# Patient Record
Sex: Female | Born: 1955 | Hispanic: No | Marital: Married | Smoking: Never smoker
Health system: Southern US, Community
[De-identification: ages and names within clinical notes are randomized; demographics above are authoritative.]

## PROBLEM LIST (undated history)

## (undated) DIAGNOSIS — C801 Malignant (primary) neoplasm, unspecified: Secondary | ICD-10-CM

## (undated) DIAGNOSIS — M1990 Unspecified osteoarthritis, unspecified site: Secondary | ICD-10-CM

## (undated) DIAGNOSIS — I10 Essential (primary) hypertension: Secondary | ICD-10-CM

## (undated) DIAGNOSIS — M797 Fibromyalgia: Secondary | ICD-10-CM

## (undated) DIAGNOSIS — E039 Hypothyroidism, unspecified: Secondary | ICD-10-CM

## (undated) DIAGNOSIS — I4891 Unspecified atrial fibrillation: Secondary | ICD-10-CM

## (undated) DIAGNOSIS — D649 Anemia, unspecified: Secondary | ICD-10-CM

## (undated) HISTORY — PX: DILATION AND CURETTAGE OF UTERUS: SHX78

## (undated) HISTORY — PX: WISDOM TOOTH EXTRACTION: SHX21

## (undated) HISTORY — PX: OTHER SURGICAL HISTORY: SHX169

## (undated) HISTORY — PX: COLONOSCOPY: SHX174

## (undated) HISTORY — DX: Unspecified atrial fibrillation: I48.91

---

## 2001-10-31 ENCOUNTER — Encounter: Payer: Self-pay | Admitting: Obstetrics and Gynecology

## 2001-10-31 ENCOUNTER — Ambulatory Visit (HOSPITAL_COMMUNITY): Admission: RE | Admit: 2001-10-31 | Discharge: 2001-10-31 | Payer: Self-pay | Admitting: Obstetrics and Gynecology

## 2002-02-26 ENCOUNTER — Other Ambulatory Visit: Admission: RE | Admit: 2002-02-26 | Discharge: 2002-02-26 | Payer: Self-pay | Admitting: Obstetrics and Gynecology

## 2002-04-04 ENCOUNTER — Ambulatory Visit (HOSPITAL_COMMUNITY): Admission: RE | Admit: 2002-04-04 | Discharge: 2002-04-04 | Payer: Self-pay | Admitting: Obstetrics and Gynecology

## 2002-04-04 ENCOUNTER — Encounter (INDEPENDENT_AMBULATORY_CARE_PROVIDER_SITE_OTHER): Payer: Self-pay | Admitting: *Deleted

## 2007-06-14 ENCOUNTER — Encounter: Admission: RE | Admit: 2007-06-14 | Discharge: 2007-06-14 | Payer: Self-pay | Admitting: Family Medicine

## 2007-12-20 ENCOUNTER — Encounter: Admission: RE | Admit: 2007-12-20 | Discharge: 2007-12-20 | Payer: Self-pay | Admitting: Orthopedic Surgery

## 2008-11-04 ENCOUNTER — Encounter: Admission: RE | Admit: 2008-11-04 | Discharge: 2008-11-04 | Payer: Self-pay | Admitting: Gastroenterology

## 2008-11-12 ENCOUNTER — Ambulatory Visit: Payer: Self-pay | Admitting: Gastroenterology

## 2008-12-29 ENCOUNTER — Encounter (INDEPENDENT_AMBULATORY_CARE_PROVIDER_SITE_OTHER): Payer: Self-pay | Admitting: Interventional Radiology

## 2008-12-29 ENCOUNTER — Ambulatory Visit (HOSPITAL_COMMUNITY): Admission: RE | Admit: 2008-12-29 | Discharge: 2008-12-29 | Payer: Self-pay | Admitting: Gastroenterology

## 2009-01-12 ENCOUNTER — Ambulatory Visit: Payer: Self-pay | Admitting: Gastroenterology

## 2009-11-23 ENCOUNTER — Encounter: Admission: RE | Admit: 2009-11-23 | Discharge: 2009-11-23 | Payer: Self-pay | Admitting: Family Medicine

## 2010-09-25 ENCOUNTER — Encounter: Payer: Self-pay | Admitting: Gastroenterology

## 2010-09-25 ENCOUNTER — Encounter: Payer: Self-pay | Admitting: Family Medicine

## 2010-12-14 LAB — CBC
HCT: 41.8 % (ref 36.0–46.0)
Hemoglobin: 13.7 g/dL (ref 12.0–15.0)
MCHC: 32.9 g/dL (ref 30.0–36.0)
MCV: 88.1 fL (ref 78.0–100.0)
RDW: 13.9 % (ref 11.5–15.5)

## 2011-01-20 NOTE — Op Note (Signed)
   NAME:  Whitney Daniel, Whitney Daniel                      ACCOUNT NO.:  [id number]   MEDICAL RECORD NO.:  [id number]                   PATIENT TYPE:  AMB   LOCATION:  [facility]C                                  FACILITY:  [facility]   PHYSICIAN:  Sheronette A. Cherly Hensen, M.D.         DATE OF BIRTH:  July 22, 1956   DATE OF PROCEDURE:  04/04/2002  DATE OF DISCHARGE:  04/04/2002                                 OPERATIVE REPORT   PREOPERATIVE DIAGNOSIS:  Menorrhagia and endometrial mass.   POSTOPERATIVE DIAGNOSIS:  1. Menorrhagia.  2. Submucosal fibroid.  3. Endometrial polyp.   PROCEDURE:  Diagnostic hysteroscopy, resection of endometrial polyp and  submucosal fibroid.  Dilation and curettage.   SURGEON:  Sheronette A. Cherly Hensen, M.D.   ANESTHESIA:  General.   DESCRIPTION OF PROCEDURE:  Under adequate anesthesia, the patient was placed  in the dorsal lithotomy position.  She was sterilely prepped and draped in  the usual fashion.  The bladder was catheterized of a moderate amount of  urine.  Examination under anesthesia revealed an anteverted average size  uterus.  No unusual masses could be appreciated.  Bivalve speculum was  placed in the vagina.  Cervix was noted to be parous.  No vaginal lesions  were noted.  The anterior lip of the cervix was grasped with a single-tooth  tenaculum.  The cervix was then serially dilated with #31 Shawnie Pons dilators.  A  resectoscope was introduced into the peritoneal cavity without incident.  Inspection of the endometrial cavity was notable for both tuberosities being  seen.  The posterior aspect of the endometrial cavity had thickening along  with a polypoid lesion on the left posterior as well as the lesion  consistent with a submucosal fibroid on the right.  Both items were resected  using a single loop and the anterior area of the endometrial cavity was  inspected, both thickened without any other defined polyps.  The area,  however, was densely resected.  The tissues  were removed.  The resectoscope  was removed.  The cavity was then curetted of a large amount of tissue, and  the resectoscope was reinserted.  The cavity was inspected.  No other  lesions were noted at which point all instruments were then removed from the  vagina.  Specimen labeled resected items as well as endometrial curetting  was sent to pathology.  Estimated blood loss was minimal.  Fluid deficit was  150 cc.  Complications were none.  The patient tolerated the procedure and  was transferred to the recovery room in stable condition.                                               Sheronette A. Cherly Hensen, M.D.    SAC/MEDQ  D:  04/04/2002  T:  04/10/2002  Job:  [id number]

## 2011-12-25 ENCOUNTER — Emergency Department (HOSPITAL_COMMUNITY)
Admission: EM | Admit: 2011-12-25 | Discharge: 2011-12-25 | Disposition: A | Discharge status: Home / Self Care | Payer: BC Managed Care – PPO | Attending: Emergency Medicine | Admitting: Emergency Medicine

## 2011-12-25 ENCOUNTER — Encounter (HOSPITAL_COMMUNITY): Payer: Self-pay

## 2011-12-25 ENCOUNTER — Emergency Department (HOSPITAL_COMMUNITY): Payer: BC Managed Care – PPO

## 2011-12-25 DIAGNOSIS — I4460 Unspecified fascicular block: Secondary | ICD-10-CM | POA: Insufficient documentation

## 2011-12-25 DIAGNOSIS — R51 Headache: Secondary | ICD-10-CM | POA: Insufficient documentation

## 2011-12-25 DIAGNOSIS — R0789 Other chest pain: Secondary | ICD-10-CM | POA: Insufficient documentation

## 2011-12-25 DIAGNOSIS — R0602 Shortness of breath: Secondary | ICD-10-CM | POA: Insufficient documentation

## 2011-12-25 HISTORY — DX: Malignant (primary) neoplasm, unspecified: C80.1

## 2011-12-25 HISTORY — DX: Essential (primary) hypertension: I10

## 2011-12-25 LAB — POCT I-STAT, CHEM 8
BUN: 5 mg/dL — ABNORMAL LOW (ref 6–23)
Calcium, Ion: 1.18 mmol/L (ref 1.12–1.32)
Chloride: 101 meq/L (ref 96–112)
Creatinine, Ser: 0.7 mg/dL (ref 0.50–1.10)
Glucose, Bld: 94 mg/dL (ref 70–99)
HCT: 46 % (ref 36.0–46.0)
Hemoglobin: 15.6 g/dL — ABNORMAL HIGH (ref 12.0–15.0)
Potassium: 3.1 meq/L — ABNORMAL LOW (ref 3.5–5.1)
Sodium: 141 meq/L (ref 135–145)
TCO2: 28 mmol/L (ref 0–100)

## 2011-12-25 LAB — POCT I-STAT TROPONIN I: Troponin i, poc: 0 ng/mL (ref 0.00–0.08)

## 2011-12-25 MED ORDER — NITROGLYCERIN 0.4 MG SL SUBL: 0.4000 mg | SUBLINGUAL_TABLET | SUBLINGUAL | Status: DC | PRN

## 2011-12-25 MED ORDER — MORPHINE SULFATE 4 MG/ML IJ SOLN
4.0000 mg | Freq: Once | INTRAMUSCULAR | Status: AC
Start: 2011-12-25 — End: 2011-12-25
  Administered 2011-12-25: 4 mg via INTRAVENOUS
  Filled 2011-12-25: qty 1

## 2011-12-25 NOTE — ED Provider Notes (Signed)
History     CSN: [id number]  Arrival date & time 12/25/11  2045   First MD Initiated Contact with Patient 12/25/11 2059      Chief Complaint  Patient presents with  . Chest Pain    (Consider location/radiation/quality/duration/timing/severity/associated sxs/prior treatment) Patient is a 56 y.o. female presenting with chest pain. The history is provided by the patient.  Chest Pain The chest pain began 12 - 24 hours ago. Chest pain occurs constantly. Progression since onset: waxing/waning. The pain is associated with stress. At its most intense, the pain is at 8/10. The pain is currently at 5/10. The severity of the pain is moderate. The quality of the pain is described as squeezing. The pain does not radiate. Chest pain is worsened by stress. Primary symptoms include shortness of breath. Pertinent negatives for primary symptoms include no fever, no fatigue, no syncope, no cough, no abdominal pain, no nausea, no vomiting and no dizziness.  Pertinent negatives for associated symptoms include no near-syncope and no weakness. She tried nitroglycerin and aspirin for the symptoms. Risk factors: HTN, FH of MI.  Her past medical history is significant for hypertension.  Pertinent negatives for past medical history include no CAD, no diabetes, no DVT, no hyperlipidemia, no MI and no PE.  Her family medical history is significant for early MI in family.  Procedure history is positive for stress echo.     Past Medical History  Diagnosis Date  . Hypertension   . Cancer     No past surgical history on file.  No family history on file.  History  Substance Use Topics  . Smoking status: Not on file  . Smokeless tobacco: Not on file  . Alcohol Use:     OB History    Grav Para Term Preterm Abortions TAB SAB Ect Mult Living                  Review of Systems  Constitutional: Negative for fever and fatigue.  HENT: Negative for neck pain.   Respiratory: Positive for chest tightness and  shortness of breath. Negative for cough.   Cardiovascular: Positive for chest pain. Negative for syncope and near-syncope.  Gastrointestinal: Negative for nausea, vomiting, abdominal pain and diarrhea.  Skin: Negative for rash.  Neurological: Positive for headaches. Negative for dizziness and weakness.  All other systems reviewed and are negative.    Allergies  Codeine and Vicodin  Home Medications   Current Outpatient Rx  Name Route Sig Dispense Refill  . BIOTIN PO Oral Take 1 tablet by mouth daily.     Marland Kitchen GABAPENTIN 600 MG PO TABS Oral Take 600 mg by mouth 3 (three) times daily as needed. For nerve pain.    Marland Kitchen HYDROCHLOROTHIAZIDE 25 MG PO TABS Oral Take 25 mg by mouth daily.    Marland Kitchen LEVOTHYROXINE SODIUM 75 MCG PO TABS Oral Take 75 mcg by mouth daily.      BP 143/75  Temp(Src) 98.3 F (36.8 C) (Oral)  Resp 20  SpO2 98%  Physical Exam  Nursing note and vitals reviewed. Constitutional: She is oriented to person, place, and time. She appears well-developed and well-nourished.  HENT:  Head: Normocephalic and atraumatic.  Eyes: EOM are normal.  Neck: Normal range of motion.  Cardiovascular: Normal rate, regular rhythm, normal heart sounds and intact distal pulses.   Pulmonary/Chest: Effort normal and breath sounds normal. No respiratory distress.  Abdominal: Soft. There is no tenderness.  Musculoskeletal: She exhibits no edema and no tenderness.  Neurological: She is alert and oriented to person, place, and time.  Skin: Skin is warm and dry.  Psychiatric: She has a normal mood and affect.    ED Course  Procedures (including critical care time)  Date: 12/25/2011  Rate: 70  Rhythm: normal sinus rhythm  QRS Axis: indeterminate  Intervals: normal  ST/T Wave abnormalities: normal  Conduction Disutrbances:left anterior fascicular block  Narrative Interpretation:   Old EKG Reviewed: none available   Labs Reviewed  POCT I-STAT, CHEM 8 - Abnormal; Notable for the following:      Potassium 3.1 (*)    BUN 5 (*)    Hemoglobin 15.6 (*)    All other components within normal limits  POCT I-STAT TROPONIN I   Dg Chest 2 View  12/25/2011  *RADIOLOGY REPORT*  Clinical Data: Left-sided chest pain for several hours.  CHEST - 2 VIEW  Comparison: 06/14/2007  Findings: Normal heart size and pulmonary vascularity.  Mild hyperinflation.  No focal airspace consolidation in the lungs.  No blunting of costophrenic angles.  No pneumothorax.  No significant changes since the previous study.  IMPRESSION: No evidence of active pulmonary disease.  Original Report Authenticated By: Marlon Pel, M.D.     1. Atypical chest pain       MDM  Patient is an [place] American female in her 74s who presents with chest pain. She states the pain began last night when she's having argument with her husband. It was present when she went to sleep that she was able to go to bed and sleep normally. She notes pain was still present when she woke up this morning that she was able to do her activities. Pain became significantly more severe approximately 6:30 this evening while she was talking on the phone. At this point she was very concerned and called 911. She was given aspirin and nitroglycerin by EMS without relief. Pain had largely improved prior to their arrival.  On my evaluation patient rates pain 5/10. She describes a squeezing central chest pain. It is associated with mild shortness of breath but no diaphoresis nausea or vomiting. Pain is not exertional or pleuritic. She's had no lower extremity swelling or pain. She does have a history of hypertension, but no other cardiac risk factors. There is a history of early MI in her sister who had a stent placed at the age of 85. Pt does report a normal stress test done at [place] 4-5 years ago.   Here EKG was sinus rhythm without acute ischemic change. Patient's pain completely resolved after single dose of morphine. Patient states she felt  completely better. Her chest x-ray was unremarkable and enzymes were normal. Given the constant nature of her pain for greater than 24 hours feel comfortable single set of cardiac enzymes. Discussed need for outpatient followup with her primary provider for possible outpatient stress test.      Donnamarie Poag, MD 12/25/11 2328

## 2011-12-25 NOTE — ED Notes (Signed)
Pt states that last night she was upset about something (pt is vague and does not wish to elaborate) and states that her chest began hurting. She stated that today, throughout the entire day, she has had a headache and intermittent chest pain. Pt states she has been upset all day, part of the problem is that she has cancer and is scared she will have her right leg amputated.

## 2011-12-25 NOTE — ED Notes (Signed)
Patient returned from X-ray 

## 2011-12-25 NOTE — ED Notes (Signed)
Patient transported to X-ray 

## 2011-12-25 NOTE — Discharge Instructions (Signed)
It is important to follow up with your regular doctor for possible outpatient stress test.

## 2011-12-25 NOTE — ED Notes (Signed)
PT reports she is feeling much better and chest pain is gone. Phlebotomy called regarding delay in blood draw.

## 2011-12-25 NOTE — ED Notes (Signed)
Per ems- pt c/o squeezing central CP beginning at 6:30pm, radiating to left arm and associated with SOB. Pt had total relief of CP with 1 nitro. Pt NSR on 12lead. Pt given 324asa.

## 2011-12-26 NOTE — ED Provider Notes (Signed)
I saw and evaluated the patient, reviewed the resident's note and I agree with the findings and plan.  Patient chest pain. Atypical for ACS given constant duration since yesterday evening. EKG is non-provocative. Patient is in no acute distress on exam. Lungs are clear heart is regular without murmurs. Lower extremities symmetric as compared to each other. No calf tenderness. Negative Homan's. No palpable cords. Doubt pulmonary embolism. Feel that patient can be discharged at this time. Discussed with patient that she should get a stress test but feel that this can be arranged by her primary care Dr.  Raeford Razor, MD 12/26/11 [phone]

## 2012-07-10 DIAGNOSIS — C492 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, including hip: Secondary | ICD-10-CM | POA: Insufficient documentation

## 2012-11-28 ENCOUNTER — Encounter (HOSPITAL_COMMUNITY): Payer: Self-pay | Admitting: Radiology

## 2012-11-28 ENCOUNTER — Emergency Department (HOSPITAL_COMMUNITY)
Admission: EM | Admit: 2012-11-28 | Discharge: 2012-11-28 | Disposition: A | Discharge status: Home / Self Care | Payer: BC Managed Care – PPO | Attending: Emergency Medicine | Admitting: Emergency Medicine

## 2012-11-28 ENCOUNTER — Emergency Department (HOSPITAL_COMMUNITY): Payer: BC Managed Care – PPO

## 2012-11-28 DIAGNOSIS — R079 Chest pain, unspecified: Secondary | ICD-10-CM | POA: Insufficient documentation

## 2012-11-28 DIAGNOSIS — Z79899 Other long term (current) drug therapy: Secondary | ICD-10-CM | POA: Insufficient documentation

## 2012-11-28 DIAGNOSIS — R5381 Other malaise: Secondary | ICD-10-CM | POA: Insufficient documentation

## 2012-11-28 DIAGNOSIS — Z859 Personal history of malignant neoplasm, unspecified: Secondary | ICD-10-CM | POA: Insufficient documentation

## 2012-11-28 DIAGNOSIS — I10 Essential (primary) hypertension: Secondary | ICD-10-CM | POA: Insufficient documentation

## 2012-11-28 DIAGNOSIS — R209 Unspecified disturbances of skin sensation: Secondary | ICD-10-CM | POA: Insufficient documentation

## 2012-11-28 DIAGNOSIS — R61 Generalized hyperhidrosis: Secondary | ICD-10-CM | POA: Insufficient documentation

## 2012-11-28 DIAGNOSIS — R5383 Other fatigue: Secondary | ICD-10-CM | POA: Insufficient documentation

## 2012-11-28 LAB — COMPREHENSIVE METABOLIC PANEL
ALT: 52 U/L — ABNORMAL HIGH (ref 0–35)
CO2: 26 mEq/L (ref 19–32)
Calcium: 9.6 mg/dL (ref 8.4–10.5)
Creatinine, Ser: 0.74 mg/dL (ref 0.50–1.10)
GFR calc Af Amer: >90 mL/min (ref >90)
GFR calc non Af Amer: >90 mL/min (ref >90)
Glucose, Bld: 87 mg/dL (ref 70–99)
Sodium: 141 mEq/L (ref 135–145)
Total Bilirubin: 0.3 mg/dL (ref 0.3–1.2)

## 2012-11-28 LAB — CBC
Hemoglobin: 14.1 g/dL (ref 12.0–15.0)
MCH: 28.8 pg (ref 26.0–34.0)
MCV: 84.7 fL (ref 78.0–100.0)
RBC: 4.89 MIL/uL (ref 3.87–5.11)

## 2012-11-28 LAB — POCT I-STAT TROPONIN I: Troponin i, poc: 0 ng/mL (ref 0.00–0.08)

## 2012-11-28 MED ORDER — MORPHINE SULFATE 4 MG/ML IJ SOLN: 4.0000 mg | Freq: Once | INTRAMUSCULAR | Status: DC

## 2012-11-28 MED ORDER — ONDANSETRON HCL 4 MG/2ML IJ SOLN
4.0000 mg | Freq: Once | INTRAMUSCULAR | Status: AC
  Administered 2012-11-28: 4 mg via INTRAVENOUS
  Filled 2012-11-28: qty 2

## 2012-11-28 NOTE — ED Provider Notes (Signed)
  Medical screening examination/treatment/procedure(s) were performed by non-physician practitioner and as supervising physician I was immediately available for consultation/collaboration.    Gerhard Munch, MD 11/28/12 [phone]

## 2012-11-28 NOTE — ED Provider Notes (Signed)
History     CSN: [id number]  Arrival date & time 11/28/12  1049   First MD Initiated Contact with Patient 11/28/12 1101      Chief Complaint  Patient presents with  . Chest Pain    (Consider location/radiation/quality/duration/timing/severity/associated sxs/prior treatment) HPI Comments: 57 y.o. Female presents complaining about chest pain squeezing her chest for more than 20 minutes and it never subsized. Radiating up her left jaw line and down her left arm. Pt states she felt very weak. Pt states she called EMS at work. Pt states it was 10/10 at work. 5/10 after nitro.   Pt received nitro on the ambulance and it helped relieve the pain. 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  Pt admits shortness of breath, nausea.  Pt denies vomiting, visual disturbances, difficulty speaking, difficulty ambulating, loss of balance, or dizziness.  PMHx of HTN.   Previous episode where she was seen in the ED 12/25/2011. Symptoms resolved and pt was discharged to follow up and have stress test. She did no follow up. Pt states she did have a stress test about 5 years ago that was negative.   Maternal grandmother died of MI, mom died of CVA, and sister passed away at a young age (28s) of an MI as well.   Patient is a 57 y.o. female presenting with chest pain.  Chest Pain Associated symptoms: diaphoresis, numbness and weakness   Associated symptoms: no dizziness, no fever, no headache, no nausea, no palpitations, no shortness of breath and not vomiting     Past Medical History  Diagnosis Date  . Hypertension   . Cancer     History reviewed. No pertinent past surgical history.  History reviewed. No pertinent family history.  History  Substance Use Topics  . Smoking status: Not on file  . Smokeless tobacco: Not on file  . Alcohol Use: No    OB History   Grav Para Term Preterm Abortions TAB SAB Ect Mult Living                  Review of Systems   Constitutional: Positive for diaphoresis. Negative for fever.  HENT: Negative for neck pain and neck stiffness.   Eyes: Negative for visual disturbance.  Respiratory: Negative for apnea, chest tightness and shortness of breath.   Cardiovascular: Positive for chest pain. Negative for palpitations.  Gastrointestinal: Negative for nausea, vomiting, diarrhea and constipation.  Genitourinary: Negative for dysuria.  Musculoskeletal: Negative for gait problem.  Neurological: Positive for weakness and numbness. Negative for dizziness, light-headedness and headaches.       Left sided headache, numbness, and weakness.    Allergies  Codeine and Vicodin  Home Medications   Current Outpatient Rx  Name  Route  Sig  Dispense  Refill  . albuterol (PROVENTIL HFA;VENTOLIN HFA) 108 (90 BASE) MCG/ACT inhaler   Inhalation   Inhale 2 puffs into the lungs every 6 (six) hours as needed for wheezing.         Marland Kitchen BIOTIN PO   Oral   Take 1 tablet by mouth daily.          Marland Kitchen EVENING PRIMROSE OIL PO   Oral   Take 1 capsule by mouth daily.         Marland Kitchen gabapentin (NEURONTIN) 600 MG tablet   Oral   Take 600 mg by mouth 3 (three) times daily as needed. For nerve pain.         Marland Kitchen  hydrochlorothiazide (HYDRODIURIL) 25 MG tablet   Oral   Take 25 mg by mouth daily.         Marland Kitchen levothyroxine (SYNTHROID, LEVOTHROID) 75 MCG tablet   Oral   Take 75 mcg by mouth daily.         . Multiple Vitamin (MULTIVITAMIN WITH MINERALS) TABS   Oral   Take 1 tablet by mouth daily.           BP 132/90  Temp(Src) 98.5 F (36.9 C) (Oral)  Resp 11  Ht 5\' 8"  (1.727 m)  Wt 217 lb (98.431 kg)  BMI 33 kg/m2  Physical Exam  Nursing note and vitals reviewed. Constitutional: She is oriented to person, place, and time. She appears well-developed and well-nourished. No distress.  HENT:  Head: Normocephalic and atraumatic.  Eyes: Conjunctivae and EOM are normal. Pupils are equal, round, and reactive to light.  Neck:  Normal range of motion. Neck supple.  No meningeal signs  Cardiovascular: Normal rate, regular rhythm, normal heart sounds and intact distal pulses.  Exam reveals no gallop and no friction rub.   No murmur heard. Pulmonary/Chest: Effort normal and breath sounds normal. No respiratory distress. She has no wheezes.  Abdominal: Soft. Bowel sounds are normal. There is no tenderness.  Musculoskeletal: Normal range of motion. She exhibits no edema and no tenderness.  5/5 strength throughout, good grip strength bilaterally. No arm drift.   Neurological: She is alert and oriented to person, place, and time. No cranial nerve deficit.  No focal deficits. Sensation to light touch intact.   Skin: Skin is warm and dry. She is not diaphoretic. No erythema.  Psychiatric: She has a normal mood and affect.    ED Course  Procedures (including critical care time)   Date: 11/28/2012  Rate: 55  Rhythm: sinus rhythm  QRS Axis: left axis deviation  Intervals: normal  ST/T Wave abnormalities: normal  Conduction Disutrbances: poor R wave progression  Narrative Interpretation: left anterior fascicular block  Old EKG Reviewed: 12/25/2011, left anterior fascicular block, rate = 70  Labs Reviewed  COMPREHENSIVE METABOLIC PANEL - Abnormal; Notable for the following:    Albumin 3.3 (*)    AST 76 (*)    ALT 52 (*)    All other components within normal limits  CBC  POCT I-STAT TROPONIN I   Dg Chest 2 View  11/28/2012  *RADIOLOGY REPORT*  Clinical Data: Chest pain.  CHEST - 2 VIEW  Comparison: PA and lateral chest 12/25/2011.  Findings: Lungs are clear.  Heart size is normal.  No pneumothorax or pleural effusion.  IMPRESSION: No acute disease.   Original Report Authenticated By: Holley Dexter, M.D.      1. Chest pain at rest       MDM  Rule out ACS given pt hx and family hx. Pt comfortable in ED and does not wish any pain meds at this time. Assured pt to let us know if she needs pain meds. Will  provide Zofran to alleviate some minor nausea. PE unimpressive at this time. Pt is stable and in NAD. Will order CP protocol and re-evaluate.   Labs unconcerning. Troponin negative. Pt has been pain free throughout ED course. Discussed EKG with pt and impressed importance of follow up with primary care, cardiologist, and possible stress test.  At this time there does not appear to be any evidence of an acute emergency medical condition and the patient appears stable for discharge.    Pt has been advised  to return to the ED is CP becomes exertional, associated with diaphoresis or nausea, radiates to left jaw/arm, worsens or becomes concerning in any way. Pt appears reliable for follow up and is agreeable to discharge.   Case has been discussed with Dr. Jeraldine Loots who agrees with the above plan to discharge.       Glade Nurse, PA-C 11/28/12 1539

## 2012-11-28 NOTE — ED Notes (Signed)
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

## 2012-11-28 NOTE — ED Notes (Signed)
Pt presents with CP X 1 hour while at sitting at desk working. Pt describes as "squeezing" mid sternal radiating to left arm.   pt received nitro x 2 asa 324 mg.

## 2014-03-12 ENCOUNTER — Other Ambulatory Visit: Payer: Self-pay | Admitting: Nurse Practitioner

## 2014-03-12 DIAGNOSIS — B182 Chronic viral hepatitis C: Secondary | ICD-10-CM

## 2014-03-20 ENCOUNTER — Ambulatory Visit
Admission: RE | Admit: 2014-03-20 | Discharge: 2014-03-20 | Disposition: A | Discharge status: Home / Self Care | Payer: BC Managed Care – PPO | Source: Ambulatory Visit | Attending: Nurse Practitioner | Admitting: Nurse Practitioner

## 2014-03-20 DIAGNOSIS — B182 Chronic viral hepatitis C: Secondary | ICD-10-CM

## 2014-03-24 ENCOUNTER — Other Ambulatory Visit: Payer: Self-pay | Admitting: Nurse Practitioner

## 2014-03-24 DIAGNOSIS — R772 Abnormality of alphafetoprotein: Secondary | ICD-10-CM

## 2014-03-31 ENCOUNTER — Ambulatory Visit
Admission: RE | Admit: 2014-03-31 | Discharge: 2014-03-31 | Disposition: A | Discharge status: Home / Self Care | Payer: BC Managed Care – PPO | Source: Ambulatory Visit | Attending: Nurse Practitioner | Admitting: Nurse Practitioner

## 2014-03-31 DIAGNOSIS — R772 Abnormality of alphafetoprotein: Secondary | ICD-10-CM

## 2014-03-31 MED ORDER — GADOXETATE DISODIUM 0.25 MMOL/ML IV SOLN
9.0000 mL | Freq: Once | INTRAVENOUS | Status: AC | PRN
  Administered 2014-03-31: 9 mL via INTRAVENOUS

## 2014-07-08 ENCOUNTER — Other Ambulatory Visit: Payer: Self-pay | Admitting: Obstetrics and Gynecology

## 2014-07-08 NOTE — Patient Instructions (Addendum)
   Your procedure is scheduled on:  Wednesday, Nov 11  Enter through the [employer] [facility] at:  9 AM Pick up the phone at the desk and dial [phone] and inform us of your arrival.  Please call this number if you have any problems the morning of surgery: [phone]  Remember: Do not eat or drink after midnight: Tuesday Take these medicines the morning of surgery with a SIP OF WATER: HCTZ, levothyroxine.  Bring albuterol inhaler with you on day of surgery.  Do not wear jewelry, make-up, or FINGER nail polish No metal in your hair or on your body. Do not wear lotions, powders, perfumes.  You may wear deodorant.  Do not bring valuables to the hospital. Contacts, dentures or bridgework may not be worn into surgery.  Patients discharged on the day of surgery will not be allowed to drive home.  Home with Husband Bobbie cell [phone].

## 2014-07-09 ENCOUNTER — Encounter (HOSPITAL_COMMUNITY)
Admission: RE | Admit: 2014-07-09 | Discharge: 2014-07-09 | Disposition: A | Discharge status: Home / Self Care | Payer: BC Managed Care – PPO | Source: Ambulatory Visit | Attending: Obstetrics and Gynecology | Admitting: Obstetrics and Gynecology

## 2014-07-09 ENCOUNTER — Encounter (HOSPITAL_COMMUNITY): Payer: Self-pay

## 2014-07-09 DIAGNOSIS — Z01812 Encounter for preprocedural laboratory examination: Secondary | ICD-10-CM | POA: Diagnosis present

## 2014-07-09 HISTORY — DX: Hypothyroidism, unspecified: E03.9

## 2014-07-09 HISTORY — DX: Anemia, unspecified: D64.9

## 2014-07-09 HISTORY — DX: Unspecified osteoarthritis, unspecified site: M19.90

## 2014-07-09 LAB — BASIC METABOLIC PANEL
Anion gap: 12 (ref 5–15)
BUN: 13 mg/dL (ref 6–23)
CHLORIDE: 100 meq/L (ref 96–112)
CO2: 27 mEq/L (ref 19–32)
Calcium: 10.4 mg/dL (ref 8.4–10.5)
Creatinine, Ser: 0.91 mg/dL (ref 0.50–1.10)
GFR calc Af Amer: 80 mL/min — ABNORMAL LOW (ref >90)
GFR, EST NON AFRICAN AMERICAN: 69 mL/min — AB (ref >90)
GLUCOSE: 116 mg/dL — AB (ref 70–99)
POTASSIUM: 3.5 meq/L — AB (ref 3.7–5.3)
SODIUM: 139 meq/L (ref 137–147)

## 2014-07-09 LAB — CBC
HCT: 38.2 % (ref 36.0–46.0)
HEMOGLOBIN: 12.6 g/dL (ref 12.0–15.0)
MCH: 28.2 pg (ref 26.0–34.0)
MCHC: 33 g/dL (ref 30.0–36.0)
MCV: 85.5 fL (ref 78.0–100.0)
PLATELETS: 274 10*3/uL (ref 150–400)
RBC: 4.47 MIL/uL (ref 3.87–5.11)
RDW: 13.5 % (ref 11.5–15.5)
WBC: 6.3 10*3/uL (ref 4.0–10.5)

## 2014-07-15 ENCOUNTER — Ambulatory Visit (HOSPITAL_COMMUNITY): Payer: BC Managed Care – PPO | Admitting: Anesthesiology

## 2014-07-15 ENCOUNTER — Encounter (HOSPITAL_COMMUNITY): Payer: Self-pay | Admitting: *Deleted

## 2014-07-15 ENCOUNTER — Ambulatory Visit (HOSPITAL_COMMUNITY)
Admission: RE | Admit: 2014-07-15 | Discharge: 2014-07-15 | Disposition: A | Discharge status: Home / Self Care | Payer: BC Managed Care – PPO | Source: Ambulatory Visit | Attending: Obstetrics and Gynecology | Admitting: Obstetrics and Gynecology

## 2014-07-15 ENCOUNTER — Encounter (HOSPITAL_COMMUNITY)
Admission: RE | Disposition: A | Discharge status: Home / Self Care | Payer: Self-pay | Source: Ambulatory Visit | Attending: Obstetrics and Gynecology

## 2014-07-15 DIAGNOSIS — R9389 Abnormal findings on diagnostic imaging of other specified body structures: Secondary | ICD-10-CM

## 2014-07-15 DIAGNOSIS — I10 Essential (primary) hypertension: Secondary | ICD-10-CM | POA: Insufficient documentation

## 2014-07-15 DIAGNOSIS — N952 Postmenopausal atrophic vaginitis: Secondary | ICD-10-CM | POA: Insufficient documentation

## 2014-07-15 DIAGNOSIS — M1990 Unspecified osteoarthritis, unspecified site: Secondary | ICD-10-CM | POA: Insufficient documentation

## 2014-07-15 DIAGNOSIS — N840 Polyp of corpus uteri: Secondary | ICD-10-CM | POA: Diagnosis present

## 2014-07-15 DIAGNOSIS — E039 Hypothyroidism, unspecified: Secondary | ICD-10-CM | POA: Diagnosis not present

## 2014-07-15 DIAGNOSIS — D649 Anemia, unspecified: Secondary | ICD-10-CM | POA: Diagnosis not present

## 2014-07-15 DIAGNOSIS — Z885 Allergy status to narcotic agent status: Secondary | ICD-10-CM | POA: Insufficient documentation

## 2014-07-15 HISTORY — PX: DILATATION & CURRETTAGE/HYSTEROSCOPY WITH RESECTOCOPE: SHX5572

## 2014-07-15 SURGERY — DILATATION & CURETTAGE/HYSTEROSCOPY WITH RESECTOCOPE
Anesthesia: General | Site: Vagina

## 2014-07-15 MED ORDER — FENTANYL CITRATE 0.05 MG/ML IJ SOLN
INTRAMUSCULAR | Status: DC | PRN
  Administered 2014-07-15 (×2): 25 ug via INTRAVENOUS
  Administered 2014-07-15: 50 ug via INTRAVENOUS

## 2014-07-15 MED ORDER — FENTANYL CITRATE 0.05 MG/ML IJ SOLN
INTRAMUSCULAR | Status: AC
  Administered 2014-07-15: 25 ug via INTRAVENOUS
  Filled 2014-07-15: qty 2

## 2014-07-15 MED ORDER — DEXAMETHASONE SODIUM PHOSPHATE 10 MG/ML IJ SOLN
INTRAMUSCULAR | Status: DC | PRN
  Administered 2014-07-15: 10 mg via INTRAVENOUS

## 2014-07-15 MED ORDER — LIDOCAINE HCL (CARDIAC) 20 MG/ML IV SOLN
INTRAVENOUS | Status: AC
  Filled 2014-07-15: qty 5

## 2014-07-15 MED ORDER — ONDANSETRON HCL 4 MG/2ML IJ SOLN
INTRAMUSCULAR | Status: DC | PRN
  Administered 2014-07-15: 4 mg via INTRAVENOUS

## 2014-07-15 MED ORDER — SCOPOLAMINE 1 MG/3DAYS TD PT72
1.0000 | MEDICATED_PATCH | Freq: Once | TRANSDERMAL | Status: DC
  Administered 2014-07-15: 1.5 mg via TRANSDERMAL

## 2014-07-15 MED ORDER — ONDANSETRON HCL 4 MG/2ML IJ SOLN
INTRAMUSCULAR | Status: AC
  Filled 2014-07-15: qty 2

## 2014-07-15 MED ORDER — IBUPROFEN 800 MG PO TABS: 800.0000 mg | ORAL_TABLET | Freq: Three times a day (TID) | ORAL | Status: DC | PRN

## 2014-07-15 MED ORDER — SCOPOLAMINE 1 MG/3DAYS TD PT72
MEDICATED_PATCH | TRANSDERMAL | Status: AC
  Filled 2014-07-15: qty 1

## 2014-07-15 MED ORDER — FENTANYL CITRATE 0.05 MG/ML IJ SOLN
INTRAMUSCULAR | Status: AC
  Filled 2014-07-15: qty 2

## 2014-07-15 MED ORDER — PROMETHAZINE HCL 25 MG/ML IJ SOLN
6.2500 mg | INTRAMUSCULAR | Status: DC | PRN
Start: 2014-07-15 — End: 2014-07-15

## 2014-07-15 MED ORDER — FENTANYL CITRATE 0.05 MG/ML IJ SOLN
25.0000 ug | INTRAMUSCULAR | Status: DC | PRN
  Administered 2014-07-15 (×3): 25 ug via INTRAVENOUS

## 2014-07-15 MED ORDER — MIDAZOLAM HCL 2 MG/2ML IJ SOLN
INTRAMUSCULAR | Status: AC
  Filled 2014-07-15: qty 2

## 2014-07-15 MED ORDER — TRAMADOL HCL 50 MG PO TABS
50.0000 mg | ORAL_TABLET | Freq: Once | ORAL | Status: AC
  Administered 2014-07-15: 50 mg via ORAL

## 2014-07-15 MED ORDER — PROPOFOL 10 MG/ML IV BOLUS
INTRAVENOUS | Status: DC | PRN
  Administered 2014-07-15: 150 mg via INTRAVENOUS

## 2014-07-15 MED ORDER — MIDAZOLAM HCL 2 MG/2ML IJ SOLN: 0.5000 mg | Freq: Once | INTRAMUSCULAR | Status: DC | PRN

## 2014-07-15 MED ORDER — PROPOFOL 10 MG/ML IV EMUL
INTRAVENOUS | Status: AC
  Filled 2014-07-15: qty 20

## 2014-07-15 MED ORDER — DEXAMETHASONE SODIUM PHOSPHATE 10 MG/ML IJ SOLN
INTRAMUSCULAR | Status: AC
  Filled 2014-07-15: qty 1

## 2014-07-15 MED ORDER — LACTATED RINGERS IV SOLN
INTRAVENOUS | Status: DC
  Administered 2014-07-15: 10:00:00 via INTRAVENOUS

## 2014-07-15 MED ORDER — KETOROLAC TROMETHAMINE 30 MG/ML IJ SOLN: 15.0000 mg | Freq: Once | INTRAMUSCULAR | Status: DC | PRN

## 2014-07-15 MED ORDER — GLYCINE 1.5 % IR SOLN
Status: DC | PRN
  Administered 2014-07-15: 3000 mL

## 2014-07-15 MED ORDER — MIDAZOLAM HCL 5 MG/5ML IJ SOLN
INTRAMUSCULAR | Status: DC | PRN
  Administered 2014-07-15: 2 mg via INTRAVENOUS

## 2014-07-15 MED ORDER — KETOROLAC TROMETHAMINE 30 MG/ML IJ SOLN
INTRAMUSCULAR | Status: AC
Start: 2014-07-15 — End: 2014-07-15
  Filled 2014-07-15: qty 1

## 2014-07-15 MED ORDER — KETOROLAC TROMETHAMINE 30 MG/ML IJ SOLN
INTRAMUSCULAR | Status: DC | PRN
  Administered 2014-07-15: 30 mg via INTRAVENOUS

## 2014-07-15 MED ORDER — MEPERIDINE HCL 25 MG/ML IJ SOLN: 6.2500 mg | INTRAMUSCULAR | Status: DC | PRN

## 2014-07-15 MED ORDER — LIDOCAINE HCL (CARDIAC) 20 MG/ML IV SOLN
INTRAVENOUS | Status: DC | PRN
  Administered 2014-07-15: 50 mg via INTRAVENOUS

## 2014-07-15 SURGICAL SUPPLY — 17 items
CANISTER SUCT 3000ML (MISCELLANEOUS) ×2 IMPLANT
CATH ROBINSON RED A/P 16FR (CATHETERS) ×2 IMPLANT
CLOTH BEACON ORANGE TIMEOUT ST (SAFETY) ×2 IMPLANT
CONTAINER PREFILL 10% NBF 60ML (FORM) ×4 IMPLANT
ELECT REM PT RETURN 9FT ADLT (ELECTROSURGICAL) ×2
ELECTRODE REM PT RTRN 9FT ADLT (ELECTROSURGICAL) ×1 IMPLANT
GLOVE BIOGEL PI IND STRL 7.0 (GLOVE) ×2 IMPLANT
GLOVE BIOGEL PI INDICATOR 7.0 (GLOVE) ×2
GLOVE ECLIPSE 6.5 STRL STRAW (GLOVE) ×2 IMPLANT
GOWN STRL REUS W/TWL LRG LVL3 (GOWN DISPOSABLE) ×4 IMPLANT
LOOP ANGLED CUTTING 22FR (CUTTING LOOP) ×1 IMPLANT
PACK VAGINAL MINOR WOMEN LF (CUSTOM PROCEDURE TRAY) ×2 IMPLANT
PAD OB MATERNITY 4.3X12.25 (PERSONAL CARE ITEMS) ×2 IMPLANT
SET TUBING HYSTEROSCOPY 2 NDL (TUBING) ×1 IMPLANT
TOWEL OR 17X24 6PK STRL BLUE (TOWEL DISPOSABLE) ×4 IMPLANT
TUBE HYSTEROSCOPY W Y-CONNECT (TUBING) ×2 IMPLANT
WATER STERILE IRR 1000ML POUR (IV SOLUTION) ×2 IMPLANT

## 2014-07-15 NOTE — Op Note (Signed)
Whitney Daniel, Whitney Daniel               ACCOUNT NO.:  [id number]  MEDICAL RECORD NO.:  [id number]  LOCATION:  [facility]                          FACILITY:  [facility]  PHYSICIAN:  Servando Salina, M.D.DATE OF BIRTH:  Jan 03, 1956  DATE OF PROCEDURE:  07/15/2014 DATE OF DISCHARGE:  07/15/2014                              OPERATIVE REPORT   PREOPERATIVE DIAGNOSES:  Endometrial mass, endometrial thickening on sonogram.  PROCEDURES:  Diagnostic hysteroscopy, hysteroscopic resection of endometrial polyp, dilation and curettage.  POSTOPERATIVE DIAGNOSES:  Endometrial polyp, endometrial thickening on ultrasound.  ANESTHESIA:  General.  SURGEON:  Servando Salina, M.D.  ASSISTANT:  None.  DESCRIPTION OF PROCEDURE:  Under adequate general anesthesia, the patient was placed in the dorsal lithotomy position.  She was sterilely prepped and draped in usual fashion.  The bladder was catheterized for moderate amount of urine.  Examination under anesthesia revealed anteverted uterus.  No adnexal masses could be appreciated.  A bivalve speculum was placed in the vagina.  The vagina was atrophic.  The cervix was grasped with a single-tooth tenaculum.  The cervix was then serially dilated up to #25 [facility] dilator.  A diagnostic hysteroscope was introduced into the uterine cavity.  Both tubal ostia could be seen, but they were sclerosed.  The endometrial cavity was pale, atrophic.  At the lower uterine segment, there was a polypoid lesion on the left lateral wall.  No other lesions were noted and the diagnostic hysteroscope was removed.  The cervix was then further dilated up to #31 [facility] dilator and a resectoscope with a single loop was inserted.  The polypoid lesion was removed in one swoop and the base was inspected. No other need for resection was done.  At that point, the hysteroscope was removed.  The cavity was gently curetted for scant amount of tissue.  All instruments were then removed from the  vagina.  SPECIMEN:  Labeled endometrial curetting with polyp sent to Pathology.  ESTIMATED BLOOD LOSS:  Less than 10 mL.  FLUID DEFICIT:  45 mL.  COUNTS:  Sponge and instrument counts x2 was correct.  COMPLICATION:  None.  The patient tolerated the procedure well, was transferred to recovery room in stable condition.     Servando Salina, M.D.     [facility]/MEDQ  D:  07/15/2014  T:  07/15/2014  Job:  [id number]

## 2014-07-15 NOTE — Transfer of Care (Signed)
Immediate Anesthesia Transfer of Care Note  Patient: Whitney Daniel  Procedure(s) Performed: Procedure(s): DILATATION & CURETTAGE/HYSTEROSCOPY WITH RESECTOCOPE (N/A)  Patient Location: PACU  Anesthesia Type:General  Level of Consciousness: awake  Airway & Oxygen Therapy: Patient Spontanous Breathing and Patient connected to nasal cannula oxygen  Post-op Assessment: Report given to PACU RN and Post -op Vital signs reviewed and stable  Post vital signs: stable  Complications: No apparent anesthesia complications

## 2014-07-15 NOTE — Brief Op Note (Signed)
07/15/2014  10:41 AM  PATIENT:  Whitney Daniel  58 y.o. female  PRE-OPERATIVE DIAGNOSIS:  Endometrial Mass, endometrial thickening on sonogram  POST-OPERATIVE DIAGNOSIS:  endometrial polyp, endometrial thickening on sonogram PROCEDURE:  Diagnostic hysteroscopy, hysteroscopic resection of endometrial polyp, dilation and curettage  SURGEON:  Surgeon(s) and Role:    * Pat Sires Clint Bolder, MD - Primary  PHYSICIAN ASSISTANT:   ASSISTANTS: none  ANESTHESIA:   general Findings; atropic endometrium, lus endom polyp EBL:  Total I/O In: 500 [I.V.:500] Out: 57 [Urine:50; Blood:5]  BLOOD ADMINISTERED:none  DRAINS: none   LOCAL MEDICATIONS USED:  NONE  SPECIMEN:  Source of Specimen:  endometrial polyp and emc  DISPOSITION OF SPECIMEN:  PATHOLOGY  COUNTS:  YES  TOURNIQUET:  * No tourniquets in log *  DICTATION: .Other Dictation: Dictation Number [id number]  PLAN OF CARE: Discharge to home after PACU  PATIENT DISPOSITION:  PACU - hemodynamically stable.   Delay start of Pharmacological VTE agent (>24hrs) due to surgical blood loss or risk of bleeding: no

## 2014-07-15 NOTE — Discharge Instructions (Signed)
DISCHARGE INSTRUCTIONS: HYSTEROSCOPY / ENDOMETRIAL ABLATION The following instructions have been prepared to help you care for yourself upon your return home.  Personal hygiene:  Use sanitary pads for vaginal drainage, not tampons.  Shower the day after your procedure.  NO tub baths, pools or Jacuzzis for 2-3 weeks.  Wipe front to back after using the bathroom.  Activity and limitations:  Do NOT drive or operate any equipment for 24 hours. The effects of anesthesia are still present and drowsiness may result.  Do NOT rest in bed all day.  Walking is encouraged.  Walk up and down stairs slowly.  You may resume your normal activity in one to two days or as indicated by your physician. Sexual activity: NO intercourse for at least 2 weeks after the procedure, or as indicated by your Doctor.  Diet: Eat a light meal as desired this evening. You may resume your usual diet tomorrow.  Return to Work: You may resume your work activities in one to two days or as indicated by Marine scientist.  What to expect after your surgery: Expect to have vaginal bleeding/discharge for 2-3 days and spotting for up to 10 days. It is not unusual to have soreness for up to 1-2 weeks. You may have a slight burning sensation when you urinate for the first day. Mild cramps may continue for a couple of days. You may have a regular period in 2-6 weeks.  NO IBUPROFEN (MOTRIN, ADVIL) OR ALEVE UNTIL 4:15 PM TODAY.    Call your doctor for any of the following:  Excessive vaginal bleeding or clotting, saturating and changing one pad every hour.  Inability to urinate 6 hours after discharge from hospital.  Pain not relieved by pain medication.  Fever of 100.4 F or greater.  Unusual vaginal discharge or odor.  Return to office _________________Call for an appointment ___________________ Patients signature: ______________________ Nurses signature ________________________  [facility]  [phone]

## 2014-07-15 NOTE — H&P (Signed)
Whitney Daniel is an 58 y.o. female. G8P5 MBF presents for surgical management of endom mass noted on sonohysterogram done for endometrial thickening  Pertinent Gynecological History: Menses: post-menopausal Bleeding: none Contraception: none DES exposure: denies Blood transfusions: none Sexually transmitted diseases: hx hepatitis C Previous GYN Procedures: DNC and hysteroscopy, resection of submucosal fibroid  Last mammogram: normal Date: 04/09/2014 Last pap: normal Date:8.28.13 OB History: G8, P5  Menstrual History: Menarche age: none No LMP recorded. Patient is postmenopausal.    Past Medical History  Diagnosis Date  . Hypertension   . Cancer   . SVD (spontaneous vaginal delivery)     x 5  . Hypothyroidism   . Osteoarthritis   . Anemia     Past Surgical History  Procedure Laterality Date  . Wisdom tooth extraction    . Dilation and curettage of uterus      polyps  . Colonoscopy    . Right thigh surgery      cancer    No family history on file.  Social History:  reports that she has never smoked. She has never used smokeless tobacco. She reports that she drinks alcohol. She reports that she does not use illicit drugs.  Allergies:  Allergies  Allergen Reactions  . Codeine Other (See Comments)    Patient states "she did not like the way codeine made her feel"    . Vicodin [Hydrocodone-Acetaminophen] Nausea And Vomiting    Doesn't like how it makes her feel but can take if needs to for pain    No prescriptions prior to admission    Review of Systems  All other systems reviewed and are negative.   There were no vitals taken for this visit. Physical Exam  Constitutional: She is oriented to person, place, and time. She appears well-developed and well-nourished.  HENT:  Head: Atraumatic.  Eyes: EOM are normal.  Neck: Neck supple.  Cardiovascular: Regular rhythm.   Respiratory: Effort normal.  GI: Soft.  Musculoskeletal: She exhibits no edema.   Neurological: She is alert and oriented to person, place, and time.  Skin: Skin is warm and dry.  Psychiatric: She has a normal mood and affect.  vulva nl Vagina; atrophic changes Cervix parous Uterus sl enlarged Adnexa nl  No results found for this or any previous visit (from the past 24 hour(s)).  No results found.  Assessment/Plan: Endometrial thickening on sonogram Endometrial mass P) dx hysteroscopy, D&C removal of mass Risk of surgery including infection, bleeding, injury to surrounding organ structures, internal scar tissue, thermal injury. ALL question answered  Lin Glazier A 07/15/2014, 4:41 AM

## 2014-07-15 NOTE — Anesthesia Preprocedure Evaluation (Addendum)
Anesthesia Evaluation  Patient identified by MRN, date of birth, ID band Patient awake    Reviewed: Allergy & Precautions, H&P , Patient's Chart, lab work & pertinent test results, reviewed documented beta blocker date and time   History of Anesthesia Complications Negative for: history of anesthetic complications  Airway Mallampati: II  TM Distance: >3 FB Neck ROM: full    Dental   Pulmonary  breath sounds clear to auscultation        Cardiovascular Exercise Tolerance: Good hypertension, Rhythm:regular Rate:Normal     Neuro/Psych negative psych ROS   GI/Hepatic   Endo/Other  Hypothyroidism   Renal/GU      Musculoskeletal  (+) Arthritis -,   Abdominal   Peds  Hematology  (+) anemia ,   Anesthesia Other Findings   Reproductive/Obstetrics                            Anesthesia Physical Anesthesia Plan  ASA: II  Anesthesia Plan: General LMA   Post-op Pain Management:    Induction:   Airway Management Planned:   Additional Equipment:   Intra-op Plan:   Post-operative Plan:   Informed Consent: I have reviewed the patients History and Physical, chart, labs and discussed the procedure including the risks, benefits and alternatives for the proposed anesthesia with the patient or authorized representative who has indicated his/her understanding and acceptance.   Dental Advisory Given  Plan Discussed with: CRNA, Surgeon and Anesthesiologist  Anesthesia Plan Comments:         Anesthesia Quick Evaluation

## 2014-07-15 NOTE — Anesthesia Postprocedure Evaluation (Signed)
  Anesthesia Post Note  Patient: Whitney Daniel  Procedure(s) Performed: Procedure(s) (LRB): DILATATION & CURETTAGE/HYSTEROSCOPY WITH RESECTOCOPE (N/A)  Anesthesia type: GA  Patient location: PACU  Post pain: Pain level controlled  Post assessment: Post-op Vital signs reviewed  Last Vitals:  Filed Vitals:   07/15/14 1145  BP: 117/69  Pulse: 52  Temp:   Resp: 21    Post vital signs: Reviewed  Level of consciousness: sedated  Complications: No apparent anesthesia complications

## 2014-07-16 ENCOUNTER — Encounter (HOSPITAL_COMMUNITY): Payer: Self-pay | Admitting: Obstetrics and Gynecology

## 2014-07-23 ENCOUNTER — Telehealth: Payer: Self-pay | Admitting: Neurology

## 2014-07-23 NOTE — Telephone Encounter (Signed)
You had a spot come open on 07-27-14 so i called and got her moved in to that slot on 07-27-14 to see you at 8:30

## 2014-07-27 ENCOUNTER — Ambulatory Visit: Payer: BC Managed Care – PPO | Admitting: Neurology

## 2014-07-28 ENCOUNTER — Telehealth: Payer: Self-pay | Admitting: Neurology

## 2014-07-28 NOTE — Telephone Encounter (Signed)
Pt no showed NP appt w/ our office. Please note, pt verbally confirmed appt when we did reminder calls.   Danae Chen - please send no show letter to pt and notifiy Dr. Jason Nest office, please document who you spoke with./ Sherri S.

## 2014-08-03 ENCOUNTER — Encounter: Payer: Self-pay | Admitting: *Deleted

## 2014-08-03 NOTE — Progress Notes (Signed)
No show letter sent for 08/01/2014. Dr. Beverly Gust office notifiedTammi Klippel)

## 2014-08-07 ENCOUNTER — Other Ambulatory Visit: Payer: Self-pay | Admitting: Nurse Practitioner

## 2014-08-07 DIAGNOSIS — R772 Abnormality of alphafetoprotein: Secondary | ICD-10-CM

## 2014-08-11 ENCOUNTER — Ambulatory Visit: Payer: BC Managed Care – PPO | Admitting: Neurology

## 2014-08-12 ENCOUNTER — Other Ambulatory Visit: Payer: Self-pay | Admitting: Nurse Practitioner

## 2014-08-12 ENCOUNTER — Ambulatory Visit
Admission: RE | Admit: 2014-08-12 | Discharge: 2014-08-12 | Disposition: A | Discharge status: Home / Self Care | Payer: BC Managed Care – PPO | Source: Ambulatory Visit | Attending: Nurse Practitioner | Admitting: Nurse Practitioner

## 2014-08-12 ENCOUNTER — Encounter (INDEPENDENT_AMBULATORY_CARE_PROVIDER_SITE_OTHER): Payer: Self-pay

## 2014-08-12 DIAGNOSIS — R772 Abnormality of alphafetoprotein: Secondary | ICD-10-CM

## 2014-08-25 ENCOUNTER — Telehealth: Payer: Self-pay | Admitting: Neurology

## 2014-08-25 ENCOUNTER — Encounter: Payer: Self-pay | Admitting: Neurology

## 2014-08-25 ENCOUNTER — Ambulatory Visit (INDEPENDENT_AMBULATORY_CARE_PROVIDER_SITE_OTHER): Payer: BC Managed Care – PPO | Admitting: Neurology

## 2014-08-25 VITALS — BP 130/80 | HR 73 | Ht 68.0 in | Wt 200.1 lb

## 2014-08-25 DIAGNOSIS — R251 Tremor, unspecified: Secondary | ICD-10-CM

## 2014-08-25 DIAGNOSIS — R4789 Other speech disturbances: Secondary | ICD-10-CM

## 2014-08-25 DIAGNOSIS — R51 Headache: Secondary | ICD-10-CM

## 2014-08-25 DIAGNOSIS — R29818 Other symptoms and signs involving the nervous system: Secondary | ICD-10-CM

## 2014-08-25 DIAGNOSIS — R519 Headache, unspecified: Secondary | ICD-10-CM

## 2014-08-25 NOTE — Patient Instructions (Addendum)
1.  MRI brain 2.  MRA vessel imaging of the brain and carotids 3.  Routine EEG 4.  Consider blood work and/or EMG, if above testing return normal 5.  Return to clinic in 73-months

## 2014-08-25 NOTE — Progress Notes (Signed)
Note faxed.

## 2014-08-25 NOTE — Telephone Encounter (Signed)
Pt resch new patient appt to 08-25-14

## 2014-08-25 NOTE — Progress Notes (Signed)
[facility] Clinic Note - Initial Visit   Date: 08/25/2014  Whitney Daniel MRN: [id number] DOB: 1956/06/24   Dear Dr. Inda Merlin:   Thank you for your kind referral of Whitney Daniel for consultation of left sided paresthesias. Although her history is well known to you, please allow [place] to reiterate it for the purpose of our medical record. The patient was accompanied to the clinic by self.    History of Present Illness: Whitney Daniel is a 58 y.o. 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  Starting in early 2015, she developed numbness/tingling of the left scalp, arm and leg without any noticeable weakness.  She is not dropping things or fallen.  She denies any neck or back pain.  Symptoms are daily.  No identifiable triggers or alleviating factors.  She does endorse that stress makes the sensation worse.  She reports having intermittent headache on the left side, but it is no severe to take medication.    She has chronic right leg pain due to fibromyxoid sarcoma s/p limb-sparing surgery ([facility], 2011) which involved a large area of her posterior thigh which was invasive locally to the neurovascular structures.   She takes gabapentin 600mg  TID for paresthesias, but limiting weight bearing helps the most.  She also complains of abdominal trembling sensation lasting 2-minutes.  She says that she cannot talking during these spells.  It has only occurred three times, last occuring in the summer.  No history of vision loss.  Out-side paper records, electronic medical record, and images have been reviewed where available and summarized as:  Labs 105/2015: TSH 0.31*, HbA1c 6.3  Past Medical History  Diagnosis Date  . Hypertension   . Cancer   . SVD (spontaneous vaginal delivery)     x 5  .  Hypothyroidism   . Osteoarthritis   . Anemia     Past Surgical History  Procedure Laterality Date  . Wisdom tooth extraction    . Dilation and curettage of uterus      polyps  . Colonoscopy    . Right thigh surgery      cancer  . Dilatation & currettage/hysteroscopy with resectocope N/A 07/15/2014    Procedure: [place];  Surgeon: Marvene Staff, MD;  Location: [facility];  Service: Gynecology;  Laterality: N/A;     Medications:  Current Outpatient Prescriptions on File Prior to Visit  Medication Sig Dispense Refill  . albuterol (PROVENTIL HFA;VENTOLIN HFA) 108 (90 BASE) MCG/ACT inhaler Inhale 2 puffs into the lungs every 6 (six) hours as needed for wheezing.    Marland Kitchen BIOTIN PO Take 1 tablet by mouth daily. 2000 mcg    . gabapentin (NEURONTIN) 600 MG tablet Take 600 mg by mouth 3 (three) times daily as needed. For nerve pain.    . hydrochlorothiazide (HYDRODIURIL) 25 MG tablet Take 25 mg by mouth daily.    Marland Kitchen ibuprofen (ADVIL,MOTRIN) 800 MG tablet Take 1 tablet (800 mg total) by mouth every 8 (eight) hours as needed. 30 tablet 0  . levothyroxine (SYNTHROID, LEVOTHROID) 88 MCG tablet Take 88 mcg by mouth daily before breakfast.    . Multiple Vitamin (MULTIVITAMIN WITH MINERALS) TABS Take 1 tablet by mouth daily.    . vitamin B-12 (CYANOCOBALAMIN) 1000 MCG tablet Take 1,000 mcg by mouth daily.    . vitamin E 400 UNIT capsule  Take 400 Units by mouth daily.     No current facility-administered medications on file prior to visit.    Allergies:  Allergies  Allergen Reactions  . Codeine Other (See Comments)    Patient states "she did not like the way codeine made her feel"    . Vicodin [Hydrocodone-Acetaminophen] Nausea And Vomiting    Doesn't like how it makes her feel but can take if needs to for pain    Family History: Family History  Problem Relation Age of Onset  . Hypertension Mother   . CVA Mother   . Cancer Father   . Diabetes  Sister   . Diabetes Father   . Heart attack Sister     Social History: History   Social History  . Marital Status: Legally Separated    Spouse Name: N/A    Number of Children: N/A  . Years of Education: N/A   Occupational History  . Not on file.   Social History Main Topics  . Smoking status: Never Smoker   . Smokeless tobacco: Never Used  . Alcohol Use: 0.0 oz/week    0 Not specified per week     Comment: social - wine  . Drug Use: No  . Sexual Activity: Yes    Birth Control/ Protection: Post-menopausal   Other Topics Concern  . Not on file   Social History Narrative   Lives alone in a one story home.  Has 5 children.  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     Review of Systems:  CONSTITUTIONAL: No fevers, chills, night sweats, or weight loss.   EYES: No visual changes or eye pain ENT: No hearing changes.  No history of nose bleeds.   RESPIRATORY: No cough, wheezing and shortness of breath.   CARDIOVASCULAR: Negative for chest pain, and palpitations.   GI: Negative for abdominal discomfort, blood in stools or black stools.  No recent change in bowel habits.   GU:  No history of incontinence.   MUSCLOSKELETAL: No history of joint pain or swelling.  No myalgias.   SKIN: Negative for lesions, rash, and itching.   HEMATOLOGY/ONCOLOGY: Negative for prolonged bleeding, bruising easily, and swollen nodes.  +history of cancer.   ENDOCRINE: Negative for cold or heat intolerance, polydipsia or goiter.   PSYCH:  No depression or anxiety symptoms.   NEURO: As Above.   Vital Signs:  BP 130/80 mmHg  Pulse 73  Ht 5\' 8"  (1.727 m)  Wt 200 lb 1 oz (90.748 kg)  BMI 30.43 kg/m2  SpO2 98%   General Medical Exam:   General:  Well appearing, comfortable.   Eyes/ENT: see cranial nerve examination.   Neck: No masses appreciated.  Full range of motion without tenderness.  No carotid  bruits. Respiratory:  Clear to auscultation, good air entry bilaterally.   Cardiac:  Regular rate and rhythm, no murmur.   Extremities:  No deformities, edema, or skin discoloration.  Skin:  No rashes or lesions.  Neurological Exam: MENTAL STATUS including orientation to time, place, person, recent and remote memory, attention span and concentration, language, and fund of knowledge is normal.  Speech is not dysarthric.  CRANIAL NERVES: II:  No visual field defects.  Unremarkable fundi.   III-IV-VI: Pupils equal round and reactive to light.  Normal conjugate, extra-ocular eye movements in all directions of gaze.  No nystagmus.  No ptosis.   V:  Normal facial sensation.  Jaw jerk is absent.   VII:  Normal facial symmetry and movements.  No pathologic facial reflexes.  VIII:  Normal hearing and vestibular function.   IX-X:  Normal palatal movement.   XI:  Normal shoulder shrug and head rotation.   XII:  Normal tongue strength and range of motion, no deviation or fasciculation.  MOTOR:  No atrophy, fasciculations or abnormal movements.  No pronator drift.  Tone is normal.    Right Upper Extremity:    Left Upper Extremity:    Deltoid  5/5   Deltoid  5/5   Biceps  5/5   Biceps  5/5   Triceps  5/5   Triceps  5/5   Wrist extensors  5/5   Wrist extensors  5/5   Wrist flexors  5/5   Wrist flexors  5/5   Finger extensors  5/5   Finger extensors  5/5   Finger flexors  5/5   Finger flexors  5/5   Dorsal interossei  5/5   Dorsal interossei  5/5   Abductor pollicis  5/5   Abductor pollicis  5/5   Tone (Ashworth scale)  0  Tone (Ashworth scale)  0   Right Lower Extremity:    Left Lower Extremity:    Hip flexors  5/5   Hip flexors  5/5   Hip extensors  5/5   Hip extensors  5/5   Knee flexors  5/5   Knee flexors  5/5   Knee extensors  5/5   Knee extensors  5/5   Dorsiflexors  5/5   Dorsiflexors  5/5   Plantarflexors  5/5   Plantarflexors  5/5   Toe extensors  5/5   Toe extensors  5/5   Toe  flexors  5/5   Toe flexors  5/5   Tone (Ashworth scale)  0  Tone (Ashworth scale)  0   MSRs:  Right                                                                 Left brachioradialis 2+  brachioradialis 2+  biceps 2+  biceps 2+  triceps 2+  triceps 2+  patellar 2+  patellar 2+  ankle jerk 2+  ankle jerk 2+  Hoffman no  Hoffman no  plantar response down  plantar response down   SENSORY:  Normal and symmetric perception of light touch, pinprick, vibration, and proprioception.  Romberg's sign absent.   COORDINATION/GAIT: Normal finger-to- nose-finger and heel-to-shin.  Intact rapid alternating movements bilaterally.  Able to rise from a chair without using arms.  Gait narrow based and stable. Tandem and stressed gait intact.    IMPRESSION/PLAN: Mrs. Gathright is a 58 year-old female with right thigh fibromyxoid sarcoma s/p resection (4782) complicated by chronic sciatica presenting for evaluation of left hemisensory changes.  Her exam is entirely normal and non-focal. Based on her lateralizing complaints by history, need to evaluate for structural disease of the brain and vessels. Further, her spells of abdominal trembling and speech arrest could are atypical but since these are stereotyped, will obtain routine EEG.  Pending the above results, additional laboratory testing for neuropathy and/EMG of the left side can be done.  Finally, because there is a component of headaches with associated  sensory changes, migraine equivalent syndrome needs to be considered, especially if work-up returns normal.  Return to clinic in 29-months    The duration of this appointment visit was 45 minutes of face-to-face time with the patient.  Greater than 50% of this time was spent in counseling, explanation of diagnosis, planning of further management, and coordination of care.   Thank you for allowing me to participate in patient's care.  If I can answer any additional questions, I would be pleased to do so.      Sincerely,    Chrishun Scheer K. Posey Pronto, DO

## 2014-08-31 ENCOUNTER — Other Ambulatory Visit: Payer: BC Managed Care – PPO

## 2014-08-31 ENCOUNTER — Telehealth: Payer: Self-pay | Admitting: Neurology

## 2014-08-31 NOTE — Telephone Encounter (Signed)
Pt no showed today's EEG appt.  Erica - please send no show to pt / Sherri S.

## 2014-09-01 ENCOUNTER — Encounter: Payer: Self-pay | Admitting: *Deleted

## 2014-09-01 NOTE — Progress Notes (Signed)
No show letter sent 08/31/2014

## 2014-09-14 ENCOUNTER — Ambulatory Visit (HOSPITAL_COMMUNITY)
Admission: RE | Admit: 2014-09-14 | Discharge: 2014-09-14 | Disposition: A | Discharge status: Home / Self Care | Payer: BLUE CROSS/BLUE SHIELD | Source: Ambulatory Visit | Attending: Neurology | Admitting: Neurology

## 2014-09-14 ENCOUNTER — Other Ambulatory Visit (HOSPITAL_COMMUNITY): Payer: BC Managed Care – PPO

## 2014-09-14 DIAGNOSIS — R531 Weakness: Secondary | ICD-10-CM | POA: Insufficient documentation

## 2014-09-14 DIAGNOSIS — R202 Paresthesia of skin: Secondary | ICD-10-CM | POA: Insufficient documentation

## 2014-09-14 DIAGNOSIS — R410 Disorientation, unspecified: Secondary | ICD-10-CM | POA: Diagnosis not present

## 2014-09-14 LAB — POCT I-STAT CREATININE: Creatinine, Ser: 1.1 mg/dL (ref 0.50–1.10)

## 2014-09-14 MED ORDER — GADOBENATE DIMEGLUMINE 529 MG/ML IV SOLN
18.0000 mL | Freq: Once | INTRAVENOUS | Status: AC | PRN
  Administered 2014-09-14: 18 mL via INTRAVENOUS

## 2014-09-23 ENCOUNTER — Ambulatory Visit: Payer: BC Managed Care – PPO | Admitting: Neurology

## 2014-09-28 ENCOUNTER — Ambulatory Visit: Payer: BC Managed Care – PPO | Admitting: Neurology

## 2014-10-27 ENCOUNTER — Encounter: Payer: Self-pay | Admitting: Neurology

## 2014-10-27 ENCOUNTER — Ambulatory Visit (INDEPENDENT_AMBULATORY_CARE_PROVIDER_SITE_OTHER): Payer: BLUE CROSS/BLUE SHIELD | Admitting: Neurology

## 2014-10-27 VITALS — BP 128/84 | HR 64 | Ht 68.0 in | Wt 205.0 lb

## 2014-10-27 DIAGNOSIS — R251 Tremor, unspecified: Secondary | ICD-10-CM

## 2014-10-27 DIAGNOSIS — R29818 Other symptoms and signs involving the nervous system: Secondary | ICD-10-CM

## 2014-10-27 DIAGNOSIS — S7401XS Injury of sciatic nerve at hip and thigh level, right leg, sequela: Secondary | ICD-10-CM

## 2014-10-27 DIAGNOSIS — R202 Paresthesia of skin: Secondary | ICD-10-CM | POA: Insufficient documentation

## 2014-10-27 MED ORDER — NORTRIPTYLINE HCL 10 MG PO CAPS
10.0000 mg | ORAL_CAPSULE | Freq: Every day | ORAL | Status: DC
Start: 2014-10-27 — End: 2015-01-06

## 2014-10-27 NOTE — Progress Notes (Signed)
Follow-up Visit   Date: 10/27/2014   Whitney Daniel MRN: [id number] DOB: 21-Aug-1956   Interim History: Whitney Daniel is a 59 y.o. 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  The patient was accompanied to the clinic by self.   History of present illness: Starting in early 2015, she developed numbness/tingling of the left scalp, arm and leg without any noticeable weakness. She is not dropping things or fallen. She denies any neck or back pain. Symptoms are daily. No identifiable triggers or alleviating factors. She does endorse that stress makes the sensation worse. She reports having intermittent headache on the left side, but it is no severe to take medication.   She has chronic right leg pain due to fibromyxoid sarcoma s/p limb-sparing surgery ([facility], 2011) which involved a large area of her posterior thigh which was invasive locally to the neurovascular structures. She takes gabapentin 600mg  TID for paresthesias, but limiting weight bearing helps the most.  She also complains of abdominal trembling sensation lasting 2-minutes. She says that she cannot talking during these spells. It has only occurred three times, last occuring in the summer. No history of vision loss.  UPDATE 10/27/2014:  At her last visit, I ordered routine EEG, but patient no-showed to her appointment. MRI/A brain and neck returned normal.   She was recently diagnosed with rheumatoid arthritis and is scheduled to see a rheumatologist for further management.   She continues to feel weak all over the left arm and leg.   She has no had any further spells of trembling or speech changes.  She complains of pressure over her leg especially when sitting, and was taking gabapentin 300mg  in the morning which helped her pain, but she  felt very groggy and "high" so stopped it.  She has no tried any other medication.   Medications:  Current Outpatient Prescriptions on File Prior to Visit  Medication Sig Dispense Refill  . albuterol (PROVENTIL HFA;VENTOLIN HFA) 108 (90 BASE) MCG/ACT inhaler Inhale 2 puffs into the lungs every 6 (six) hours as needed for wheezing.    Marland Kitchen BIOTIN PO Take 1 tablet by mouth daily. 2000 mcg    . hydrochlorothiazide (HYDRODIURIL) 25 MG tablet Take 25 mg by mouth daily.    Marland Kitchen ibuprofen (ADVIL,MOTRIN) 800 MG tablet Take 1 tablet (800 mg total) by mouth every 8 (eight) hours as needed. 30 tablet 0  . levothyroxine (SYNTHROID, LEVOTHROID) 88 MCG tablet Take 88 mcg by mouth daily before breakfast.    . Multiple Vitamin (MULTIVITAMIN WITH MINERALS) TABS Take 1 tablet by mouth daily.    . vitamin B-12 (CYANOCOBALAMIN) 1000 MCG tablet Take 1,000 mcg by mouth daily.    . vitamin E 400 UNIT capsule Take 400 Units by mouth daily.     No current facility-administered medications on file prior to visit.    Allergies:  Allergies  Allergen Reactions  . Codeine Other (See Comments)    Patient states "she did not like the way codeine made her feel"    . Vicodin [Hydrocodone-Acetaminophen] Nausea And Vomiting    Doesn't like how it makes her feel but can take if needs to for pain    Review of Systems:  CONSTITUTIONAL: No fevers, chills, night sweats, or weight loss.  EYES: No visual changes or eye pain ENT: No hearing changes.  No history of nose bleeds.  RESPIRATORY: No cough, wheezing and shortness of breath.   CARDIOVASCULAR: Negative for chest pain, and palpitations.   GI: Negative for abdominal discomfort, blood in stools or black stools.  No recent change in bowel habits.   GU:  No history of incontinence.   MUSCLOSKELETAL: +history of joint pain or swelling.  +myalgias.   SKIN: Negative for lesions, rash, and itching.   ENDOCRINE: Negative for cold or heat intolerance, polydipsia or goiter.     PSYCH:  No depression or anxiety symptoms.   NEURO: As Above.   Vital Signs:  BP 128/84 mmHg  Pulse 64  Ht 5\' 8"  (1.727 m)  Wt 205 lb (92.987 kg)  BMI 31.18 kg/m2  SpO2 99%  Neurological Exam: MENTAL STATUS including orientation to time, place, person, recent and remote memory, attention span and concentration, language, and fund of knowledge is normal.  Speech is not dysarthric.  CRANIAL NERVES: No visual field defects.  Pupils equal round and reactive to light.  Normal conjugate, extra-ocular eye movements in all directions of gaze.  No ptosis. Normal facial sensation.  Face is symmetric. Palate elevates symmetrically.  Tongue is midline.  MOTOR:  Motor strength is 5/5 in all extremities.  No atrophy, fasciculations or abnormal movements.  No pronator drift.  Tone is normal.    MSRs:  Reflexes are 2+/4 throughout.  SENSORY:  Intact to throughout to vibration.  COORDINATION/GAIT:  Gait narrow based and stable.   Data: Labs 06/08/2014: TSH 0.31*, HbA1c 6.3  MRI/A brain and MRA neck 09/14/2014:  No acute intracranial findings. Minor white matter signal abnormality, likely small vessel disease. No proximal flow reducing lesion of the intracranial or extracranial circulation.  IMPRESSION/PLAN: Whitney Daniel is a 59 year-old female with right thigh fibromyxoid sarcoma s/p resection ([id number]) complicated by chronic sciatica returning for evaluation of left hemisensory changes. She also recently was diagnosed with rheumatoid arthritis and has not started medications.    Since symptoms are ongoing, will order EMG of the left arm and leg.  Neuropathy associated with RA is unlikely as her exam is entirely normal and non-focal.  MRI/A brain and neck did not show any abnormalities.  Further, her spells of abdominal trembling and speech arrest could are atypical and less likely to be anything worrisome, but patient is requesting routine EEG.  She is also complaining of left buttocks pain from  sciatica.  She does not like the way gabapentin made her feel, so stopped this.  I will start her on nortriptyline 10mg  and uptitrate as tolerable.   Because there is a component of headaches with associated sensory changes, migraine equivalent syndrome needs to be considered, especially if work-up returns normal.    The duration of this appointment visit was 25 minutes of face-to-face time with the patient.  Greater than 50% of this time was spent in counseling, explanation of diagnosis, planning of further management, and coordination of care.   Thank you for allowing me to participate in patient's care.  If I can answer any additional questions, I would be pleased to do so.    Sincerely,    Donika K. Posey Pronto, DO

## 2014-10-27 NOTE — Progress Notes (Signed)
Note faxed.

## 2014-10-27 NOTE — Patient Instructions (Addendum)
1.  Routine EEG 2.  EMG of the left arm and leg 3.  Start nortriptyline 10mg  at bedtime 4.  Telephone update with results

## 2014-10-29 ENCOUNTER — Ambulatory Visit (INDEPENDENT_AMBULATORY_CARE_PROVIDER_SITE_OTHER): Payer: BLUE CROSS/BLUE SHIELD | Admitting: Neurology

## 2014-10-29 DIAGNOSIS — R251 Tremor, unspecified: Secondary | ICD-10-CM

## 2014-10-29 DIAGNOSIS — S7401XS Injury of sciatic nerve at hip and thigh level, right leg, sequela: Secondary | ICD-10-CM

## 2014-10-29 DIAGNOSIS — R29818 Other symptoms and signs involving the nervous system: Secondary | ICD-10-CM

## 2014-10-29 NOTE — Procedures (Signed)
[facility]  [place], [address]  [place], [place] [address] Tel: [phone] Fax:  [phone] Test Date:  10/29/2014  Patient: Whitney Daniel DOB: 01-25-1956 Physician: Narda Amber, DO  Sex: Female Height: 5\' 8"  Ref Phys: Narda Amber  ID#: [id number] Temp: 37.0C Technician: Laureen Ochs R. NCS T.   Patient Complaints: Patient is a 59 year old female here for evaluation of left sided paresthesias in her limbs.  NCV & EMG Findings: Extensive electrodiagnostic testing of the left upper and lower extremity shows:  1. All sensory responses including the median, ulnar, radial, and mixed palmar, sural, and superficial peroneal nerves are within normal limits.  2. All motor responses including the median, ulnar, peroneal, and tibial nerves are within normal limits.  3. In the arms, chronic motor axon loss changes are seen isolated to the deltoid and biceps muscles, without accompanied active denervation.  4. No active or chronic motor axon loss changes are seen in the left lower extremity.  Impression: 1. Chronic C5 radiculopathy affecting the left upper extremity, mild in degree electrically. 2. There is no evidence of a generalized sensorimotor polyneuropathy, lumbosacral radiculopathy, or carpal tunnel syndrome affecting the left side.   ___________________________ Narda Amber, DO    Nerve Conduction Studies Anti Sensory Summary Table   Site NR Peak (ms) Norm Peak (ms) P-T Amp (V) Norm P-T Amp  Left Median Anti Sensory (2nd Digit)  34C  Wrist    3.2 <3.6 35.4 >15  Left Radial Anti Sensory (Base 1st Digit)  32C  Wrist    2.4 <2.7 30.8 >14  Left Sup Peroneal Anti Sensory (Ant Lat Mall)  34C  12 cm    2.7 <4.6 8.3 >4  Left Sural Anti Sensory (Lat Mall)  34C  Calf    3.1 <4.6 12.0 >4  Left Ulnar Anti Sensory (5th Digit)  32C  Wrist    3.0 <3.1 18.8 >10   Motor Summary Table   Site NR Onset (ms) Norm Onset (ms) O-P Amp (mV) Norm O-P Amp Site1 Site2  Delta-0 (ms) Dist (cm) Vel (m/s) Norm Vel (m/s)  Left Median Motor (Abd Poll Brev)  32C  Wrist    3.4 <4.0 10.1 >6 Elbow Wrist 6.0 30.0 50 >50  Elbow    9.4  9.7         Left Peroneal Motor (Ext Dig Brev)  34C  Ankle    3.6 <6.0 5.0 >2.5 B Fib Ankle 8.7 48.0 55 >40  B Fib    12.3  4.7  Poplt B Fib 1.9 10.0 53 >40  Poplt    14.2  4.3         Left Tibial Motor (Abd Hall Brev)  32C    Paient did not tolerate proximal stimulation  Ankle    4.5 <6.0 11.3 >4 Knee Ankle 9.5 42.0 44 >40  Knee    14.0  5.0         Left Ulnar Motor (Abd Dig Minimi)  32C  Wrist    2.6 <3.1 8.5 >7 B Elbow Wrist 4.9 25.0 51 >50  B Elbow    7.5  7.7  A Elbow B Elbow 2.0 10.0 50 >50  A Elbow    9.5  7.5          Comparison Summary Table   Site NR Peak (ms) Norm Peak (ms) P-T Amp (V) Site1 Site2 Delta-P (ms) Norm Delta (ms)  Left Median/Ulnar Palm Comparison (Wrist - 8cm)  32C  Median Palm    2.1 <2.2 35.6 Median Palm Ulnar Palm 0.0   Ulnar Palm    2.1 <2.2 11.2       EMG   Side Muscle Ins Act Fibs Psw Fasc Number Recrt Dur Dur. Amp Amp. Poly Poly. Comment  Left AntTibialis Nml Nml Nml Nml Nml Nml Nml Nml Nml Nml Nml Nml N/A  Left Gastroc Nml Nml Nml Nml Nml Nml Nml Nml Nml Nml Nml Nml N/A  Left Flex Dig Long Nml Nml Nml Nml Nml Nml Nml Nml Nml Nml Nml Nml N/A  Left RectFemoris Nml Nml Nml Nml Nml Nml Nml Nml Nml Nml Nml Nml N/A  Left GluteusMed Nml Nml Nml Nml Nml Nml Nml Nml Nml Nml Nml Nml N/A  Left 1stDorInt Nml Nml Nml Nml Nml Nml Nml Nml Nml Nml Nml Nml N/A  Left Ext Indicis Nml Nml Nml Nml Nml Nml Nml Nml Nml Nml Nml Nml N/A  Left PronatorTeres Nml Nml Nml Nml Nml Nml Nml Nml Nml Nml Nml Nml N/A  Left Biceps Nml Nml Nml Nml 1- Mod-R Some 1+ Some 1+ Nml Nml N/A  Left Triceps Nml Nml Nml Nml Nml Nml Nml Nml Nml Nml Nml Nml N/A  Left Deltoid Nml Nml Nml Nml 1- Mod-R Some 1+ Some 1+ Nml Nml N/A      Waveforms:

## 2014-10-30 ENCOUNTER — Other Ambulatory Visit: Payer: Self-pay | Admitting: *Deleted

## 2014-10-30 DIAGNOSIS — R202 Paresthesia of skin: Secondary | ICD-10-CM

## 2014-11-05 NOTE — Procedures (Signed)
ELECTROENCEPHALOGRAM REPORT  Date of Study: 10/29/2014  Patient's Name: Whitney Daniel MRN: [id number] Date of Birth: [birth year]  Referring Provider: Dr. Narda Amber  Clinical History: This is a 59 year old woman with episodes of abdominal trembling with speech arrest.  Medications: HCTZ, Synthroid, vitamin E  Technical Summary: A multichannel digital EEG recording measured by the international 10-20 system with electrodes applied with paste and impedances below 5000 ohms performed in our laboratory with EKG monitoring in an awake and asleep patient.  Hyperventilation and photic stimulation were performed.  The digital EEG was referentially recorded, reformatted, and digitally filtered in a variety of bipolar and referential montages for optimal display.    Description: The patient is awake and asleep during the recording.  During maximal wakefulness, there is a symmetric, medium voltage 9.5-10 Hz posterior dominant rhythm that attenuates with eye opening.  The record is symmetric.  During drowsiness and sleep, there is an increase in theta slowing of the background, with shifting asymmetry over the bilateral temporal regions, at times sharply contoured without clear epileptogenic potential.  Vertex waves and symmetric sleep spindles were seen.  Hyperventilation and photic stimulation did not elicit any abnormalities.  There were no epileptiform discharges or electrographic seizures seen.    EKG lead was unremarkable.  Impression: This awake and asleep EEG is within normal limits.  Clinical Correlation: A normal EEG does not exclude a clinical diagnosis of epilepsy. Clinical correlation is advised.   Ellouise Newer, M.D.

## 2014-11-06 ENCOUNTER — Other Ambulatory Visit: Payer: Self-pay | Admitting: Orthopedic Surgery

## 2014-11-18 ENCOUNTER — Other Ambulatory Visit: Payer: Self-pay | Admitting: Internal Medicine

## 2014-11-18 DIAGNOSIS — M79662 Pain in left lower leg: Secondary | ICD-10-CM

## 2014-11-20 NOTE — Progress Notes (Signed)
Addendum  These changes are an addendum to my office visit dated 10/27/2014 to reflect correct documentation, as per patient's request.  Patient did not deny leg or back pain.  She does not take pain medication as it makes her sick. She had surgery for fibromyxoid sarcoma at [facility] in 2011, not [place].  Gabapentin was taken initially, but she does not take it anymore.    Donika K. Posey Pronto, DO 11/20/2014

## 2014-11-20 NOTE — Progress Notes (Signed)
Patient notified

## 2014-11-25 ENCOUNTER — Inpatient Hospital Stay
Admission: RE | Admit: 2014-11-25 | Discharge: 2014-11-25 | Disposition: A | Discharge status: Home / Self Care | Payer: BLUE CROSS/BLUE SHIELD | Source: Ambulatory Visit | Attending: Internal Medicine | Admitting: Internal Medicine

## 2014-11-30 ENCOUNTER — Ambulatory Visit
Admission: RE | Admit: 2014-11-30 | Discharge: 2014-11-30 | Disposition: A | Discharge status: Home / Self Care | Payer: BLUE CROSS/BLUE SHIELD | Source: Ambulatory Visit | Attending: Internal Medicine | Admitting: Internal Medicine

## 2014-11-30 DIAGNOSIS — M79662 Pain in left lower leg: Secondary | ICD-10-CM

## 2014-11-30 MED ORDER — GADOBENATE DIMEGLUMINE 529 MG/ML IV SOLN
10.0000 mL | Freq: Once | INTRAVENOUS | Status: AC | PRN
  Administered 2014-11-30: 10 mL via INTRAVENOUS

## 2014-12-03 ENCOUNTER — Encounter: Payer: BLUE CROSS/BLUE SHIELD | Admitting: Neurology

## 2015-01-06 ENCOUNTER — Encounter: Payer: Self-pay | Admitting: Neurology

## 2015-01-06 ENCOUNTER — Ambulatory Visit (INDEPENDENT_AMBULATORY_CARE_PROVIDER_SITE_OTHER): Payer: BLUE CROSS/BLUE SHIELD | Admitting: Neurology

## 2015-01-06 VITALS — BP 124/84 | HR 69 | Ht 68.0 in | Wt 205.0 lb

## 2015-01-06 DIAGNOSIS — S7401XS Injury of sciatic nerve at hip and thigh level, right leg, sequela: Secondary | ICD-10-CM

## 2015-01-06 DIAGNOSIS — Z859 Personal history of malignant neoplasm, unspecified: Secondary | ICD-10-CM

## 2015-01-06 DIAGNOSIS — M5442 Lumbago with sciatica, left side: Secondary | ICD-10-CM | POA: Diagnosis not present

## 2015-01-06 MED ORDER — DULOXETINE HCL 30 MG PO CPEP: 30.0000 mg | ORAL_CAPSULE | Freq: Every day | ORAL | Status: DC

## 2015-01-06 NOTE — Progress Notes (Signed)
Follow-up Visit   Date: 01/06/2015   Whitney Daniel MRN: [id number] DOB: 08/03/1956   Interim History: Whitney Daniel is a 59 y.o. 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  The patient was accompanied to the clinic by self.   History of present illness: Starting in early 2015, she developed numbness/tingling of the left scalp, arm and leg without any noticeable weakness. She is not dropping things or fallen. She denies any neck or back pain. Symptoms are daily. No identifiable triggers or alleviating factors. She does endorse that stress makes the sensation worse. She reports having intermittent headache on the left side, but it is no severe to take medication.   She has chronic right leg pain due to fibromyxoid sarcoma s/p limb-sparing surgery ([facility], 2011) which involved a large area of her posterior thigh which was invasive locally to the neurovascular structures. She takes gabapentin 600mg  TID for paresthesias, but limiting weight bearing helps the most.  She also complains of abdominal trembling sensation lasting 2-minutes. She says that she cannot talking during these spells. It has only occurred three times, last occuring in the summer. No history of vision loss.  UPDATE 10/27/2014:  At her last visit, I ordered routine EEG, but patient no-showed to her appointment. MRI/A brain and neck returned normal.   She was recently diagnosed with rheumatoid arthritis and is scheduled to see a rheumatologist for further management.   She continues to feel weak all over the left arm and leg.   She has no had any further spells of trembling or speech changes.  She complains of pressure over her leg especially when sitting, and was taking gabapentin 300mg  in the  morning which helped her pain, but she felt very groggy and "high" so stopped it.  She has no tried any other medication.  UPDATE 01/06/2015:  She stopped nortriptyline and gabapentin because of side effects.  She is having physical therapy for her neck and left hip.  She is here today because of worsening right leg pain which is related to her prior surgery.  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 Further, she also complains of sharp low back pain radiating into her left leg and foot.  Recent MRI of her posterior thigh performed at [facility] did not show any recurrence of malignancy.   Medications:  Current Outpatient Prescriptions on File Prior to Visit  Medication Sig Dispense Refill  . albuterol (PROVENTIL HFA;VENTOLIN HFA) 108 (90 BASE) MCG/ACT inhaler Inhale 2 puffs into the lungs every 6 (six) hours as needed for wheezing.    . hydrochlorothiazide (HYDRODIURIL) 25 MG tablet Take 25 mg by mouth daily.    Marland Kitchen levothyroxine (SYNTHROID, LEVOTHROID) 88 MCG tablet Take 88 mcg by mouth daily before breakfast.    . Multiple Vitamin (MULTIVITAMIN WITH MINERALS) TABS Take 1 tablet by mouth daily.    . vitamin E 400 UNIT capsule Take by mouth.     No current facility-administered medications on file prior to visit.    Allergies:  Allergies  Allergen Reactions  . Codeine Other (See Comments)    Patient states "she did not like the way codeine  made her feel"    . Vicodin [Hydrocodone-Acetaminophen] Nausea And Vomiting    Doesn't like how it makes her feel but can take if needs to for pain    Review of Systems:  CONSTITUTIONAL: No fevers, chills, night sweats, or weight loss.  EYES: No visual changes or eye pain ENT: No hearing changes.  No history of nose bleeds.   RESPIRATORY: No cough, wheezing and shortness of breath.   CARDIOVASCULAR: Negative  for chest pain, and palpitations.   GI: Negative for abdominal discomfort, blood in stools or black stools.  No recent change in bowel habits.   GU:  No history of incontinence.   MUSCLOSKELETAL: +history of joint pain or swelling.  +myalgias.   SKIN: Negative for lesions, rash, and itching.   ENDOCRINE: Negative for cold or heat intolerance, polydipsia or goiter.   PSYCH:  No depression or anxiety symptoms.   NEURO: As Above.   Vital Signs:  BP 124/84 mmHg  Pulse 69  Ht 5\' 8"  (1.727 m)  Wt 205 lb (92.987 kg)  BMI 31.18 kg/m2  SpO2 98%  Neurological Exam: MENTAL STATUS including orientation to time, place, person, recent and remote memory, attention span and concentration, language, and fund of knowledge is normal.  Speech is not dysarthric.  CRANIAL NERVES:  Pupils equal round and reactive to light.  Normal conjugate, extra-ocular eye movements in all directions of gaze.   Face is symmetric.   MOTOR:  Motor strength is 5/5 in all extremities.  No atrophy, fasciculations or abnormal movements.  No pronator drift.  Tone is normal.    MSRs:  Reflexes are 2+/4 throughout.  SENSORY:  Intact to throughout to vibration and pin prick.  COORDINATION/GAIT:  Gait narrow based and stable.   Data: Labs 06/08/2014: TSH 0.31*, HbA1c 6.3  MRI/A brain and MRA neck 09/14/2014:  No acute intracranial findings. Minor white matter signal abnormality, likely small vessel disease. No proximal flow reducing lesion of the intracranial or extracranial circulation.  EMG of the left side 10/29/2014:  1. Chronic C5 radiculopathy affecting the left upper extremity, mild in degree electrically. 2. There is no evidence of a generalized sensorimotor polyneuropathy, lumbosacral radiculopathy, or carpal tunnel syndrome affecting the left side.   IMPRESSION/PLAN: 1.  Nonspecific left hemisensory changes of the face, arm, and leg  - MRI/A brain is normal  - EMG with only left C5 radiculopathy (mild), no  evidence of neuropathy or lumbosacral radiculopathy on the left  - Uncertain as to underlying etiology given normal work-up  2.  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    -  She stopped nortriptyline and gabapentin due to side effects  -  Will check MRI lumbar spine to look for a superimposed radiculopathy as there is stable changes on her MRI pelvis  -  Start Cymbalta 30mg  daily  - Consider PM&R going forward  3.  Intermittent spells of abdominal trembling and speech arrest  - Negative EEG  - Clinically improved   The duration of this appointment visit was 25 minutes of face-to-face time with the patient.  Greater than 50% of this time was spent in counseling, explanation of diagnosis, planning of further management, and coordination of care.   Thank you for allowing me to participate in patient's care.  If I can answer any additional questions, I would be pleased to do so.  Sincerely,    Luv Mish K. Posey Pronto, DO

## 2015-01-06 NOTE — Progress Notes (Signed)
Note sent

## 2015-01-06 NOTE — Patient Instructions (Signed)
1.  MRI lumbar spine wo contrast 2.  Start Cymbalta 30mg  daily 3.  Consider referral to physiatry going forward for pain management options

## 2015-01-15 ENCOUNTER — Ambulatory Visit
Admission: RE | Admit: 2015-01-15 | Discharge: 2015-01-15 | Disposition: A | Discharge status: Home / Self Care | Payer: BLUE CROSS/BLUE SHIELD | Source: Ambulatory Visit | Attending: Neurology | Admitting: Neurology

## 2015-01-15 DIAGNOSIS — S7401XS Injury of sciatic nerve at hip and thigh level, right leg, sequela: Secondary | ICD-10-CM

## 2015-01-15 DIAGNOSIS — Z859 Personal history of malignant neoplasm, unspecified: Secondary | ICD-10-CM

## 2015-01-15 DIAGNOSIS — M5442 Lumbago with sciatica, left side: Secondary | ICD-10-CM

## 2015-01-19 ENCOUNTER — Other Ambulatory Visit: Payer: Self-pay | Admitting: *Deleted

## 2015-01-19 DIAGNOSIS — R202 Paresthesia of skin: Secondary | ICD-10-CM

## 2015-01-21 ENCOUNTER — Telehealth: Payer: Self-pay | Admitting: Neurology

## 2015-01-21 NOTE — Telephone Encounter (Signed)
Returned patient's call. She wanted to check on the status of her referral to Physical Medicine & Rehabilitation. I did tell her referral was just sent on 5/17 & that office will give her a call about an appt. I advised her is she hasn't heard anything from them within a week to give Caryl Pina a call back so she can follow up on the referral.

## 2015-01-21 NOTE — Telephone Encounter (Signed)
Pt needs to talk to someone about a referral. She states that we were working a referral for her but was not sure where please call her at [phone]

## 2015-01-28 ENCOUNTER — Telehealth: Payer: Self-pay | Admitting: *Deleted

## 2015-01-28 NOTE — Telephone Encounter (Signed)
Patient called back and physical medicine had contacted her this morning.

## 2015-01-28 NOTE — Telephone Encounter (Signed)
Patient has not heard from the place she is suppose to be have PT with she would like to know where she wa referred to and the number so she may call herself and check on this

## 2015-03-05 DIAGNOSIS — M179 Osteoarthritis of knee, unspecified: Secondary | ICD-10-CM | POA: Insufficient documentation

## 2015-03-05 DIAGNOSIS — M1710 Unilateral primary osteoarthritis, unspecified knee: Secondary | ICD-10-CM | POA: Insufficient documentation

## 2015-03-09 ENCOUNTER — Encounter: Payer: Self-pay | Admitting: Physical Medicine & Rehabilitation

## 2015-03-19 ENCOUNTER — Emergency Department (HOSPITAL_BASED_OUTPATIENT_CLINIC_OR_DEPARTMENT_OTHER)
Admission: EM | Admit: 2015-03-19 | Discharge: 2015-03-19 | Disposition: A | Discharge status: Home / Self Care | Payer: 59 | Attending: Emergency Medicine | Admitting: Emergency Medicine

## 2015-03-19 ENCOUNTER — Encounter (HOSPITAL_BASED_OUTPATIENT_CLINIC_OR_DEPARTMENT_OTHER): Payer: Self-pay | Admitting: Emergency Medicine

## 2015-03-19 ENCOUNTER — Emergency Department (HOSPITAL_BASED_OUTPATIENT_CLINIC_OR_DEPARTMENT_OTHER): Payer: 59

## 2015-03-19 DIAGNOSIS — Z8739 Personal history of other diseases of the musculoskeletal system and connective tissue: Secondary | ICD-10-CM | POA: Insufficient documentation

## 2015-03-19 DIAGNOSIS — Z79899 Other long term (current) drug therapy: Secondary | ICD-10-CM | POA: Insufficient documentation

## 2015-03-19 DIAGNOSIS — E039 Hypothyroidism, unspecified: Secondary | ICD-10-CM | POA: Diagnosis not present

## 2015-03-19 DIAGNOSIS — N390 Urinary tract infection, site not specified: Secondary | ICD-10-CM | POA: Insufficient documentation

## 2015-03-19 DIAGNOSIS — N200 Calculus of kidney: Secondary | ICD-10-CM

## 2015-03-19 DIAGNOSIS — N201 Calculus of ureter: Secondary | ICD-10-CM | POA: Insufficient documentation

## 2015-03-19 DIAGNOSIS — I10 Essential (primary) hypertension: Secondary | ICD-10-CM | POA: Insufficient documentation

## 2015-03-19 DIAGNOSIS — Z859 Personal history of malignant neoplasm, unspecified: Secondary | ICD-10-CM | POA: Insufficient documentation

## 2015-03-19 DIAGNOSIS — R1032 Left lower quadrant pain: Secondary | ICD-10-CM

## 2015-03-19 DIAGNOSIS — Z862 Personal history of diseases of the blood and blood-forming organs and certain disorders involving the immune mechanism: Secondary | ICD-10-CM | POA: Diagnosis not present

## 2015-03-19 DIAGNOSIS — R319 Hematuria, unspecified: Secondary | ICD-10-CM | POA: Diagnosis present

## 2015-03-19 LAB — COMPREHENSIVE METABOLIC PANEL
ALBUMIN: 4.3 g/dL (ref 3.5–5.0)
ALK PHOS: 75 U/L (ref 38–126)
ALT: 17 U/L (ref 14–54)
AST: 23 U/L (ref 15–41)
Anion gap: 7 (ref 5–15)
BILIRUBIN TOTAL: 0.6 mg/dL (ref 0.3–1.2)
BUN: 16 mg/dL (ref 6–20)
CALCIUM: 10.2 mg/dL (ref 8.9–10.3)
CO2: 29 mmol/L (ref 22–32)
CREATININE: 1.19 mg/dL — AB (ref 0.44–1.00)
Chloride: 102 mmol/L (ref 101–111)
GFR calc non Af Amer: 50 mL/min — ABNORMAL LOW (ref >60)
GFR, EST AFRICAN AMERICAN: 58 mL/min — AB (ref >60)
Glucose, Bld: 111 mg/dL — ABNORMAL HIGH (ref 65–99)
POTASSIUM: 3.1 mmol/L — AB (ref 3.5–5.1)
SODIUM: 138 mmol/L (ref 135–145)
Total Protein: 8.4 g/dL — ABNORMAL HIGH (ref 6.5–8.1)

## 2015-03-19 LAB — URINALYSIS, ROUTINE W REFLEX MICROSCOPIC
Bilirubin Urine: NEGATIVE
Glucose, UA: NEGATIVE mg/dL
HGB URINE DIPSTICK: NEGATIVE
Ketones, ur: NEGATIVE mg/dL
NITRITE: NEGATIVE
PH: 7 (ref 5.0–8.0)
Protein, ur: NEGATIVE mg/dL
Specific Gravity, Urine: 1.018 (ref 1.005–1.030)
Urobilinogen, UA: 0.2 mg/dL (ref 0.0–1.0)

## 2015-03-19 LAB — CBC WITH DIFFERENTIAL/PLATELET
BASOS PCT: 1 % (ref 0–1)
Basophils Absolute: 0.1 10*3/uL (ref 0.0–0.1)
EOS ABS: 0.1 10*3/uL (ref 0.0–0.7)
Eosinophils Relative: 1 % (ref 0–5)
HCT: 38.5 % (ref 36.0–46.0)
Hemoglobin: 12.5 g/dL (ref 12.0–15.0)
Lymphocytes Relative: 24 % (ref 12–46)
Lymphs Abs: 2.2 10*3/uL (ref 0.7–4.0)
MCH: 27.8 pg (ref 26.0–34.0)
MCHC: 32.5 g/dL (ref 30.0–36.0)
MCV: 85.7 fL (ref 78.0–100.0)
Monocytes Absolute: 0.8 10*3/uL (ref 0.1–1.0)
Monocytes Relative: 9 % (ref 3–12)
Neutro Abs: 5.8 10*3/uL (ref 1.7–7.7)
Neutrophils Relative %: 65 % (ref 43–77)
Platelets: 289 10*3/uL (ref 150–400)
RBC: 4.49 MIL/uL (ref 3.87–5.11)
RDW: 14 % (ref 11.5–15.5)
WBC: 9 10*3/uL (ref 4.0–10.5)

## 2015-03-19 LAB — URINE MICROSCOPIC-ADD ON

## 2015-03-19 MED ORDER — CEFTRIAXONE SODIUM 1 G IJ SOLR
INTRAMUSCULAR | Status: AC
  Filled 2015-03-19: qty 10

## 2015-03-19 MED ORDER — SODIUM CHLORIDE 0.9 % IV BOLUS (SEPSIS)
1000.0000 mL | Freq: Once | INTRAVENOUS | Status: AC
  Administered 2015-03-19: 1000 mL via INTRAVENOUS

## 2015-03-19 MED ORDER — HYDROMORPHONE HCL 1 MG/ML IJ SOLN
1.0000 mg | Freq: Once | INTRAMUSCULAR | Status: AC
  Administered 2015-03-19: 1 mg via INTRAVENOUS
  Filled 2015-03-19: qty 1

## 2015-03-19 MED ORDER — TAMSULOSIN HCL 0.4 MG PO CAPS: 0.4000 mg | ORAL_CAPSULE | Freq: Every day | ORAL | Status: DC

## 2015-03-19 MED ORDER — ONDANSETRON HCL 4 MG/2ML IJ SOLN
4.0000 mg | Freq: Once | INTRAMUSCULAR | Status: AC
  Administered 2015-03-19: 4 mg via INTRAVENOUS
  Filled 2015-03-19: qty 2

## 2015-03-19 MED ORDER — DEXTROSE 5 % IV SOLN
1.0000 g | Freq: Once | INTRAVENOUS | Status: AC
  Administered 2015-03-19: 1 g via INTRAVENOUS

## 2015-03-19 MED ORDER — HYDROCODONE-ACETAMINOPHEN 5-325 MG PO TABS: 2.0000 | ORAL_TABLET | ORAL | Status: DC | PRN

## 2015-03-19 MED ORDER — SULFAMETHOXAZOLE-TRIMETHOPRIM 800-160 MG PO TABS: 1.0000 | ORAL_TABLET | Freq: Two times a day (BID) | ORAL | Status: AC

## 2015-03-19 MED ORDER — OXYCODONE-ACETAMINOPHEN 5-325 MG PO TABS
2.0000 | ORAL_TABLET | Freq: Once | ORAL | Status: DC
  Filled 2015-03-19: qty 2

## 2015-03-19 MED ORDER — POTASSIUM CHLORIDE CRYS ER 20 MEQ PO TBCR
40.0000 meq | EXTENDED_RELEASE_TABLET | Freq: Once | ORAL | Status: AC
  Administered 2015-03-19: 40 meq via ORAL
  Filled 2015-03-19: qty 2

## 2015-03-19 MED ORDER — ONDANSETRON 4 MG PO TBDP: 4.0000 mg | ORAL_TABLET | Freq: Three times a day (TID) | ORAL | Status: DC | PRN

## 2015-03-19 MED ORDER — KETOROLAC TROMETHAMINE 15 MG/ML IJ SOLN
15.0000 mg | Freq: Once | INTRAMUSCULAR | Status: AC
Start: 2015-03-19 — End: 2015-03-19
  Administered 2015-03-19: 15 mg via INTRAVENOUS
  Filled 2015-03-19: qty 1

## 2015-03-19 NOTE — ED Notes (Signed)
Pt continues to request pain medication. EDP notified.

## 2015-03-19 NOTE — ED Notes (Signed)
Blood in urine and nausea for one week. Left side lower groin pain radiating to back

## 2015-03-19 NOTE — ED Notes (Signed)
Pt requesting pain medication - notified EDP.

## 2015-03-19 NOTE — ED Notes (Signed)
Paged Dr. Louis Meckel to [phone] for admit.  Not calling [facility] back.

## 2015-03-19 NOTE — ED Notes (Signed)
Orders noted for Foley Cath, UA at time of discharge. After speaking to patient, patient does not want Foley due to her ability to urinate. Awaiting EDP to exit another patients room to question order.

## 2015-03-19 NOTE — Discharge Instructions (Signed)
°Emergency Department Resource Guide °1) Find a Doctor and Pay Out of Pocket °Although you won't have to find out who is covered by your insurance plan, it is a good idea to ask around and get recommendations. You will then need to call the office and see if the doctor you have chosen will accept you as a new patient and what types of options they offer for patients who are self-pay. Some doctors offer discounts or will set up payment plans for their patients who do not have insurance, but you will need to ask so you aren't surprised when you get to your appointment. ° °2) Contact Your Local Health Department °Not all health departments have doctors that can see patients for sick visits, but many do, so it is worth a call to see if yours does. If you don't know where your local health department is, you can check in your phone book. The CDC also has a tool to help you locate your state's health department, and many state websites also have listings of all of their local health departments. ° °3) Find a Walk-in Clinic °If your illness is not likely to be very severe or complicated, you may want to try a walk in clinic. These are popping up all over the country in pharmacies, drugstores, and shopping centers. They're usually staffed by nurse practitioners or physician assistants that have been trained to treat common illnesses and complaints. They're usually fairly quick and inexpensive. However, if you have serious medical issues or chronic medical problems, these are probably not your best option. ° °No Primary Care Doctor: °- Call [facility] at  832-8000 - they can help you locate a primary care doctor that  accepts your insurance, provides certain services, etc. °- Physician Referral Service- 1-800-533-3463 ° °Chronic Pain Problems: °Organization         Address  Phone   Notes  °[facility]  (336) 297-2271 Patients need to be referred by their primary care doctor.  ° °Medication  Assistance: °Organization         Address  Phone   Notes  °[place] [facility] [address] °[place], [place] [address] (336) 641-8030 --Must be a resident of [place] °-- Must have NO insurance coverage whatsoever (no Medicaid/ Medicare, etc.) °-- The pt. MUST have a primary care doctor that directs their care regularly and follows them in the community °  °[facility]  (866) 331-1348   °[employer]  (888) 892-1162   ° °Agencies that provide inexpensive medical care: °Organization         Address  Phone   Notes  °[facility]  (336) 832-8035   °[facility]    (336) 832-7272   °[facility] [address] °[place], [place] [address] (336) 832-4777   °[facility] [address], °[place] (336) 271-4999   °[facility]    (336) 373-0678   °[facility]    (336) 272-1050   °[facility] ° [address], [place] Phone:  (336) 832-4444, Fax:  (336) 832-4440 Hours of Operation:  9 am - 6 pm, M-F.  Also accepts Medicaid/Medicare and self-pay.  °[facility] ° [address], [place] Phone: (336) 832-3150, Fax: (336) 832-3151. Hours of Operation:  8:30 am - 5:30 pm, M-F.  Also accepts Medicaid and self-pay.  °[facility] [place] [address], [place] Phone: (336) 878-6027   °[facility] [address], [place], [place] (336)723-1848, Ext. 123 Mondays & Thursdays: 7-9 AM.  First 15 patients are seen on a first come, first serve basis. °  ° °Medicaid-accepting [place] Providers: ° °Organization         Address  Phone   Notes  °[facility] [address], [place] (336) 641-2100 Also accepts self-pay patients.  °[facility] [address], [place] ° (336) 856-9996   °[facility] [address], [place]  (336) 288-8857   °[facility] [address] [place] [address], [place] (336) 299-7000   °Veita Bland [address], [place]  ° (336) 373-1557 Only accepts [place] Access Medicaid patients after they have their name applied to their card.  ° °Self-Pay (no insurance) in [place]: ° °Organization         Address  Phone   Notes  °Sickle Cell Patients, [facility] [address], [place] (336) 832-1970   °[facility] [address], [place] (336) 832-4400   °[facility] ° [address], [place] (336) 992-4800   °[facility]/Dr. Osei-Bonsu ° [address] [place] [address], [place] or [address], [place] (336) 841-8500 Phone number for both [place] and [place] locations is the same.  °[facility] [address], [place] (336) 299-0000   °[facility] [address] [place] [address], [place] or [address] (336) 852-7530 °(336) 878-2260   °[facility] [address], [place] (336) 350-1642, phone; (336) 294-5005, fax Sees patients 1st and 3rd Saturday of every month.  Must not qualify for public or private insurance (i.e. Medicaid, Medicare, [place] Health Choice, Veterans' Benefits) • Household income should be no more than 200% of the poverty level •The clinic cannot treat you if you are pregnant or think you are pregnant • Sexually transmitted diseases are not treated at the clinic.  ° ° °Dental Care: °Organization         Address  Phone  Notes  °[place] [facility] [address], [place] (336) 641-6152 Accepts children up to age 21 who are enrolled in Medicaid or [place] Health Choice; pregnant women with a Medicaid card; and children who have applied for Medicaid or [place] Health Choice, but were declined, whose parents can pay a reduced fee at time of service.  °[place]  [facility] [place]  [address], [place] (336) 641-7733 Accepts children up to age 21 who are enrolled in Medicaid or [place] Health Choice; pregnant women with a Medicaid card; and children who have applied for Medicaid or [place] Health Choice, but were declined, whose parents can pay a reduced fee at time of service.  °[facility] ° [address],  (336) 641-4533 Patients are seen by appointment only. Walk-ins are not accepted. [facility] will see patients 18 years of age and older. °Monday - Tuesday (8am-5pm) °Most Wednesdays (8:30-5pm) °$30 per visit, cash only  °[facility] ° [address], [place] (336) 641-4533 Patients are seen by appointment only. Walk-ins are not accepted. [facility] will see patients 18 years of age and older. °One   Wednesday Evening (Monthly: Volunteer Based).  $30 per visit, cash only  °[facility]  (919) 537-3737 for adults; Children under age 4, call Graduate Pediatric Dentistry at (919) [phone]. Children aged 4-14, please call (919) [phone] to request a pediatric application. ° Dental services are provided in all areas of dental care including fillings, crowns and bridges, complete and partial dentures, implants, gum treatment, root canals, and extractions. Preventive care is also provided. Treatment is provided to both adults and children. °Patients are selected via a lottery and there is often a waiting list. °  °[facility] [address], °[place] ° (336) 763-8833 [email or web address] °  °[facility] [address], [place], [place] (336)723-1848, Ext. 123 Second and Fourth Thursday of each month, opens at 6:30 AM; Clinic ends at 9 AM.  Patients are seen on a first-come first-served basis, and a limited number are seen during each clinic.  ° °[facility] ° [address], [place], [place] (336) 723-7904    Eligibility Requirements °You must have lived in [place], [place], or [place] counties for at least the last three months. °  You cannot be eligible for state or federal sponsored healthcare insurance, including Veterans Administration, Medicaid, or Medicare. °  You generally cannot be eligible for healthcare insurance through your employer.  °  How to apply: °Eligibility screenings are held every Tuesday and Wednesday afternoon from 1:00 pm until 4:00 pm. You do not need an appointment for the interview!  °[facility] [address], [place], [place] 336-631-2330   °[facility] [facility]  336-342-8273   °[facility]  336-703-3100   °[facility]  336-570-6415   ° °Behavioral Health Resources in the Community: °Intensive Outpatient Programs °Organization         Address  Phone  Notes  °[facility] [address], [place], [place] 336-878-6098   °[facility] [address], [place], [place] 336-832-9800   °[facility] [address], [place], [place] ° 336-882-2125   °[facility] [address],  °[place], [place] 1-800-853-5163 or 336-641-4981   °Substance Abuse Resources °Organization         Address  Phone  Notes  °[facility]  336-882-2125   °[facility]  336-784-9470   °[facility]  336-285-9073   °[facility]  336-845-3988   °Residential & Outpatient Substance Abuse Program  1-800-659-3381   °Psychological Services °Organization         Address  Phone  Notes  °[facility]  336- 832-9600   °[facility]  336- 378-7881   °[facility] [address], [place] 1-800-853-5163 or 336-641-4981   ° °Mobile Crisis Teams °Organization         Address  Phone  Notes  °[facility], Mobile Crisis Care Unit  1-877-626-1772   °Assertive °[facility] ° [address]  [place], [place] 336-834-9664   °Sharon DeEsch [address] °[place] 336-554-5454   ° °Self-Help/Support Groups °Organization         Address  Phone             Notes  °[facility] [place] - variety of support groups  336- 373-1402 Call for more information  °Narcotics Anonymous (NA), [facility] [address], °[place]  2 meetings at this location  ° °  Residential Treatment Programs °Organization         Address  Phone  Notes  °[facility] [address],    °[place]  1-866-801-8205   °[facility] ° [address], [place], [place] 704-293-8524   °[facility] [address], [place] 336-845-3988 Admissions: 8am-3pm M-F  °[facility] [address]    °[place], [place] 336-841-1104   °[facility] [address], [place], [place] 336-379-7146   °[facility] [address]  °[place], [place] 336-285-9073   °[facility] Intensive Outpatient [address], [place], [place] 336-852-3033   °[facility] [address]  °[place], [place] 1-877-615-2722 or 336-784-9470   °[facility] [address] , [place] 336-227-7417 Accepts Medicaid  °[facility] [address]  °[place] 1-800-659-3381 Substance Abuse/Addiction Treatment  ° °[place] Behavioral Health Resources °Organization         Address  Phone  Notes  °[facility]  (888) 581-9988   °Julie Brannon, PhD [address] [place], [place]   (336) 349-5553 or (336) 951-0000   °[facility]   [address] °[place], [place] (336) 349-4454   °[facility] [address], [place], [place] (336) 342-8316 Insurance/Medicaid/sponsorship through [facility]  °[facility] [address]                                    [place], [place] (336) 342-8316 Therapy/tele-psych/case    °[facility] [address]  ° [place], [place] (336) 349-2233    °Dr. Arfeen  (336) 349-4544   °[facility] [place]  [employer] [place] [facility] 1) [address], [place] °2) [address], [place] °3)  [address], [place] (336) 349-3220 °(336) 342-7768 ° °(336) 342-8140   °[place] [facility] (336) 342-1394 or (336) 342-3537 (After Hours)    ° ° °

## 2015-03-19 NOTE — ED Provider Notes (Signed)
CSN: [id number]     Arrival date & time 03/19/15  1042 History   First MD Initiated Contact with Patient 03/19/15 1200     Chief Complaint  Patient presents with  . Urinary Tract Infection     (Consider location/radiation/quality/duration/timing/severity/associated sxs/prior Treatment) HPI Comments: Fibromyxoid sarcoma, had surgery 2011, followed by [facility] in [place] in June  Possible left kidney mass, 6/14 week went to [place], went [place] week before Imaging in [place] showed no sign of malignancy        Patient is a 59 y.o. female presenting with urinary tract infection and abdominal pain.  Urinary Tract Infection Urinary symptoms: hematuria (monday to yesterday)   Associated symptoms: abdominal pain and nausea   Associated symptoms: no fever, no vaginal discharge and no vomiting   Abdominal Pain Pain location:  LLQ Pain quality: sharp   Pain radiates to:  Does not radiate Pain severity:  Severe Duration:  4 hours Timing:  Constant Progression:  Worsening Chronicity:  New Associated symptoms: nausea   Associated symptoms: no chest pain, no cough, no diarrhea, no dysuria, no fever, no shortness of breath, no sore throat, no vaginal bleeding, no vaginal discharge and no vomiting     Past Medical History  Diagnosis Date  . Hypertension   . Cancer   . SVD (spontaneous vaginal delivery)     x 5  . Hypothyroidism   . Osteoarthritis   . Anemia    Past Surgical History  Procedure Laterality Date  . Wisdom tooth extraction    . Dilation and curettage of uterus      polyps  . Colonoscopy    . Right thigh surgery      cancer  . Dilatation & currettage/hysteroscopy with resectocope N/A 07/15/2014    Procedure: [place];  Surgeon: Marvene Staff, MD;  Location: [facility];  Service: Gynecology;  Laterality: N/A;   Family History  Problem Relation Age of Onset  . Hypertension Mother    . CVA Mother   . Cancer Father   . Diabetes Sister   . Diabetes Father   . Heart attack Sister    History  Substance Use Topics  . Smoking status: Never Smoker   . Smokeless tobacco: Never Used  . Alcohol Use: 0.0 oz/week    0 Standard drinks or equivalent per week     Comment: social - wine   OB History    No data available     Review of Systems  Constitutional: Negative for fever.  HENT: Negative for sore throat.   Eyes: Negative for visual disturbance.  Respiratory: Negative for cough and shortness of breath.   Cardiovascular: Negative for chest pain.  Gastrointestinal: Positive for nausea and abdominal pain. Negative for vomiting and diarrhea.  Genitourinary: Negative for dysuria, vaginal bleeding, vaginal discharge and difficulty urinating.  Musculoskeletal: Negative for back pain and neck pain.  Skin: Negative for rash.  Neurological: Negative for syncope and headaches.      Allergies  Codeine and Vicodin  Home Medications   Prior to Admission medications   Medication Sig Start Date End Date Taking? Authorizing Provider  cholecalciferol (VITAMIN D) 1000 UNITS tablet Take 1,000 Units by mouth daily.   Yes Historical Provider, MD  albuterol (PROVENTIL HFA;VENTOLIN HFA) 108 (90 BASE) MCG/ACT inhaler Inhale 2 puffs into the lungs every 6 (six) hours as needed for wheezing.    Historical Provider, MD  DULoxetine (CYMBALTA)  30 MG capsule Take 1 capsule (30 mg total) by mouth daily. 01/06/15   Donika Keith Rake, DO  hydrochlorothiazide (HYDRODIURIL) 25 MG tablet Take 25 mg by mouth daily.    Historical Provider, MD  levothyroxine (SYNTHROID, LEVOTHROID) 88 MCG tablet Take 88 mcg by mouth daily before breakfast.    Historical Provider, MD  Multiple Vitamin (MULTIVITAMIN WITH MINERALS) TABS Take 1 tablet by mouth daily.    Historical Provider, MD  vitamin E 400 UNIT capsule Take by mouth.    Historical Provider, MD   BP 138/91 mmHg  Pulse 73  Temp(Src) 98.8 F (37.1 C)  (Oral)  Resp 18  Ht 5\' 8"  (1.727 m)  Wt 200 lb (90.719 kg)  BMI 30.42 kg/m2  SpO2 100% Physical Exam  Constitutional: She is oriented to person, place, and time. She appears well-developed and well-nourished. No distress.  HENT:  Head: Normocephalic and atraumatic.  Eyes: Conjunctivae and EOM are normal.  Neck: Normal range of motion.  Cardiovascular: Normal rate, regular rhythm, normal heart sounds and intact distal pulses.  Exam reveals no gallop and no friction rub.   No murmur heard. Pulmonary/Chest: Effort normal and breath sounds normal. No respiratory distress. She has no wheezes. She has no rales.  Abdominal: Soft. She exhibits no distension. There is tenderness. There is CVA tenderness (l). There is no guarding, no tenderness at McBurney's point and negative Murphy's sign.  Musculoskeletal: She exhibits no edema or tenderness.  Neurological: She is alert and oriented to person, place, and time.  Skin: Skin is warm and dry. No rash noted. She is not diaphoretic. No erythema.  Nursing note and vitals reviewed.   ED Course  Procedures (including critical care time) Labs Review Labs Reviewed  URINALYSIS, ROUTINE W REFLEX MICROSCOPIC (NOT AT [facility]) - Abnormal; Notable for the following:    Leukocytes, UA MODERATE (*)    All other components within normal limits  URINE MICROSCOPIC-ADD ON - Abnormal; Notable for the following:    Bacteria, UA MANY (*)    All other components within normal limits  CBC WITH DIFFERENTIAL/PLATELET  COMPREHENSIVE METABOLIC PANEL    Imaging Review No results found.   EKG Interpretation None      MDM   Final diagnoses:  None   59yo female with history of fibromyxoid sarcoma, hypertension presents with concern for left groin pain and prior hematuria.  CT stone study shows 63mm left UVJ calculus with moderate left hydronephrosis.  Urinalysis concerning for UTI. Mild elevation in Cr 1.19 (previous 1.10 in Jan, .9 in November.)  Discussed with  Urology Dr. Louis Meckel and will given 15mg  toradol, bactrim and he will call patient for close outpatient follow up.  Patient toleration po, afebrile, not septic and appropriate for outpatient follow up for nephrolithaisis with UTI.  Patient discharged in stable condition with understanding of reasons to return.      Gareth Morgan, MD 03/20/15 1349

## 2015-03-19 NOTE — ED Notes (Signed)
Questioned EDP about [facility] order, EDP states order placed on wrong patient. Pt updated, Order discontinued.

## 2015-03-21 LAB — URINE CULTURE

## 2015-03-25 ENCOUNTER — Encounter: Payer: Self-pay | Admitting: Physical Medicine & Rehabilitation

## 2015-03-31 ENCOUNTER — Telehealth: Payer: Self-pay | Admitting: Neurology

## 2015-03-31 NOTE — Telephone Encounter (Signed)
Pt states that she is returning a call she believes that Caryl Pina called her please call her at [phone] or [phone]

## 2015-03-31 NOTE — Telephone Encounter (Signed)
Called patient back to make sure that she is aware of her physical medicine appointment.  Patient aware and has received new patient package in the mail.

## 2015-04-27 ENCOUNTER — Encounter: Payer: Self-pay | Admitting: Physical Medicine & Rehabilitation

## 2015-04-27 ENCOUNTER — Other Ambulatory Visit: Payer: Self-pay | Admitting: Physical Medicine & Rehabilitation

## 2015-04-27 ENCOUNTER — Encounter: Payer: 59 | Attending: Physical Medicine & Rehabilitation

## 2015-04-27 ENCOUNTER — Ambulatory Visit (HOSPITAL_BASED_OUTPATIENT_CLINIC_OR_DEPARTMENT_OTHER): Payer: 59 | Admitting: Physical Medicine & Rehabilitation

## 2015-04-27 VITALS — BP 130/87 | HR 76

## 2015-04-27 DIAGNOSIS — Z5181 Encounter for therapeutic drug level monitoring: Secondary | ICD-10-CM

## 2015-04-27 DIAGNOSIS — Z79899 Other long term (current) drug therapy: Secondary | ICD-10-CM | POA: Diagnosis not present

## 2015-04-27 DIAGNOSIS — Z8589 Personal history of malignant neoplasm of other organs and systems: Secondary | ICD-10-CM

## 2015-04-27 DIAGNOSIS — M7062 Trochanteric bursitis, left hip: Secondary | ICD-10-CM | POA: Insufficient documentation

## 2015-04-27 DIAGNOSIS — G894 Chronic pain syndrome: Secondary | ICD-10-CM

## 2015-04-27 DIAGNOSIS — M79651 Pain in right thigh: Secondary | ICD-10-CM | POA: Diagnosis present

## 2015-04-27 DIAGNOSIS — Z9889 Other specified postprocedural states: Secondary | ICD-10-CM | POA: Diagnosis not present

## 2015-04-27 DIAGNOSIS — R202 Paresthesia of skin: Secondary | ICD-10-CM | POA: Diagnosis not present

## 2015-04-27 DIAGNOSIS — M5136 Other intervertebral disc degeneration, lumbar region: Secondary | ICD-10-CM | POA: Diagnosis not present

## 2015-04-27 DIAGNOSIS — Z85831 Personal history of malignant neoplasm of soft tissue: Secondary | ICD-10-CM | POA: Insufficient documentation

## 2015-04-27 MED ORDER — TRAMADOL HCL 50 MG PO TABS: 50.0000 mg | ORAL_TABLET | Freq: Two times a day (BID) | ORAL | Status: DC

## 2015-04-27 MED ORDER — PREGABALIN 50 MG PO CAPS: 50.0000 mg | ORAL_CAPSULE | Freq: Two times a day (BID) | ORAL | Status: DC

## 2015-04-27 NOTE — Progress Notes (Signed)
Subjective:    Patient ID: Whitney Daniel, female    DOB: 09-24-1955, 59 y.o.   MRN: [id number]  HPI Whitney Daniel has a score of 19 on her phq9.  She was previously on cymbalta but stopped it because she did not like the way it made her feel.  She is not in a counseling program.   Pain Inventory Average Pain 7 Pain Right Now 7 My pain is constant, sharp, burning, stabbing, tingling and aching  In the last 24 hours, has pain interfered with the following? General activity 10 Relation with others 9 Enjoyment of life 10 What TIME of day is your pain at its worst? all Sleep (in general) Poor  Pain is worse with: walking, bending, sitting and standing Pain improves with: nothing Relief from Meds: 0  Mobility use a cane do you drive?  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  no  Physicians involved in your care Any changes since last visit?  no   Family History  Problem Relation Age of Onset  . Hypertension Mother   . CVA Mother   . Cancer Father   . Diabetes Sister   . Diabetes Father   . Heart attack Sister    Social History   Social History  . Marital Status: Legally Separated    Spouse Name: N/A  . Number of Children: N/A  . Years of Education: N/A   Social History Main Topics  . Smoking status: Never Smoker   . Smokeless tobacco: Never Used  . Alcohol Use: 0.0 oz/week    0 Standard drinks or equivalent per week     Comment: social - wine  . Drug Use: No  . Sexual Activity: Yes    Birth Control/ Protection: Post-menopausal   Other Topics Concern  . None   Social History Narrative   Lives alone in a one story home.  Has 5 children.  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    Past Surgical History  Procedure Laterality Date  . Wisdom tooth extraction    . Dilation and curettage of uterus      polyps  . Colonoscopy    . Right thigh surgery      cancer  . Dilatation & currettage/hysteroscopy with resectocope N/A 07/15/2014    Procedure: [place];  Surgeon: Marvene Staff, MD;  Location: [facility];  Service: Gynecology;  Laterality: N/A;   Past Medical History  Diagnosis Date  . Hypertension   . Cancer   . SVD (spontaneous vaginal delivery)     x 5  . Hypothyroidism   . Osteoarthritis   . Anemia    BP 130/87 mmHg  Pulse 76  SpO2 100%  Opioid Risk Score:   Fall Risk Score:  `1  Depression screen PHQ 2/9  Depression screen PHQ 2/9 04/27/2015  Decreased Interest 3  Down, Depressed, Hopeless 1  PHQ - 2 Score 4  Altered sleeping 3  Tired, decreased energy 3  Change in appetite 2  Feeling bad or failure about yourself  1  Trouble concentrating 1  Moving slowly or fidgety/restless 3  Suicidal thoughts 2  PHQ-9 Score 19  Difficult doing work/chores Extremely dIfficult     Review of Systems  Respiratory: Positive for cough.   Musculoskeletal: Positive for gait problem.  Neurological: Positive for tremors, weakness and numbness.       Tingling  Psychiatric/Behavioral: Positive for dysphoric mood. The patient is nervous/anxious.   All other systems reviewed and are negative.      Objective:   Physical Exam        Assessment & Plan:

## 2015-04-27 NOTE — Patient Instructions (Signed)
Please do as many activities as you can

## 2015-04-27 NOTE — Progress Notes (Signed)
Subjective:    Patient ID: Whitney Daniel, female    DOB: 18-Apr-1956, 59 y.o.   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 She had left lower extremity chronic paresthesias that had been managed with nortriptyline and gabapentin for a couple months in 2014 but the patient started developing Side effects to these medications and these were discontinued Patient had additional symptoms of tingling in the left scalp arm and leg as well as the right thigh. She's had additional workup including MRI of the right posterior thigh at [facility] which is where she had her surgery. This showed no recurrence. She has had Brain MRI/MRA ordered by neurologist in [place] which showed no abnormalities. Also EMG/NCV of upper and lower extremities revealed no evidence of peripheral neuropathy, radiculopathy or other peripheral nerve findings. Patient has gone through physical therapy mainly for neck pain As well as left hip pain. Her left hip pain has started after she has been sitting basically on the left hip to unweight the right hip and right posterior thigh..  Left hip started to hurt received injection, No significant relief RIght knee torn meniscus, plans for arthroscopic surgery  In addition patient had MRI of the lumbar spine dated 01/15/2015 which showed disc bulging at L4-5, L5-S1 but no nerve root compression.This study was reviewed by me personally  Review of Systems Please see additional note from today    Objective:   Physical Exam  Constitutional: She is oriented to person, place, and time. She appears well-developed and well-nourished.  HENT:  Head: Normocephalic and atraumatic.  Eyes: Conjunctivae and EOM are normal. Pupils are equal, round, and reactive to light.  Neck: Normal range of motion.  Neurological: She is alert and oriented to  person, place, and time. She has normal strength.  Reflex Scores:      Tricep reflexes are 2+ on the right side and 2+ on the left side.      Bicep reflexes are 2+ on the right side and 2+ on the left side.      Brachioradialis reflexes are 2+ on the right side and 2+ on the left side.      Patellar reflexes are 2+ on the right side and 2+ on the left side.      Achilles reflexes are 1+ on the right side and 1+ on the left side. Normal sensation to pinprick in bilateral lower limbs. Hypersensitivity to touch in posterior thigh to pinprick as well as posterior calf  Psychiatric: She has a normal mood and affect.  Nursing note and vitals reviewed.   Positive Apley grind test right knee Negative femoral stretch test Negative straight leg raising test Moderate tenderness to palpation right posterior thigh upper aspect Bilateral knees without evidence of effusion There is normal hip range of motion bilaterally Normal ankle and foot range of motion bilaterally Spine range of motion 75% flexion and extension lateral bending rotation Decreased weight-bearing right lower extremity Standing balance is good No tenderness palpation along the cervical thoracic or lumbar spine. Upper extremity range of motion is normal no evidence of joint effusion No evidence of joint effusion in the lower extremities.        Assessment & Plan:  1.  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 This includes left hip trochanteric bursitis,problems with right knee  meniscal injury.  Patient has had adequate workup of her complaints. Does not appear anything new is going on. EMG/NCV did not show any significant nerve damage the sciatic nerve so this is mainly a pain issue. I do not think physical therapy for the sciatic neuropathyWould be of  benefit. I believe she is developed supersensitivity to pain and would need a neuro modulating agent such as Lyrica 50 mg twice a day. I also recommend right L5 and S1 transforaminal injections to block nerve impulses at least on a temporary basis.. She also may benefit from a peripheral nerve stimulator if more conservative care is not helpful  2. Lumbar degenerative disc she is having some axial pain. She may benefit from physical therapy for this problem, We'll hold off until there is better control of the right posterior thigh pain. Start low-dose tramadol 50 g twice a day

## 2015-04-28 LAB — PMP ALCOHOL METABOLITE (ETG): ETGU: NEGATIVE ng/mL

## 2015-04-30 DIAGNOSIS — S83249A Other tear of medial meniscus, current injury, unspecified knee, initial encounter: Secondary | ICD-10-CM | POA: Insufficient documentation

## 2015-05-01 LAB — MEPERIDINE (GC/LC/MS), URINE
MEPERIDINE UR CONFIRM: NEGATIVE ng/mL (ref <100)
Normeperidine (GC/LC/MS), ur confirm: 502 ng/mL — AB (ref <100)

## 2015-05-04 LAB — PRESCRIPTION MONITORING PROFILE (SOLSTAS)
Amphetamine/Meth: NEGATIVE ng/mL
Barbiturate Screen, Urine: NEGATIVE ng/mL
Benzodiazepine Screen, Urine: NEGATIVE ng/mL
Buprenorphine, Urine: NEGATIVE ng/mL
CANNABINOID SCRN UR: NEGATIVE ng/mL
CARISOPRODOL, URINE: NEGATIVE ng/mL
COCAINE METABOLITES: NEGATIVE ng/mL
Creatinine, Urine: 268.79 mg/dL (ref >20.0)
ECSTASY: NEGATIVE ng/mL
FENTANYL URINE: NEGATIVE ng/mL
Methadone Screen, Urine: NEGATIVE ng/mL
Nitrites, Initial: NEGATIVE ug/mL
Opiate Screen, Urine: NEGATIVE ng/mL
Oxycodone Screen, Ur: NEGATIVE ng/mL
Propoxyphene: NEGATIVE ng/mL
Tapentadol, urine: NEGATIVE ng/mL
Tramadol Scrn, Ur: NEGATIVE ng/mL
Zolpidem, Urine: NEGATIVE ng/mL
pH, Initial: 5.3 pH (ref 4.5–8.9)

## 2015-05-11 NOTE — Progress Notes (Signed)
Urine drug screen for this encounter is consistent for prescribed medication. She has a history of being prescribed meperidine.

## 2015-06-04 ENCOUNTER — Encounter: Payer: 59 | Attending: Physical Medicine & Rehabilitation

## 2015-06-04 ENCOUNTER — Encounter: Payer: Self-pay | Admitting: Physical Medicine & Rehabilitation

## 2015-06-04 ENCOUNTER — Ambulatory Visit (HOSPITAL_BASED_OUTPATIENT_CLINIC_OR_DEPARTMENT_OTHER): Payer: 59 | Admitting: Physical Medicine & Rehabilitation

## 2015-06-04 VITALS — BP 147/62 | HR 72 | Resp 16

## 2015-06-04 DIAGNOSIS — Z9889 Other specified postprocedural states: Secondary | ICD-10-CM | POA: Diagnosis not present

## 2015-06-04 DIAGNOSIS — M7062 Trochanteric bursitis, left hip: Secondary | ICD-10-CM | POA: Diagnosis not present

## 2015-06-04 DIAGNOSIS — M5136 Other intervertebral disc degeneration, lumbar region: Secondary | ICD-10-CM | POA: Diagnosis not present

## 2015-06-04 DIAGNOSIS — M79651 Pain in right thigh: Secondary | ICD-10-CM | POA: Diagnosis present

## 2015-06-04 DIAGNOSIS — R202 Paresthesia of skin: Secondary | ICD-10-CM

## 2015-06-04 MED ORDER — DIAZEPAM 10 MG PO TABS: 10.0000 mg | ORAL_TABLET | Freq: Four times a day (QID) | ORAL | Status: DC | PRN

## 2015-06-04 NOTE — Progress Notes (Signed)
L5 and S1 transforaminal epidural steroid injection under fluoroscopic guidance  Indication: Lumbosacral radiculitis is not relieved by medication management or other conservative care and interfering with self-care and mobility.   Informed consent was obtained after describing risk and benefits of the procedure with the patient, this includes bleeding, bruising, infection, paralysis and medication side effects.  The patient wishes to proceed and has given written consent.  Patient was placed in prone position.  The lumbar area was marked and prepped with Betadine.  It was entered with a 25-gauge 1-1/2 inch needle and one mL of 1% lidocaine was injected into the skin and subcutaneous tissue.  Then a 22-gauge 5 inch spinal needle was inserted into the R L5-S1 intervertebral foramen under AP, lateral, and oblique view.  Then a solution containing one mL of 10 mg per mL dexamethasone and 2 mL of 1% lidocaine was injected.The same procedures. At the right S1 sacral foramen with same needle injected and technique. The patient tolerated procedure well.  Post procedure instructions were given.  Please see post procedure form.

## 2015-06-04 NOTE — Progress Notes (Signed)
  [facility]   Name: KARIMA CARRELL DOB:07/03/1956 MRN: [id number]  Date:06/04/2015  Physician: Alysia Penna, MD    Nurse/CMA: Shumaker RN  Allergies:  Allergies  Allergen Reactions  . Codeine Other (See Comments)    Patient states "she did not like the way codeine made her feel"    . Vicodin [Hydrocodone-Acetaminophen] Nausea And Vomiting    Doesn't like how it makes her feel but can take if needs to for pain    Consent Signed: Yes.    Is patient diabetic? No.  CBG today?   Pregnant: No. LMP: No LMP recorded. Patient is postmenopausal. (age 36-55)  Anticoagulants: no Anti-inflammatory: no Antibiotics: no  Procedure: right lumbar 5 sacral 1 transforaminal epidural steroid injection Position: Prone Start Time: 120 End Time: 132 Fluoro Time: 22  RN/CMA Biomedical engineer    Time 115 140    BP 147/67 148/82    Pulse 72 66    Respirations 16 16    O2 Sat 97 99    S/S 6 6    Pain Level 7/10 numb     D/C home with husband , patient A & O X 3, D/C instructions reviewed, and sits independently.

## 2015-06-04 NOTE — Patient Instructions (Signed)
L5 and S1 transforaminal epidural steroid injection under fluoroscopic guidance  You received an epidural steroid injection under fluoroscopic guidance. This is the most accurate way to perform an epidural injection. This injection was performed to relieve thigh or leg or foot pain that may be related to a pinched nerve in the lumbar spine. The local anesthetic injected today may cause numbness in your leg for a couple hours. If it is severe we may need to observe you for 30-60 minutes after the injection. The cortisone medicine injected today may take several days to take full effect. This medicine can also cause facial flushing or feeling of being warm.  This injection may last for days weeks or months. It can be repeated if needed. If it is not effective, another spinal level may need to be injected. Other treatments include medication management as well as physical therapy. In some cases surgery may be an option.

## 2015-06-14 ENCOUNTER — Other Ambulatory Visit: Payer: Self-pay | Admitting: *Deleted

## 2015-06-14 ENCOUNTER — Ambulatory Visit (INDEPENDENT_AMBULATORY_CARE_PROVIDER_SITE_OTHER): Payer: 59 | Admitting: Neurology

## 2015-06-14 ENCOUNTER — Encounter: Payer: Self-pay | Admitting: Neurology

## 2015-06-14 VITALS — BP 150/100 | HR 78 | Wt 205.6 lb

## 2015-06-14 DIAGNOSIS — R202 Paresthesia of skin: Secondary | ICD-10-CM

## 2015-06-14 DIAGNOSIS — R29818 Other symptoms and signs involving the nervous system: Secondary | ICD-10-CM | POA: Diagnosis not present

## 2015-06-14 DIAGNOSIS — S7400XA Injury of sciatic nerve at hip and thigh level, unspecified leg, initial encounter: Secondary | ICD-10-CM | POA: Insufficient documentation

## 2015-06-14 DIAGNOSIS — S7401XS Injury of sciatic nerve at hip and thigh level, right leg, sequela: Secondary | ICD-10-CM

## 2015-06-14 DIAGNOSIS — M5442 Lumbago with sciatica, left side: Secondary | ICD-10-CM | POA: Diagnosis not present

## 2015-06-14 NOTE — Patient Instructions (Signed)
Return to clinic as needed

## 2015-06-14 NOTE — Progress Notes (Signed)
Follow-up Visit   Date: 06/14/2015   Whitney Daniel MRN: [id number] DOB: 1956-08-22   Interim History: Whitney Daniel is a 59 y.o. 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  The patient was accompanied to the clinic by self.    History of present illness: Starting in early 2015, she developed numbness/tingling of the left scalp, arm and leg without any noticeable weakness. She is not dropping things or fallen. She denies any neck or back pain. Symptoms are daily. No identifiable triggers or alleviating factors. She does endorse that stress makes the sensation worse. She reports having intermittent headache on the left side, but it is no severe to take medication.   She has chronic right leg pain due to fibromyxoid sarcoma s/p limb-sparing surgery ([facility], 2011) which involved a large area of her posterior thigh which was invasive locally to the neurovascular structures. She takes gabapentin 600mg  TID for paresthesias, but limiting weight bearing helps the most.  She also complains of abdominal trembling sensation lasting 2-minutes. She says that she cannot talking during these spells. It has only occurred three times, last occuring in the summer. No history of vision loss.  UPDATE 10/27/2014:  At her last visit, I ordered routine EEG, but patient no-showed to her appointment. MRI/A brain and neck returned normal.   She was recently diagnosed with rheumatoid arthritis and is scheduled to see a rheumatologist for further management.   She continues to feel weak all over the left arm and leg.   She complains of pressure over her leg especially when sitting, and was taking gabapentin 300mg  in the morning which helped her pain, but she felt very groggy and "high" so  stopped it.  She has no tried any other medication.  UPDATE 01/06/2015:  She stopped nortriptyline and gabapentin because of side effects.  She is having physical therapy for her neck and left hip.  She is here today because of worsening right leg pain which is related to her prior surgery.  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 Further, she also complains of sharp low back pain radiating into her left leg and foot.  Recent MRI of her posterior thigh performed at [facility] did not show any recurrence of malignancy.  UPDATE 06/14/2015:  Patient arrived 20 minutes late to a 30-min appointment.  She states that she is filing for disability because of chronic pain. 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   She is seeing Dr. Letta Pate and was started on Lyrica and tramadol and also received a nerve block and did not have much benefit from that.  She has a number of other chronic complains including left hemisensory changes, left hip pain, and right knee pain.  She tore her meniscus and is wearing a knee brace.  MRI of the lumbar spine dated 01/15/2015 showed disc bulging at L4-5 and L5-S1 without nerve root compression.  She is very concerned that the report states neural irritation and is the culprit of her pain, but I explained that the severity of these MRI findings are minimal and her pain is out of proportion  to her study results.  Her pain is most likely from scar tissue and post-surgical from her fibromyxoid sarcoma resection.     Medications:  Current Outpatient Prescriptions on File Prior to Visit  Medication Sig Dispense Refill  . albuterol (PROVENTIL HFA;VENTOLIN HFA) 108 (90 BASE) MCG/ACT inhaler Inhale 2 puffs into the lungs every 6 (six) hours as needed for wheezing.    . Biotin (RA BIOTIN) 2.5  MG CAPS Take by mouth.    . cholecalciferol (VITAMIN D) 1000 UNITS tablet Take 1,000 Units by mouth daily.    . cyclobenzaprine (FLEXERIL) 10 MG tablet Take by mouth.    . diazepam (VALIUM) 10 MG tablet Take 1 tablet (10 mg total) by mouth every 6 (six) hours as needed for anxiety. 2 tablet 0  . hydrochlorothiazide (HYDRODIURIL) 25 MG tablet Take 25 mg by mouth daily.    Marland Kitchen levothyroxine (SYNTHROID, LEVOTHROID) 88 MCG tablet Take 88 mcg by mouth daily before breakfast.    . Multiple Vitamin (MULTIVITAMIN WITH MINERALS) TABS Take 1 tablet by mouth daily.    . naproxen (NAPROSYN) 250 MG tablet Take 250 mg by mouth.    . ondansetron (ZOFRAN ODT) 4 MG disintegrating tablet Take 1 tablet (4 mg total) by mouth every 8 (eight) hours as needed for nausea or vomiting. 20 tablet 0  . pregabalin (LYRICA) 50 MG capsule Take 1 capsule (50 mg total) by mouth 2 (two) times daily. 60 capsule 1  . traMADol (ULTRAM) 50 MG tablet Take 1 tablet (50 mg total) by mouth 2 (two) times daily. 60 tablet 1  . vitamin E 400 UNIT capsule Take by mouth.     No current facility-administered medications on file prior to visit.    Allergies:  Allergies  Allergen Reactions  . Codeine Other (See Comments)    Patient states "she did not like the way codeine made her feel"    . Vicodin [Hydrocodone-Acetaminophen] Nausea And Vomiting    Doesn't like how it makes her feel but can take if needs to for pain    Review of Systems:  CONSTITUTIONAL: No fevers, chills, night sweats, or weight loss.  EYES: No visual changes or eye pain ENT: No hearing changes.  No history of nose bleeds.   RESPIRATORY: No cough, wheezing and shortness of breath.   CARDIOVASCULAR: Negative for chest pain, and palpitations.   GI: Negative for abdominal discomfort, blood in stools or black stools.  No recent change in bowel habits.   GU:  No history of incontinence.   MUSCLOSKELETAL: +history of joint pain or swelling.  +myalgias.   SKIN: Negative  for lesions, rash, and itching.   ENDOCRINE: Negative for cold or heat intolerance, polydipsia or goiter.   PSYCH:  No depression or anxiety symptoms.   NEURO: As Above.   Vital Signs:  BP 150/100 mmHg  Pulse 78  Wt 205 lb 9 oz (93.243 kg)  SpO2 96%  Neurological Exam: MENTAL STATUS including orientation to time, place, person, recent and remote memory, attention span and concentration, language, and fund of knowledge is normal. She is tearful at times during the exam.   CRANIAL NERVES:  Pupils equal round and reactive to light.  Normal conjugate, extra-ocular eye movements in all directions of gaze.   Face is symmetric.   MOTOR:  Motor strength is 5/5 throughout, except mild pain related weakness of the right leg, which is supported in knee brace.   MSRs:  Reflexes are 2+/4 throughout.  COORDINATION/GAIT:  Gait is  slow and antalgic, supported with cane.  Data: Labs 06/08/2014: TSH 0.31*, HbA1c 6.3  MRI/A brain and MRA neck 09/14/2014:  No acute intracranial findings. Minor white matter signal abnormality, likely small vessel disease. No proximal flow reducing lesion of the intracranial or extracranial circulation.  EMG of the left side 10/29/2014:  1. Chronic C5 radiculopathy affecting the left upper extremity, mild in degree electrically. 2. There is no evidence of a generalized sensorimotor polyneuropathy, lumbosacral radiculopathy, or carpal tunnel syndrome affecting the left side.  MRI lumbar spine wo contrast 01/15/2015:  Degenerative disc disease and degenerative facet disease at L4-5 and L5-S1, more pronounced at the lower level. The findings could certainly relate to low back pain. Some narrowing of the lateral recesses at L5-S1 and to a lesser extent at L4-5 on the left could possibly be associated with neural irritation as well.      IMPRESSION/PLAN: 1.  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  -  It does not appear that she has had NCS/EMG of the right leg, as her previous primary complaints to me initially was left sided sensory changes and right leg pain was chronic, but this can be performed.  I suspect there will be some evidence of sciatic nerve injury, but unfortunately, it will not change management as this continues to be symptomatic.  She is interested in investigating this further so will proceed with EMG of the right leg.   -  Continue to follow-up with Dr. Letta Pate in PM&R for pain management   2.  Nonspecific left hemisensory changes of the face, arm, and leg with normal work-up  - MRI/A brain, MRA neck, and EEG is normal  - EMG with only left C5 radiculopathy (mild), no evidence of neuropathy or lumbosacral radiculopathy  - Offered second opinion at [facility], but she declined  3.  Intermittent spells of abdominal trembling and speech arrest  - Negative EEG  - Clinically improved  Return to clinic as needed  The duration of this appointment visit was 25 minutes of face-to-face time with the patient.  Greater than 50% of this time was spent in counseling, explanation of diagnosis, planning of further management, and coordination of care.   Thank you for allowing me to participate in patient's care.  If I can answer any additional questions, I would be pleased to do so.    Sincerely,    Donika K. Posey Pronto, DO

## 2015-06-14 NOTE — Progress Notes (Signed)
Please schedule

## 2015-06-15 ENCOUNTER — Ambulatory Visit (INDEPENDENT_AMBULATORY_CARE_PROVIDER_SITE_OTHER): Payer: 59 | Admitting: Neurology

## 2015-06-15 DIAGNOSIS — R29818 Other symptoms and signs involving the nervous system: Secondary | ICD-10-CM

## 2015-06-15 DIAGNOSIS — S7401XS Injury of sciatic nerve at hip and thigh level, right leg, sequela: Secondary | ICD-10-CM | POA: Diagnosis not present

## 2015-06-15 DIAGNOSIS — R202 Paresthesia of skin: Secondary | ICD-10-CM

## 2015-06-15 NOTE — Progress Notes (Addendum)
EMG of the right lower extremity 06/15/2015:  Normal, specifically no evidence of sciatic mononeuropathy.  With this said, it is likely that she has chronic localized pain from scar tissue or chronic regional pain syndrome. Her work-up to date has been extensive and does not show evidence of lumbosacral radiculopathy, lumbosacral plexopathy, or sciatic neuropathy.  Management remains supportive.  Jaaziah Schulke K. Posey Pronto, DO

## 2015-06-15 NOTE — Procedures (Signed)
[facility]  [place], [place]  [place], [place] [address] Tel: [phone] Fax:  [phone] Test Date:  06/15/2015  Patient: Whitney Daniel DOB: 03-18-1956 Physician: Narda Amber, DO  Sex: Female Height: 5\' 8"  Ref Phys: Narda Amber, DO  ID#: [id number] Temp: 34.5C Technician: Jerilynn Mages. Dean   Patient Complaints: This is a 59 year old female with right posterior thigh resection of fibromyxoma sarcoma presenting for evaluation of chronic thigh pain and paresthesias of the right leg.   NCV & EMG Findings: Extensive electrodiagnostic testing of the right lower extremity shows:  1. Right sural and superficial peroneal sensory responses are within normal limits.  Right peroneal and tibial motor responses are within normal limits. When compared to her previous EMG dated 10/29/2014 of the left lower extremity, the nerve conduction studies are symmetric. 2. Right tibial H reflex studies within normal limits. 3. Sparse chronic motor axon loss changes isolated to the right anterior tibialis and fibularis longus muscles, without accompanied active denervation. These findings are too limited and insufficient to make a definitive diagnosis. There is no evidence of active motor axon loss changes affecting any of the tested muscles.  Impression: This is essentially a normal study of the right lower extremity.   In particular, there is no evidence of a sciatic mononeuropathy, lumbosacral radiculopathy, or generalized sensorimotor polyneuropathy affecting the right lower extremity.   ___________________________ Narda Amber, DO    Nerve Conduction Studies Anti Sensory Summary Table   Site NR Peak (ms) Norm Peak (ms) P-T Amp (V) Norm P-T Amp  Right Sup Peroneal Anti Sensory (Ant Lat Mall)  34.5C  12 cm    3.6 <4.6 12.1 >4  Right Sural Anti Sensory (Lat Mall)  34.5C  Calf    4.0 <4.6 11.4 >4   Motor Summary Table   Site NR Onset (ms) Norm Onset (ms) O-P Amp (mV) Norm O-P  Amp Site1 Site2 Delta-0 (ms) Dist (cm) Vel (m/s) Norm Vel (m/s)  Right Peroneal Motor (Ext Dig Brev)  34.5C  Ankle    4.1 <6.0 7.5 >2.5 B Fib Ankle 8.6 37.0 43 >40  B Fib    12.7  6.4  Poplt B Fib 1.8 10.0 56 >40  Poplt    14.5  6.0         Right Tibial Motor (Abd Hall Brev)  34.5C  Ankle    4.7 <6.0 9.2 >4 Knee Ankle 10.1 43.0 43 >40  Knee    14.8  4.5          H Reflex Studies   NR H-Lat (ms) Lat Norm (ms) L-R H-Lat (ms)  Right Tibial (Gastroc)  34.5C     34.01 <35    EMG   Side Muscle Ins Act Fibs Psw Fasc Number Recrt Dur Dur. Amp Amp. Poly Poly. Comment  Right AntTibialis Nml Nml Nml Nml 1- Mod-R Few 1+ Few 1+ Nml Nml N/A  Right Fibularis Long Nml Nml Nml Nml 1- Mod-R Few 1+ Nml Nml Nml Nml N/A  Right Flex Dig Long Nml Nml Nml Nml Nml Nml Nml Nml Nml Nml Nml Nml N/A  Right Gastroc Nml Nml Nml Nml Nml Nml Nml Nml Nml Nml Nml Nml N/A  Right GluteusMed Nml Nml Nml Nml Nml Nml Nml Nml Nml Nml Nml Nml N/A  Right BicepsFemS Nml Nml Nml Nml Nml Nml Nml Nml Nml Nml Nml Nml N/A  Right VastusLat Nml Nml Nml Nml Nml Nml Nml Nml Nml Nml Nml Nml N/A  Right  RectFemoris Nml Nml Nml Nml Nml Nml Nml Nml Nml Nml Nml Nml N/A      Waveforms:

## 2015-06-17 ENCOUNTER — Telehealth: Payer: Self-pay | Admitting: Neurology

## 2015-06-17 NOTE — Telephone Encounter (Signed)
Patient wanted to know why she couldn't see her weight, etc. On mychart from our office. Gave support email from [place] for her to contact.

## 2015-06-17 NOTE — Telephone Encounter (Signed)
Pt called about visit info not being in/My [place]/call back @ [phone]

## 2015-07-05 ENCOUNTER — Ambulatory Visit (HOSPITAL_BASED_OUTPATIENT_CLINIC_OR_DEPARTMENT_OTHER): Payer: 59 | Admitting: Physical Medicine & Rehabilitation

## 2015-07-05 ENCOUNTER — Encounter: Payer: 59 | Attending: Physical Medicine & Rehabilitation

## 2015-07-05 ENCOUNTER — Encounter: Payer: Self-pay | Admitting: Physical Medicine & Rehabilitation

## 2015-07-05 VITALS — BP 144/84 | HR 72 | Resp 14

## 2015-07-05 DIAGNOSIS — M5417 Radiculopathy, lumbosacral region: Secondary | ICD-10-CM

## 2015-07-05 DIAGNOSIS — M7062 Trochanteric bursitis, left hip: Secondary | ICD-10-CM | POA: Diagnosis not present

## 2015-07-05 DIAGNOSIS — M79651 Pain in right thigh: Secondary | ICD-10-CM | POA: Diagnosis present

## 2015-07-05 DIAGNOSIS — M5136 Other intervertebral disc degeneration, lumbar region: Secondary | ICD-10-CM | POA: Insufficient documentation

## 2015-07-05 DIAGNOSIS — Z9889 Other specified postprocedural states: Secondary | ICD-10-CM | POA: Insufficient documentation

## 2015-07-05 DIAGNOSIS — M5416 Radiculopathy, lumbar region: Secondary | ICD-10-CM | POA: Insufficient documentation

## 2015-07-05 MED ORDER — DIAZEPAM 10 MG PO TABS: 10.0000 mg | ORAL_TABLET | Freq: Four times a day (QID) | ORAL | Status: DC | PRN

## 2015-07-05 MED ORDER — TRAMADOL HCL 50 MG PO TABS: 50.0000 mg | ORAL_TABLET | Freq: Two times a day (BID) | ORAL | Status: DC

## 2015-07-05 MED ORDER — PREGABALIN 50 MG PO CAPS: 50.0000 mg | ORAL_CAPSULE | Freq: Two times a day (BID) | ORAL | Status: DC

## 2015-07-05 NOTE — Progress Notes (Signed)
Right L3-4 Lumbar transforaminal epidural steroid injection under fluoroscopic guidance  Indication: Lumbosacral radiculitis is not relieved by medication management or other conservative care and interfering with self-care and mobility.   Informed consent was obtained after describing risk and benefits of the procedure with the patient, this includes bleeding, bruising, infection, paralysis and medication side effects.  The patient wishes to proceed and has given written consent.  Patient was placed in prone position.  The lumbar area was marked and prepped with Betadine.  It was entered with a 25-gauge 1-1/2 inch needle and one mL of 1% lidocaine was injected into the skin and subcutaneous tissue.  Then a 22-gauge 3.5 spinal needle was inserted into the Right L3-4 intervertebral foramen under AP, lateral, and oblique view.  Then a solution containing one mL of 10 mg per mL dexamethasone and 2 mL of 1% lidocaine was injected.  The patient tolerated procedure well.  Post procedure instructions were given.  Please see post procedure form.

## 2015-07-05 NOTE — Progress Notes (Signed)
  [facility]   Name: ALLIENE KLUGH DOB:[birth year] MRN: [id number]  Date:07/05/2015  Physician: Alysia Penna, MD    Nurse/CMA: Mancel Parsons  Allergies:  Allergies  Allergen Reactions  . Codeine Other (See Comments)    Patient states "she did not like the way codeine made her feel"    . Vicodin [Hydrocodone-Acetaminophen] Nausea And Vomiting    Doesn't like how it makes her feel but can take if needs to for pain    Consent Signed: Yes.    Is patient diabetic? No.  CBG today?   Pregnant: No. LMP: No LMP recorded. Patient is postmenopausal. (age 57-55)  Anticoagulants: no Anti-inflammatory: no Antibiotics: no  Procedure: right transforaminal epidural steroid injection  Position: Prone Start Time: 9:55am  End Time: 10:03am  Fluoro Time: 17  RN/CMA Rolan Bucco Neema Barreira    Time 9:44am 10:08am    BP 144/84 140/87    Pulse 72 67    Respirations 14 14    O2 Sat 100 100    S/S 6 6    Pain Level 7/10 2/10     D/C home with husband, patient A & O X 3, D/C instructions reviewed, and sits independently.

## 2015-07-05 NOTE — Patient Instructions (Signed)
Today we did an injection right L3-L4 epidural to see if we can reduce the anterior thigh pain on the right side.  Next visit we plan on doing lumbar medial branch blocks to address your low back pain on both sides of your low back.

## 2015-08-09 ENCOUNTER — Encounter: Payer: 59 | Attending: Physical Medicine & Rehabilitation

## 2015-08-09 ENCOUNTER — Encounter: Payer: Self-pay | Admitting: Physical Medicine & Rehabilitation

## 2015-08-09 ENCOUNTER — Ambulatory Visit (HOSPITAL_BASED_OUTPATIENT_CLINIC_OR_DEPARTMENT_OTHER): Payer: 59 | Admitting: Physical Medicine & Rehabilitation

## 2015-08-09 VITALS — BP 134/72 | HR 66 | Resp 14

## 2015-08-09 DIAGNOSIS — M47816 Spondylosis without myelopathy or radiculopathy, lumbar region: Secondary | ICD-10-CM

## 2015-08-09 DIAGNOSIS — M7062 Trochanteric bursitis, left hip: Secondary | ICD-10-CM | POA: Diagnosis not present

## 2015-08-09 DIAGNOSIS — M79651 Pain in right thigh: Secondary | ICD-10-CM | POA: Diagnosis present

## 2015-08-09 DIAGNOSIS — Z9889 Other specified postprocedural states: Secondary | ICD-10-CM | POA: Diagnosis not present

## 2015-08-09 DIAGNOSIS — M5136 Other intervertebral disc degeneration, lumbar region: Secondary | ICD-10-CM | POA: Diagnosis not present

## 2015-08-09 NOTE — Progress Notes (Signed)
  [facility]   Name: Whitney Daniel DOB:[birth year] MRN: [id number]  Date:08/09/2015  Physician: Alysia Penna, MD    Nurse/CMA: [name]  Allergies:  Allergies  Allergen Reactions  . Codeine Other (See Comments)    Patient states "she did not like the way codeine made her feel"    . Vicodin [Hydrocodone-Acetaminophen] Nausea And Vomiting    Doesn't like how it makes her feel but can take if needs to for pain    Consent Signed: Yes.    Is patient diabetic? No.  CBG today? N/A  Pregnant: No. LMP: No LMP recorded. Patient is postmenopausal. (age 107-55)  Anticoagulants: no Anti-inflammatory: yes (Naproxen) Antibiotics: no  Procedure:  Bilateral Medial Branch Block L 3-4-5 Position: Prone Start Time: 10:38 am End Time: 10:53 am Fluoro Time: 29  RN/CMA [name]	[name]    Time 10:22 am 10:58 am    BP 134/72 164/86    Pulse 66 67    Respirations 14 14    O2 Sat 99 100    S/S 6 6    Pain Level 6 0     D/C home with husband, patient A & O X 3, D/C instructions reviewed, and sits independently.

## 2015-08-09 NOTE — Patient Instructions (Signed)

## 2015-08-09 NOTE — Progress Notes (Signed)
Bilateral Lumbar L3, L4  medial branch blocks and L 5 dorsal ramus injection under fluoroscopic guidance  Indication: Lumbar pain which is not relieved by medication management or other conservative care and interfering with self-care and mobility.  Informed consent was obtained after describing risks and benefits of the procedure with the patient, this includes bleeding, infection, paralysis and medication side effects.  The patient wishes to proceed and has given written consent.  The patient was placed in prone position.  The lumbar area was marked and prepped with Betadine.  One mL of 1% lidocaine was injected into each of 6 areas into the skin and subcutaneous tissue.  Then a 22-gauge 5 spinal needle was inserted targeting the junction of the left S1 superior articular process and sacral ala junction. Needle was advanced under fluoroscopic guidance.  Bone contact was made.  Omnipaque 180 was injected x 0.5 mL demonstrating no intravascular uptake.  Then a solution containing one mL of 4 mg per mL dexamethasone and 3 mL of 2% MPF lidocaine was injected x 0.5 mL.  Then the left L5 superior articular process in transverse process junction was targeted.  Bone contact was made.  Omnipaque 180 was injected x 0.5 mL demonstrating no intravascular uptake. Then a solution containing one mL of 4 mg per mL dexamethasone and 3 mL of 2% MPF lidocaine was injected x 0.5 mL.  Then the left L4 superior articular process in transverse process junction was targeted.  Bone contact was made.  Omnipaque 180 was injected x 0.5 mL demonstrating no intravascular uptake.  Then a solution containing one mL of 4 mg per mL dexamethasone and 3 mL if 2% MPF lidocaine was injected x 0.5 mL.  This same procedure was performed on the right side using the same needle, technique and injectate.  Patient tolerated procedure well.  Post procedure instructions were given. 

## 2015-09-09 ENCOUNTER — Encounter: Payer: 59 | Attending: Physical Medicine & Rehabilitation

## 2015-09-09 ENCOUNTER — Ambulatory Visit: Payer: 59 | Admitting: Physical Medicine & Rehabilitation

## 2015-09-09 DIAGNOSIS — M5136 Other intervertebral disc degeneration, lumbar region: Secondary | ICD-10-CM | POA: Insufficient documentation

## 2015-09-09 DIAGNOSIS — M79651 Pain in right thigh: Secondary | ICD-10-CM | POA: Insufficient documentation

## 2015-09-09 DIAGNOSIS — Z9889 Other specified postprocedural states: Secondary | ICD-10-CM | POA: Insufficient documentation

## 2015-09-09 DIAGNOSIS — M7062 Trochanteric bursitis, left hip: Secondary | ICD-10-CM | POA: Insufficient documentation

## 2015-09-13 ENCOUNTER — Ambulatory Visit: Payer: 59 | Admitting: Physical Medicine & Rehabilitation

## 2015-09-14 ENCOUNTER — Telehealth: Payer: Self-pay | Admitting: Neurology

## 2015-09-14 NOTE — Telephone Encounter (Signed)
Returned call and answered all question to the best of my ability.

## 2015-09-14 NOTE — Telephone Encounter (Signed)
VM-Dawn with [employer] called in regards to PT and would like a call back at [phone] OPT 1,OPT 2, OPT 4 Ref# [id number]

## 2015-09-17 ENCOUNTER — Ambulatory Visit: Payer: 59 | Admitting: Physical Medicine & Rehabilitation

## 2015-09-21 ENCOUNTER — Ambulatory Visit (HOSPITAL_BASED_OUTPATIENT_CLINIC_OR_DEPARTMENT_OTHER): Payer: 59 | Admitting: Physical Medicine & Rehabilitation

## 2015-09-21 ENCOUNTER — Encounter: Payer: Self-pay | Admitting: Physical Medicine & Rehabilitation

## 2015-09-21 VITALS — BP 138/85 | HR 71 | Resp 14

## 2015-09-21 DIAGNOSIS — S7401XS Injury of sciatic nerve at hip and thigh level, right leg, sequela: Secondary | ICD-10-CM

## 2015-09-21 DIAGNOSIS — M7062 Trochanteric bursitis, left hip: Secondary | ICD-10-CM | POA: Diagnosis not present

## 2015-09-21 DIAGNOSIS — M79651 Pain in right thigh: Secondary | ICD-10-CM | POA: Diagnosis not present

## 2015-09-21 DIAGNOSIS — M5136 Other intervertebral disc degeneration, lumbar region: Secondary | ICD-10-CM | POA: Diagnosis not present

## 2015-09-21 DIAGNOSIS — Z9889 Other specified postprocedural states: Secondary | ICD-10-CM | POA: Diagnosis not present

## 2015-09-21 MED ORDER — PREGABALIN 75 MG PO CAPS: 75.0000 mg | ORAL_CAPSULE | Freq: Two times a day (BID) | ORAL | Status: DC

## 2015-09-21 MED ORDER — TRAMADOL HCL 50 MG PO TABS: 50.0000 mg | ORAL_TABLET | Freq: Three times a day (TID) | ORAL | Status: DC

## 2015-09-21 MED ORDER — DIAZEPAM 10 MG PO TABS: 10.0000 mg | ORAL_TABLET | Freq: Four times a day (QID) | ORAL | Status: DC | PRN

## 2015-09-21 NOTE — Progress Notes (Signed)
Subjective:    Patient ID: Whitney Daniel, female    DOB: [birth year], 58 y.o.   MRN: [id number]  HPI Chief complaint of right posterior thigh pain. Has tried Lyrica as well as tramadol for this.  She feels like the Lyrica and tramadol settle her down make her less anxious. She hasn't had any intolerable side effects. Her dose on the Lyrica is currently 50 mg twice a day and the dose on the tramadol is 50 mg twice a day as well, Pain is still a major issue and alters her ambulation As well as sitting  He has not tried higher doses.  She's had no significant improvements in her right lower extremity pain after L5 and S1 transforaminal epidural injections as well as L3-L4 transforaminal injections.  Patient also complains of low back pain. This increases with Bending forward She did have 100% relief at least for a couple hours, anesthetic phase, after bilateral L3 L4 L5 medial branch blocks  Pain Inventory Average Pain 8 Pain Right Now 7 My pain is constant and burning  In the last 24 hours, has pain interfered with the following? General activity 0 Relation with others 0 Enjoyment of life 0 What TIME of day is your pain at its worst? all Sleep (in general) Fair  Pain is worse with: bending, sitting and standing Pain improves with: rest Relief from Meds: no selection  Mobility walk with assistance use a cane  Function disabled: date disabled . I need assistance with the following:  household duties  Neuro/Psych bowel control problems weakness numbness tingling trouble walking dizziness confusion depression anxiety  Prior Studies Any changes since last visit?  no  Physicians involved in your care Any changes since last visit?  no   Family History  Problem Relation Age of Onset  . Hypertension Mother   . CVA Mother   . Cancer Father   . Diabetes Sister   . Diabetes Father   . Heart attack Sister    Social History   Social History  . Marital Status:  Legally Separated    Spouse Name: N/A  . Number of Children: N/A  . Years of Education: N/A   Social History Main Topics  . Smoking status: Never Smoker   . Smokeless tobacco: Never Used  . Alcohol Use: 0.0 oz/week    0 Standard drinks or equivalent per week     Comment: social - wine  . Drug Use: No  . Sexual Activity: Yes    Birth Control/ Protection: Post-menopausal   Other Topics Concern  . None   Social History Narrative   Lives alone in a one story home.  Has 5 children.  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    Past Surgical History  Procedure Laterality Date  . Wisdom tooth extraction    . Dilation and curettage of uterus      polyps  . Colonoscopy    . Right thigh surgery      cancer  . Dilatation & currettage/hysteroscopy with resectocope N/A 07/15/2014    Procedure: [place];  Surgeon: Marvene Staff, MD;  Location: [facility];  Service: Gynecology;  Laterality: N/A;   Past Medical History  Diagnosis Date  . Hypertension   . Cancer ([place])   . SVD (spontaneous vaginal delivery)     x 5  . Hypothyroidism   . Osteoarthritis   .  Anemia    BP 138/85 mmHg  Pulse 71  Resp 14  SpO2 99%  Opioid Risk Score:   Fall Risk Score:  `1  Depression screen PHQ 2/9  Depression screen [facility] 2/9 06/04/2015 04/27/2015  Decreased Interest 3 3  Down, Depressed, Hopeless 1 1  PHQ - 2 Score 4 4  Altered sleeping - 3  Tired, decreased energy - 3  Change in appetite - 2  Feeling bad or failure about yourself  - 1  Trouble concentrating - 1  Moving slowly or fidgety/restless - 3  Suicidal thoughts - 2  PHQ-9 Score - 19  Difficult doing work/chores - Extremely dIfficult    Review of Systems  Respiratory: Positive for shortness of breath.   Gastrointestinal: Positive for nausea.  Skin: Positive for rash.  All other systems  reviewed and are negative.      Objective:   Physical Exam  Constitutional: She is oriented to person, place, and time. She appears well-developed and well-nourished.  HENT:  Head: Normocephalic and atraumatic.  Eyes: Conjunctivae and EOM are normal. Pupils are equal, round, and reactive to light.  Neck: Normal range of motion.  Neurological: She is alert and oriented to person, place, and time. She has normal strength. No sensory deficit. Coordination normal.  Reflex Scores:      Patellar reflexes are 0 on the right side and 0 on the left side.      Achilles reflexes are 1+ on the right side and 1+ on the left side. 5/5 bilateral hip flexor knee extensor ankle dorsiflexor and plantar flexor as well as hamstrings  Sensation intact to light touch  And pinprick bilateral L3-L4 L5-S1 dermatomal distribution  Intact vibratory sense bilateral great toes  Poor relaxation with attempted deep tendon reflexes testing of the knees  Psychiatric: She has a normal mood and affect.  Nursing note and vitals reviewed.         Assessment & Plan:  1. History of right sciatic tumor status post debridement has had chronic pain in the posterior thigh. No evidence of sciatic neuropathy on EMG testing which was performed a couple months ago by neurology. We discussed treatment options including medication management she would like to start out with this and try to increase her medications first which I think is reasonable given her low doses thus far. We discussed that if the medication management does not help we could consider making a referral to [facility] for a peripheral nerve stimulator  2. Chronic low back pain with 100% relief following lumbar medial branch blocks, would recommend repeat with longer acting anesthetic, we discussed radiofrequency ablation as well

## 2015-09-21 NOTE — Patient Instructions (Signed)
Take one Valium prior to procedure  We increased tramadol to 50 mg 3 times a day  We increased Lyrica to 75 mg twice a day  We will repeat the injection for back pain.  We will hold off on a referral to [facility] for a peripheral nerve stimulator until we see how these other changes will benefit you.

## 2015-10-25 ENCOUNTER — Ambulatory Visit: Payer: 59 | Admitting: Physical Medicine & Rehabilitation

## 2015-11-11 ENCOUNTER — Encounter: Payer: Self-pay | Admitting: Physical Medicine & Rehabilitation

## 2015-11-11 ENCOUNTER — Ambulatory Visit (HOSPITAL_BASED_OUTPATIENT_CLINIC_OR_DEPARTMENT_OTHER): Payer: 59 | Admitting: Physical Medicine & Rehabilitation

## 2015-11-11 ENCOUNTER — Encounter: Payer: 59 | Attending: Physical Medicine & Rehabilitation

## 2015-11-11 VITALS — BP 118/62 | HR 88

## 2015-11-11 DIAGNOSIS — M5136 Other intervertebral disc degeneration, lumbar region: Secondary | ICD-10-CM | POA: Insufficient documentation

## 2015-11-11 DIAGNOSIS — Z9889 Other specified postprocedural states: Secondary | ICD-10-CM | POA: Insufficient documentation

## 2015-11-11 DIAGNOSIS — M79651 Pain in right thigh: Secondary | ICD-10-CM | POA: Diagnosis not present

## 2015-11-11 DIAGNOSIS — M47816 Spondylosis without myelopathy or radiculopathy, lumbar region: Secondary | ICD-10-CM | POA: Diagnosis not present

## 2015-11-11 DIAGNOSIS — M7062 Trochanteric bursitis, left hip: Secondary | ICD-10-CM | POA: Insufficient documentation

## 2015-11-11 MED ORDER — TRAMADOL HCL 50 MG PO TABS: 50.0000 mg | ORAL_TABLET | Freq: Three times a day (TID) | ORAL | Status: DC

## 2015-11-11 MED ORDER — PREGABALIN 75 MG PO CAPS: 75.0000 mg | ORAL_CAPSULE | Freq: Two times a day (BID) | ORAL | Status: DC

## 2015-11-11 NOTE — Progress Notes (Signed)
  [facility]   Name: Whitney Daniel DOB:11/08/1955 MRN: [id number]  Date:11/11/2015  Physician: Alysia Penna, MD    Nurse/CMA:Shumaker RN  Allergies:  Allergies  Allergen Reactions  . Codeine Other (See Comments)    Patient states "she did not like the way codeine made her feel"    . Vicodin [Hydrocodone-Acetaminophen] Nausea And Vomiting    Doesn't like how it makes her feel but can take if needs to for pain    Consent Signed: Yes.    Is patient diabetic? No.  CBG today?   Pregnant: No. LMP: No LMP recorded. Patient is postmenopausal. (age 104-55)  Anticoagulants: no Anti-inflammatory: no Antibiotics: no   Took Valium 20 mg prior to procedure  Procedure: Bilateral Medial Branch Blocks L 3-4-5 Position: Prone Start Time: 10:47 End Time: 10:58 Fluoro Time: 39 sec  RN/CMA Biomedical engineer    Time 10:15 11:03    BP 118/62 137/68    Pulse 88 73    Respirations 14 14    O2 Sat 99 99    S/S 6 6    Pain Level 7/10 0/10     D/C home with husband, patient A & O X 3, D/C instructions reviewed, and sits independently.

## 2015-11-11 NOTE — Patient Instructions (Signed)

## 2015-11-11 NOTE — Progress Notes (Signed)
Bilateral Lumbar L3, L4  medial branch blocks and L 5 dorsal ramus injection under fluoroscopic guidance  Indication: Lumbar pain which is not relieved by medication management or other conservative care and interfering with self-care and mobility.  Informed consent was obtained after describing risks and benefits of the procedure with the patient, this includes bleeding, infection, paralysis and medication side effects.  The patient wishes to proceed and has given written consent.  The patient was placed in prone position.  The lumbar area was marked and prepped with Betadine.  One mL of 1% lidocaine was injected into each of 6 areas into the skin and subcutaneous tissue.  Then a 22-gauge 5" spinal needle was inserted targeting the junction of the left S1 superior articular process and sacral ala junction. Needle was advanced under fluoroscopic guidance.  Bone contact was made.  Omnipaque 180 was injected x 0.5 mL demonstrating no intravascular uptake.  Then a solution containing one mL of 4 mg per mL dexamethasone and 3 mL of 2% MPF lidocaine was injected x 0.5 mL.  Then the left L5 superior articular process in transverse process junction was targeted.  Bone contact was made.  Omnipaque 180 was injected x 0.5 mL demonstrating no intravascular uptake. Then a solution containing one mL of 4 mg per mL dexamethasone and 3 mL of 2% MPF lidocaine was injected x 0.5 mL.  Then the left L4 superior articular process in transverse process junction was targeted.  Bone contact was made.  Omnipaque 180 was injected x 0.5 mL demonstrating no intravascular uptake.  Then a solution containing one mL of 4 mg per mL dexamethasone and 3 mL if 2% MPF lidocaine was injected x 0.5 mL.  This same procedure was performed on the right side using the same needle, technique and injectate.  Patient tolerated procedure well.  Post procedure instructions were given.  Preinjection pain 7/10 Postinjection pain 0/10

## 2015-11-17 ENCOUNTER — Other Ambulatory Visit (HOSPITAL_COMMUNITY): Payer: Self-pay | Admitting: Physician Assistant

## 2015-11-17 DIAGNOSIS — B182 Chronic viral hepatitis C: Secondary | ICD-10-CM

## 2015-11-23 ENCOUNTER — Telehealth: Payer: Self-pay | Admitting: *Deleted

## 2015-11-23 NOTE — Telephone Encounter (Signed)
Zalma calling asking if we can call in tramadol and lyrica.  When I called to clarify since she got RX she has been unable to find. I told her since they were controlled medications we need her to try and find Rx first.She will look again at home and if she cannot find them she will call us back.

## 2015-11-25 ENCOUNTER — Ambulatory Visit (HOSPITAL_COMMUNITY)
Admission: RE | Admit: 2015-11-25 | Discharge: 2015-11-25 | Disposition: A | Discharge status: Home / Self Care | Payer: 59 | Source: Ambulatory Visit | Attending: Physician Assistant | Admitting: Physician Assistant

## 2015-11-25 DIAGNOSIS — B182 Chronic viral hepatitis C: Secondary | ICD-10-CM | POA: Diagnosis not present

## 2015-11-25 DIAGNOSIS — N281 Cyst of kidney, acquired: Secondary | ICD-10-CM | POA: Diagnosis not present

## 2015-12-09 ENCOUNTER — Encounter: Payer: 59 | Attending: Physical Medicine & Rehabilitation

## 2015-12-09 ENCOUNTER — Ambulatory Visit (HOSPITAL_BASED_OUTPATIENT_CLINIC_OR_DEPARTMENT_OTHER): Payer: 59 | Admitting: Physical Medicine & Rehabilitation

## 2015-12-09 ENCOUNTER — Encounter: Payer: Self-pay | Admitting: Physical Medicine & Rehabilitation

## 2015-12-09 VITALS — BP 140/90 | HR 67 | Resp 14

## 2015-12-09 DIAGNOSIS — M5136 Other intervertebral disc degeneration, lumbar region: Secondary | ICD-10-CM | POA: Diagnosis not present

## 2015-12-09 DIAGNOSIS — Z9889 Other specified postprocedural states: Secondary | ICD-10-CM | POA: Diagnosis not present

## 2015-12-09 DIAGNOSIS — M7062 Trochanteric bursitis, left hip: Secondary | ICD-10-CM | POA: Diagnosis not present

## 2015-12-09 DIAGNOSIS — M47816 Spondylosis without myelopathy or radiculopathy, lumbar region: Secondary | ICD-10-CM | POA: Diagnosis not present

## 2015-12-09 DIAGNOSIS — S7401XS Injury of sciatic nerve at hip and thigh level, right leg, sequela: Secondary | ICD-10-CM | POA: Diagnosis not present

## 2015-12-09 DIAGNOSIS — M79651 Pain in right thigh: Secondary | ICD-10-CM | POA: Diagnosis not present

## 2015-12-09 MED ORDER — DIAZEPAM 10 MG PO TABS: 10.0000 mg | ORAL_TABLET | Freq: Four times a day (QID) | ORAL | Status: DC | PRN

## 2015-12-09 NOTE — Progress Notes (Signed)
Subjective:    Patient ID: Whitney Daniel, female    DOB: 31-Mar-1956, 60 y.o.   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 She had left lower extremity chronic paresthesias that had been managed with nortriptyline and gabapentin for a couple months in 2014 but the patient started developing Side effects to these medications and these were discontinued Patient had additional symptoms of tingling in the left scalp arm and leg as well as the right thigh. She's had additional workup including MRI of the right posterior thigh at [facility] which is where she had her surgery. This showed no recurrence. She has had Brain MRI/MRA ordered by neurologist in [place] which showed no abnormalities. Also EMG/NCV of upper and lower extremities revealed no evidence of peripheral neuropathy, radiculopathy or other peripheral nerve findings. Patient has gone through physical therapy mainly for neck pain As well as left hip pain. Her left hip pain has started after she has been sitting basically on the left hip to unweight the right hip and right posterior thigh..   In addition patient had MRI of the lumbar spine dated 01/15/2015 which showed disc bulging at L4-5, L5-S1 but no nerve root compression HPI Patient has been evaluated by neurology , She was told there has been no physical explanation for her lower extremity numbness and tingling  We reviewed her response to medial branch blocks bilateral L3-L4 and bilateral L5 dorsal ramus injections which were performed on 2 occasions,08/09/2015 and 11/11/2015 resulting in 100% pain relief for several hours. This helped with her low back pain.      Pain Inventory Average Pain 7 Pain Right Now 8 My pain is constant, sharp, burning, tingling and aching  In the last 24 hours, has pain interfered with the following? General activity NA Relation with others NA Enjoyment of  life NA What TIME of day is your pain at its worst? morning, daytime, evening, night Sleep (in general) NA  Pain is worse with: walking, bending, sitting and standing Pain improves with: rest and medication Relief from Meds: NA  Mobility use a cane how many minutes can you walk? 5 min with pain do you drive?  yes Do you have any goals in this area?  no  Function employed # of hrs/week NA disabled: date disabled 10/23/14 I need assistance with the following:  dressing, meal prep, household duties and shopping  Neuro/Psych bowel control problems weakness numbness tremor tingling trouble walking dizziness confusion depression anxiety  Prior Studies Any changes since last visit?  yes  Physicians involved in your care Primary care . Neurologist . Orthopedist .   Family History  Problem Relation Age of Onset  . Hypertension Mother   . CVA Mother   . Cancer Father   . Diabetes Sister   . Diabetes Father   . Heart attack Sister    Social History   Social History  . Marital Status: Legally Separated    Spouse Name: N/A  . Number of Children: N/A  . Years of Education: N/A   Social History Main Topics  . Smoking status: Never Smoker   . Smokeless tobacco: Never Used  . Alcohol Use: 0.0 oz/week    0 Standard drinks or equivalent per week     Comment: social - wine  . Drug Use: No  . Sexual Activity: Yes    Birth Control/ Protection: Post-menopausal   Other Topics Concern  . None  Social History Narrative   Lives alone in a one story home.  Has 5 children.  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    Past Surgical History  Procedure Laterality Date  . Wisdom tooth extraction    . Dilation and curettage of uterus      polyps  . Colonoscopy    . Right thigh surgery      cancer  . Dilatation & currettage/hysteroscopy with resectocope N/A 07/15/2014     Procedure: [place];  Surgeon: Marvene Staff, MD;  Location: [facility];  Service: Gynecology;  Laterality: N/A;   Past Medical History  Diagnosis Date  . Hypertension   . Cancer ([place])   . SVD (spontaneous vaginal delivery)     x 5  . Hypothyroidism   . Osteoarthritis   . Anemia    BP 140/90 mmHg  Pulse 67  Resp 14  SpO2 99%  Opioid Risk Score:   Fall Risk Score:  `1  Depression screen PHQ 2/9  Depression screen [facility] 2/9 06/04/2015 04/27/2015  Decreased Interest 3 3  Down, Depressed, Hopeless 1 1  PHQ - 2 Score 4 4  Altered sleeping - 3  Tired, decreased energy - 3  Change in appetite - 2  Feeling bad or failure about yourself  - 1  Trouble concentrating - 1  Moving slowly or fidgety/restless - 3  Suicidal thoughts - 2  PHQ-9 Score - 19  Difficult doing work/chores - Extremely dIfficult     Review of Systems  Constitutional: Positive for appetite change.       Bowel control problems  Respiratory: Positive for cough.        Respiratory infections   Cardiovascular:       Limb swelling   Gastrointestinal: Positive for nausea, diarrhea and constipation.  Musculoskeletal: Positive for gait problem.  Neurological: Positive for dizziness, tremors, weakness and numbness.       Tingling   Psychiatric/Behavioral: Positive for confusion and dysphoric mood. The patient is nervous/anxious.   All other systems reviewed and are negative.      Objective:   Physical Exam  Constitutional: She is oriented to person, place, and time. She appears well-developed and well-nourished.  HENT:  Head: Normocephalic and atraumatic.  Eyes: Conjunctivae and EOM are normal. Pupils are equal, round, and reactive to light.  Neck: Normal range of motion.  Musculoskeletal: Normal range of motion.  No evidence of lumbar or thoracic spine deformity  Neurological: She is alert and oriented to person, place, and time.  Psychiatric: She has a normal  mood and affect.  Nursing note and vitals reviewed.   Patient has normal lumbar flexion and 100% range of motion. She is 100% lateral bending and extension however with extension she has her usual low back pain on both sides. She has no tenderness palpation lumbar paraspinal muscles. No tenderness over the hips. Negative straight leg raising Gait is without evidence of toe drag or knee instability.      Assessment & Plan:  1. Lumbar axial pain which has been relieved on 2 occasions by lumbar medial branch blocks L3-L4 as well as L5 dorsal ramus injection. She has extension that exacerbates her pain. She has no objective radicular symptomatology. Would recommend lumbar radiofrequency at corresponding levels will start with right L3-L4 medial branch and right L5 dorsal ramus radiofrequency ablation in 2 weeks followed by the  left one month later. We discussed postprocedure soreness as well as the possibility it may not give her long-lasting relief. Discussed with both patient and her husband. They agree with plan. Will schedule.  2. 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

## 2015-12-09 NOTE — Patient Instructions (Signed)
Take valium prior to procedure

## 2015-12-28 DIAGNOSIS — M7061 Trochanteric bursitis, right hip: Secondary | ICD-10-CM | POA: Diagnosis not present

## 2015-12-28 DIAGNOSIS — M76892 Other specified enthesopathies of left lower limb, excluding foot: Secondary | ICD-10-CM | POA: Diagnosis not present

## 2015-12-28 DIAGNOSIS — M76891 Other specified enthesopathies of right lower limb, excluding foot: Secondary | ICD-10-CM | POA: Diagnosis not present

## 2015-12-28 DIAGNOSIS — M7062 Trochanteric bursitis, left hip: Secondary | ICD-10-CM | POA: Diagnosis not present

## 2016-01-06 ENCOUNTER — Encounter: Payer: Self-pay | Admitting: Physical Medicine & Rehabilitation

## 2016-01-06 ENCOUNTER — Encounter: Payer: BLUE CROSS/BLUE SHIELD | Attending: Physical Medicine & Rehabilitation

## 2016-01-06 ENCOUNTER — Ambulatory Visit (HOSPITAL_BASED_OUTPATIENT_CLINIC_OR_DEPARTMENT_OTHER): Payer: 59 | Admitting: Physical Medicine & Rehabilitation

## 2016-01-06 VITALS — BP 150/82 | HR 73 | Resp 15

## 2016-01-06 DIAGNOSIS — M7062 Trochanteric bursitis, left hip: Secondary | ICD-10-CM | POA: Insufficient documentation

## 2016-01-06 DIAGNOSIS — Z9889 Other specified postprocedural states: Secondary | ICD-10-CM | POA: Insufficient documentation

## 2016-01-06 DIAGNOSIS — M47816 Spondylosis without myelopathy or radiculopathy, lumbar region: Secondary | ICD-10-CM

## 2016-01-06 DIAGNOSIS — M79651 Pain in right thigh: Secondary | ICD-10-CM | POA: Insufficient documentation

## 2016-01-06 DIAGNOSIS — M5136 Other intervertebral disc degeneration, lumbar region: Secondary | ICD-10-CM | POA: Diagnosis not present

## 2016-01-06 MED ORDER — PREGABALIN 75 MG PO CAPS: 75.0000 mg | ORAL_CAPSULE | Freq: Two times a day (BID) | ORAL | Status: DC

## 2016-01-06 MED ORDER — DIAZEPAM 10 MG PO TABS: 10.0000 mg | ORAL_TABLET | Freq: Four times a day (QID) | ORAL | Status: DC | PRN

## 2016-01-06 MED ORDER — TRAMADOL HCL 50 MG PO TABS: 50.0000 mg | ORAL_TABLET | Freq: Three times a day (TID) | ORAL | Status: DC

## 2016-01-06 NOTE — Patient Instructions (Signed)
You had a radio frequency procedure today This was done to alleviate joint pain in your lumbar area We injected a combination of dexamethasone which is a steroid as well as lidocaine which is a local anesthetic. Dexamethasone made increased blood sugars you are diabetic You may experience soreness at the injection sites. You may also experienced some irritation of the nerves that were heated I'm recommending ice for 30 minutes every 2 hours as needed for the next 24-48 hours   

## 2016-01-06 NOTE — Progress Notes (Signed)
RightL5 dorsal ramus., Right L4 and Right L3 medial branch radio frequency neurotomy under fluoroscopic guidance   Indication: Low back pain due to lumbar spondylosis which has been relieved on 2 occasions by greater than 50% by lumbar medial branch blocks at corresponding levels.  Informed consent was obtained after describing risks and benefits of the procedure with the patient, this includes bleeding, bruising, infection, paralysis and medication side effects. The patient wishes to proceed and has given written consent. The patient was placed in a prone position. The lumbar and sacral area was marked and prepped with Betadine. A 25-gauge 1-1/2 inch needle was inserted into the skin and subcutaneous tissue at 3 sites in one ML of 1% lidocaine was injected into each site. Then a 20-gauge 15 cm radio frequency needle with a 1 cm curved active tip was inserted targeting the Right S1 SAP/sacral ala junction. Bone contact was made and confirmed with lateral imaging. Sensory stimulation at 50 Hz followed by motor stimulation at 2 Hz confirm proper needle location followed by injection of one ML of the solution containing one ML of 4 mg per mL dexamethasone and 3 mL of 1% MPF lidocaine. Then the Right L5 SAP/transverse process junction was targeted. Bone contact was made and confirmed with lateral imaging. Sensory stimulation at 50 Hz followed by motor stimulation at 2 Hz confirm proper needle location followed by injection of one ML of the solution containing one ML of 4 mg per mL dexamethasone and 3 mL of 1% MPF lidocaine. Then the Right L4 SAP/transverse process junction was targeted. Bone contact was made and confirmed with lateral imaging. Sensory stimulation at 50 Hz followed by motor stimulation at 2 Hz confirm proper needle location followed by injection of one ML of the solution containing one ML of 4 mg per mL dexamethasone and 3 mL of 1% MPF lidocaine. Radio frequency lesion being at 80C for 90 seconds  was performed. Needles were removed. Post procedure instructions and vital signs were performed. Patient tolerated procedure well. Followup appointment was given. 

## 2016-01-06 NOTE — Progress Notes (Signed)
  [facility]   Name: Whitney Daniel DOB:Apr 10, 1956 MRN: [id number]  Date:01/06/2016  Physician: Alysia Penna, MD    Nurse/CMA: [name], CMA  Allergies:  Allergies  Allergen Reactions  . Codeine Other (See Comments)    Patient states "she did not like the way codeine made her feel"    . Vicodin [Hydrocodone-Acetaminophen] Nausea And Vomiting    Doesn't like how it makes her feel but can take if needs to for pain    Consent Signed: Yes.    Is patient diabetic? No.  CBG today?NA  Pregnant: No. LMP: No LMP recorded. Patient is postmenopausal. (age 21-55)  Anticoagulants: no Anti-inflammatory: no Antibiotics: no  Procedure: Right L3-4-5 Radiofrequency Neurotomy Position: Prone Start Time: 1002  End Time: 1023  Fluoro Time: 38  RN/CMA Venetia Prewitt, CMA [employer], CMA    Time 0945 1028    BP 150/72 158/81    Pulse 73 82    Respirations 15 15    O2 Sat 99 99    S/S 6 6    Pain Level 7/10 5/10     D/C home with Whitney Daniel, patient A & O X 3, D/C instructions reviewed, and sits independently.

## 2016-01-14 ENCOUNTER — Telehealth: Payer: Self-pay | Admitting: General Surgery

## 2016-01-14 ENCOUNTER — Encounter: Payer: Self-pay | Admitting: General Surgery

## 2016-01-14 NOTE — Telephone Encounter (Signed)
Spoke with patient re new patient appointment with Dr. Rolanda Jay 01/18/2016 @ 3 pm to arrive 2:30 pm. Patient demographic and insurance information confirmed. Records forwarded to Dr. Rolanda Jay.

## 2016-01-17 DIAGNOSIS — H00022 Hordeolum internum right lower eyelid: Secondary | ICD-10-CM | POA: Diagnosis not present

## 2016-01-18 ENCOUNTER — Encounter: Payer: Self-pay | Admitting: General Surgery

## 2016-01-18 ENCOUNTER — Ambulatory Visit (HOSPITAL_BASED_OUTPATIENT_CLINIC_OR_DEPARTMENT_OTHER): Payer: 59 | Admitting: General Surgery

## 2016-01-18 VITALS — BP 152/88 | HR 68 | Temp 98.6°F | Resp 18 | Ht 68.0 in | Wt 194.8 lb

## 2016-01-18 DIAGNOSIS — R918 Other nonspecific abnormal finding of lung field: Secondary | ICD-10-CM | POA: Diagnosis not present

## 2016-01-18 DIAGNOSIS — C492 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, including hip: Secondary | ICD-10-CM

## 2016-01-18 DIAGNOSIS — Z85831 Personal history of malignant neoplasm of soft tissue: Secondary | ICD-10-CM | POA: Diagnosis not present

## 2016-01-18 DIAGNOSIS — L989 Disorder of the skin and subcutaneous tissue, unspecified: Secondary | ICD-10-CM

## 2016-01-18 DIAGNOSIS — C499 Malignant neoplasm of connective and soft tissue, unspecified: Secondary | ICD-10-CM

## 2016-01-18 NOTE — Patient Instructions (Signed)
We will make a referral to meet with Dr. Dutch Gray at [facility] to discuss/evaluate your bladder/kidneys.  We will also make you a referral to a dermatologist for evaluation of the left lower leg skin area.  Arrive at 9:15am at [facility] on September 14 for chest xray followed by MRI of the right femur/thigh right after around 10am.  Plan to follow up with Dr. Rolanda Jay in the office on September 19 or sooner if any issues, concerns, or symptoms arise.  Please call for any questions or concerns.

## 2016-01-18 NOTE — Progress Notes (Signed)
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   HISTORY OF PRESENTING ILLNESS:  I had the pleasure of meeting with Whitney Daniel who is a very pleasant 60 year old lady whose history dates back to February 2011. At that time she began expressing low back pain and presented to the [facility] emergency department where imaging involved in her evaluation at had the incidental finding of a mass in the right thigh. It was recommended to her that she follow up with her primary care physician. In follow-up she underwent a MRI scan that did confirm a large mass in the right posterior lateral aspect of the thigh. She indicates that she had been completely unaware of a mass in that location until the imaging him brought to her attention. At that time she denied any specific pain, weakness, paresthesias or buckling of the leg. She was referred to [place] where she underwent evaluation and treatment by Dr. Mylo Red. She initially had a core biopsy to confirm malignancy which led to resection in June 2011. She had a relatively unremarkable postoperative recovery and her drain was removed without abnormality. 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 Her primary care physician had recommended that she follow up with [facility] moving forward to their sarcoma clinic. She is followed  up with them until December of 2015. At that time she had developed hematuria prompting an extensive evaluation at [facility] in [place] where they took over the imaging surveillance of her thigh. She has not followed up with [facility] since that time. In follow-up it had been noted that she had 2 small pulmonary nodules that did not appear to be changing over time. On imaging of her thigh there is never been evidence of local recurrence. Her current understanding is that she is to undergo chest imaging studies with MRI scan every 6 months indefinitely.  I reviewed her records extensively and collaborated the history with the patient.  MEDICAL HISTORY:  Past Medical History  Diagnosis Date  . Hypertension   . Cancer ([place])   . SVD (spontaneous vaginal delivery)     x 5  . Hypothyroidism   . Osteoarthritis   . Anemia Hypothyroidism     SURGICAL HISTORY: Past Surgical History  Procedure Laterality Date  . Wisdom tooth extraction    . Dilation and curettage of uterus      polyps  . Colonoscopy    . Right thigh surgery      cancer  . Dilatation & currettage/hysteroscopy with resectocope N/A 07/15/2014    Procedure: [place];  Surgeon: Marvene Staff, MD;  Location: [facility];  Service: Gynecology;  Laterality: N/A;    SOCIAL HISTORY: Social History   Social History  . Marital Status: Legally Separated    Spouse Name: N/A  . Number of Children: N/A  . Years of Education: N/A   Occupational History  .  Not on file.   Social History Main Topics  . Smoking status: Never Smoker   . Smokeless tobacco: Never Used  . Alcohol Use: 0.0 oz/week    0 Standard drinks or equivalent per week     Comment: social - wine  . Drug Use: No  . Sexual Activity: Yes    Birth Control/ Protection: Post-menopausal   Other Topics Concern  . Not on file   Social History Narrative   Lives alone in a one story home.  Has 5  children.  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     FAMILY HISTORY: Family History  Problem Relation Age of Onset  . Hypertension Mother   . CVA Mother   . Cancer Father   . Diabetes Sister   . Diabetes Father   . Heart attack Sister     ALLERGIES:  is allergic to codeine and vicodin.  MEDICATIONS:  Current Outpatient Prescriptions  Medication Sig Dispense Refill  . albuterol (PROVENTIL HFA;VENTOLIN HFA) 108 (90 BASE) MCG/ACT inhaler Inhale 2 puffs into the lungs every 6 (six) hours as needed for wheezing.    . Biotin (RA BIOTIN) 2.5 MG CAPS Take by mouth.    . diazepam (VALIUM) 10 MG tablet Take 1 tablet (10 mg total) by mouth every 6 (six) hours as needed for anxiety. 2 tablet 0  . hydrochlorothiazide (HYDRODIURIL) 25 MG tablet Take 25 mg by mouth daily.    Marland Kitchen levothyroxine (SYNTHROID, LEVOTHROID) 88 MCG tablet Take 88 mcg by mouth daily before breakfast.    . Multiple Vitamin (MULTIVITAMIN WITH MINERALS) TABS Take 1 tablet by mouth daily.    . naproxen (NAPROSYN) 250 MG tablet Take 250 mg by mouth.    . pregabalin (LYRICA) 75 MG capsule Take 1 capsule (75 mg total) by mouth 2 (two) times daily. 60 capsule 1  . traMADol (ULTRAM) 50 MG tablet Take 1 tablet (50 mg total) by mouth 3 (three) times daily. 90 tablet 1  . cholecalciferol (VITAMIN D) 1000 UNITS tablet Take 1,000 Units by mouth daily. Reported on 01/18/2016    . cyclobenzaprine (FLEXERIL) 10 MG tablet Take by mouth. Reported on 01/18/2016    . ondansetron (ZOFRAN ODT) 4 MG disintegrating tablet Take 1 tablet (4 mg total) by mouth every 8 (eight) hours as needed for nausea or vomiting. (Patient not taking: Reported on 01/18/2016) 20 tablet 0   No current facility-administered medications for this visit.    REVIEW OF SYSTEMS:   Constitutional: Denies fevers, chills or abnormal night sweats Eyes: Denies blurriness of vision,  double vision or watery eyes Ears, nose, mouth, throat, and face: Denies mucositis or sore throat Respiratory: Denies cough, dyspnea or wheezes Cardiovascular: Denies palpitation, chest discomfort or lower extremity swelling Gastrointestinal:  Denies nausea, heartburn or change in bowel habits Skin: Denies abnormal skin rashes Lymphatics: Denies new lymphadenopathy or easy bruising Neurological:Denies numbness, tingling or new weaknesses Behavioral/Psych: Mood is stable, no new changes  All other systems were reviewed with the patient and are negative.  PHYSICAL EXAMINATION: ECOG PERFORMANCE STATUS: 0 - Asymptomatic  Filed Vitals:   01/18/16 1438  BP: 152/88  Pulse: 68  Temp: 98.6 F (37 C)  Resp: 18   Filed Weights   01/18/16 1438  Weight: 88.361 kg (194 lb 12.8 oz)   Examination performed with female chaperone in the room. GENERAL:alert, no distress and comfortable SKIN: skin color, texture,  turgor are normal, no rashes or significant lesions EYES: normal, conjunctiva are pink and non-injected, sclera clear OROPHARYNX:no exudate, no erythema and lips, buccal mucosa, and tongue normal  NECK: supple, thyroid normal size, non-tender, without nodularity LYMPH:  no palpable lymphadenopathy in the cervical, axillary or inguinal LUNGS: clear to auscultation and percussion with normal breathing effort HEART: regular rate & rhythm and no murmurs and no lower extremity edema ABDOMEN:abdomen soft, non-tender and normal bowel sounds Musculoskeletal:no cyanosis of digits and no clubbing  PSYCH: alert & oriented x 3 with fluent speech NEURO: no focal motor/sensory deficits MUSCULOSKELETAL: She has 5 over 5 strength throughout all extremities. There is a well-healed 19 cm longitudinally oriented right posterior lateral thigh incision. There is no nodularity or palpable masses to suggest local recurrence. She is neurovascularly intact throughout bilateral lower extremities.    Pathology: Jeanmarie Plant:  02/11/2010 Amended final pathologic diagnosis report of soft tissue, right posterior thigh, resection: Low-grade fibromyxoid sarcoma (hyalinizing spindle cell tumor with giant rosettes). Margins are negative for tumor. In the common section they note: Although this tumor shares many histologic features of fibromatosis, the presence of collagen rosettes is more in keeping with low-grade fibromyxoid sarcoma. While both are low-grade tumors, in contrast to fibromatosis, low-grade fibromyxoid sarcoma carries additional risk for lytic metastases. His was sent for consultation with Dr. Kriste Basque, a soft tissue expert at [facility]. In his report, he agrees with the above diagnosis. He also reports that the tumor stain positively for the EMA and MUC4, in his lab, which further supports the diagnosis of low-grade fibromyxoid sarcoma. He advises careful follow-up.  RADIOGRAPHIC STUDIES: CT scan of chest, abdomen, pelvis dated 08/23/2015. Impression: Stable exam with no changes in the appearance of the posterior upper right thigh musculature or in the 2 tiny lung nodules. Incidental note is made of 2 stable nonobstructing renal calculi. In the body of the report they note a 0.2 cm peripheral nodule in the right middle lobe that is stable and a 0.1-0.2 cm right middle lobe nodule lying peripherally that is also unchanged.  MRI scan of right femur with and without contrast dated 05/24/2015. No discrete evidence of recurrent mass. Well-defined foci of increased signal in the right femoral head and posterior acetabulum/issue him likely represent degenerative change. Recommend continued follow-up.   ASSESSMENT AND PLAN:  Patient with a history of right posterior lateral thigh fibromyxoid soft tissue sarcoma (low-grade). In her surveillance there is been no evidence of local recurrence and there are 2 small pulmonary nodules are being followed expectantly with no  significant change. At a long discussion with the patient was review soft tissue sarcoma natural history incidents and general treatment recommendations. We also reviewed her care to date. I agree with long-term surveillance including chest imaging with MRI of the scan combined with physical examination. We reviewed in CCN guidelines and since she is now approaching over 5 years out from resection. We reviewed CT scan versus plain film radiograph of the chest. Due to the number of CT scans she's received over the year she is comfortable with moving to plain film radiograph to try to decrease the amount of radiation she's been receiving. I think this is very reasonable. We will plan to see her back with a PA and lateral of the chest and a MRI of the right thigh with and without contrast in September of this year which will put her approximate one year since her last MRI scan. She understands and agrees with  the plan as outlined above. She is also requested an opportunity to see a local urologist for review of her workup to date. She is also requested a dermatology referral for routine dermatologic surveillance.   All questions were answered. The patient knows to call the clinic with any problems, questions or concerns.    Frederich Cha, MD 3:59 PM

## 2016-01-19 ENCOUNTER — Other Ambulatory Visit: Payer: Self-pay | Admitting: Gynecologic Oncology

## 2016-01-19 DIAGNOSIS — N329 Bladder disorder, unspecified: Secondary | ICD-10-CM

## 2016-01-20 NOTE — Addendum Note (Signed)
Addended by: Joylene John D on: 01/20/2016 03:39 PM   Modules accepted: Orders

## 2016-01-21 ENCOUNTER — Telehealth: Payer: Self-pay

## 2016-01-21 NOTE — Telephone Encounter (Addendum)
Orders received from Joylene John, Gus Puma to contact [facility] : [phone]  , Fax: [phone] [address] , [place] , [place] ) to place a referral for Skin lesion on left lower extremity with Dr Harriett Sine . Spoke with Stanton Kidney , patient was scheduled for evaluation of Left lower skin lesion , per Dr Sammuel Hines request . Patient was scheduled for March 08, 2016 at 2 PM with Dr Harriett Sine , patient is to arrive at 1:45 PM to register. Patient states understanding , denies further questions or concerns at this time.

## 2016-01-25 ENCOUNTER — Encounter: Payer: Self-pay | Admitting: Gynecologic Oncology

## 2016-01-25 NOTE — Progress Notes (Signed)
Patient has appt with Dr. Alinda Money at [facility] for this Friday, May 26 at 11am.

## 2016-01-27 DIAGNOSIS — H0012 Chalazion right lower eyelid: Secondary | ICD-10-CM | POA: Diagnosis not present

## 2016-01-28 DIAGNOSIS — R310 Gross hematuria: Secondary | ICD-10-CM | POA: Diagnosis not present

## 2016-02-02 DIAGNOSIS — M76891 Other specified enthesopathies of right lower limb, excluding foot: Secondary | ICD-10-CM | POA: Diagnosis not present

## 2016-02-02 DIAGNOSIS — E039 Hypothyroidism, unspecified: Secondary | ICD-10-CM | POA: Diagnosis not present

## 2016-02-02 DIAGNOSIS — I10 Essential (primary) hypertension: Secondary | ICD-10-CM | POA: Diagnosis not present

## 2016-02-02 DIAGNOSIS — M79671 Pain in right foot: Secondary | ICD-10-CM | POA: Diagnosis not present

## 2016-02-02 DIAGNOSIS — B070 Plantar wart: Secondary | ICD-10-CM | POA: Diagnosis not present

## 2016-02-02 DIAGNOSIS — M79672 Pain in left foot: Secondary | ICD-10-CM | POA: Diagnosis not present

## 2016-02-02 DIAGNOSIS — C499 Malignant neoplasm of connective and soft tissue, unspecified: Secondary | ICD-10-CM | POA: Diagnosis not present

## 2016-02-02 DIAGNOSIS — M76892 Other specified enthesopathies of left lower limb, excluding foot: Secondary | ICD-10-CM | POA: Diagnosis not present

## 2016-02-03 ENCOUNTER — Encounter: Payer: BLUE CROSS/BLUE SHIELD | Attending: Physical Medicine & Rehabilitation

## 2016-02-03 ENCOUNTER — Encounter: Payer: Self-pay | Admitting: Physical Medicine & Rehabilitation

## 2016-02-03 ENCOUNTER — Ambulatory Visit (HOSPITAL_BASED_OUTPATIENT_CLINIC_OR_DEPARTMENT_OTHER): Payer: 59 | Admitting: Physical Medicine & Rehabilitation

## 2016-02-03 VITALS — BP 142/93 | HR 73 | Resp 15

## 2016-02-03 DIAGNOSIS — Z9889 Other specified postprocedural states: Secondary | ICD-10-CM | POA: Diagnosis not present

## 2016-02-03 DIAGNOSIS — M533 Sacrococcygeal disorders, not elsewhere classified: Secondary | ICD-10-CM | POA: Diagnosis not present

## 2016-02-03 DIAGNOSIS — M79651 Pain in right thigh: Secondary | ICD-10-CM | POA: Insufficient documentation

## 2016-02-03 DIAGNOSIS — M7062 Trochanteric bursitis, left hip: Secondary | ICD-10-CM | POA: Insufficient documentation

## 2016-02-03 DIAGNOSIS — M5136 Other intervertebral disc degeneration, lumbar region: Secondary | ICD-10-CM | POA: Diagnosis not present

## 2016-02-03 NOTE — Progress Notes (Signed)
  [facility]   Name: Whitney Daniel DOB:[birth year] MRN: [id number]  Date:02/03/2016  Physician: Alysia Penna, MD    Nurse/CMA: [name], CMA  Allergies:  Allergies  Allergen Reactions  . Codeine Other (See Comments)    Patient states "she did not like the way codeine made her feel"    . Vicodin [Hydrocodone-Acetaminophen] Nausea And Vomiting    Doesn't like how it makes her feel but can take if needs to for pain    Consent Signed: Yes.    Is patient diabetic? No.  CBG today? NA  Pregnant: No. LMP: No LMP recorded. Patient is postmenopausal. (age 34-55)  Anticoagulants: no Anti-inflammatory: no Antibiotics: yes (doxycycline for sty)  Procedure: Bilateral Sacroiliac injection Position: Prone Start Time: 1010 End Time: [id number] Fluoro Time: 19  RN/CMA Roshawn Ayala, CMA Charlet Harr, CMA    Time 0940 1026    BP 142/93 135/90    Pulse 73 71    Respirations 15 15    O2 Sat 98 99    S/S 6 6    Pain Level 6/10 4/10     D/C home with [name], patient A & O X 3, D/C instructions reviewed, and sits independently.

## 2016-02-03 NOTE — Progress Notes (Signed)

## 2016-02-03 NOTE — Patient Instructions (Signed)
Sacroiliac injection was performed today. A combination of a naming medicine plus a cortisone medicine was injected. The injection was done under x-ray guidance. This procedure has been performed to help reduce low back and buttocks pain as well as potentially hip pain. The duration of this injection is variable lasting from hours to  Months. It may repeated if needed.  You will need to recover from your hip arthroscopy and then we will see what pain  Has improved.  Other potential treatment options include trying a over-the-counter TENS unit

## 2016-02-07 DIAGNOSIS — R310 Gross hematuria: Secondary | ICD-10-CM | POA: Diagnosis not present

## 2016-02-07 DIAGNOSIS — N200 Calculus of kidney: Secondary | ICD-10-CM | POA: Diagnosis not present

## 2016-02-09 DIAGNOSIS — E039 Hypothyroidism, unspecified: Secondary | ICD-10-CM | POA: Diagnosis not present

## 2016-02-09 DIAGNOSIS — M76892 Other specified enthesopathies of left lower limb, excluding foot: Secondary | ICD-10-CM | POA: Diagnosis not present

## 2016-02-09 DIAGNOSIS — J4520 Mild intermittent asthma, uncomplicated: Secondary | ICD-10-CM | POA: Diagnosis not present

## 2016-02-09 DIAGNOSIS — M25852 Other specified joint disorders, left hip: Secondary | ICD-10-CM | POA: Diagnosis not present

## 2016-02-09 DIAGNOSIS — M7602 Gluteal tendinitis, left hip: Secondary | ICD-10-CM | POA: Diagnosis not present

## 2016-02-09 DIAGNOSIS — M24152 Other articular cartilage disorders, left hip: Secondary | ICD-10-CM | POA: Diagnosis not present

## 2016-02-09 DIAGNOSIS — M7062 Trochanteric bursitis, left hip: Secondary | ICD-10-CM | POA: Diagnosis not present

## 2016-02-09 DIAGNOSIS — I10 Essential (primary) hypertension: Secondary | ICD-10-CM | POA: Diagnosis not present

## 2016-02-09 DIAGNOSIS — M67952 Unspecified disorder of synovium and tendon, left thigh: Secondary | ICD-10-CM | POA: Diagnosis not present

## 2016-02-09 DIAGNOSIS — Z7982 Long term (current) use of aspirin: Secondary | ICD-10-CM | POA: Diagnosis not present

## 2016-02-09 DIAGNOSIS — Z79899 Other long term (current) drug therapy: Secondary | ICD-10-CM | POA: Diagnosis not present

## 2016-02-09 DIAGNOSIS — M67852 Other specified disorders of synovium, left hip: Secondary | ICD-10-CM | POA: Diagnosis not present

## 2016-02-09 DIAGNOSIS — M94252 Chondromalacia, left hip: Secondary | ICD-10-CM | POA: Diagnosis not present

## 2016-02-10 DIAGNOSIS — M67952 Unspecified disorder of synovium and tendon, left thigh: Secondary | ICD-10-CM | POA: Diagnosis not present

## 2016-02-10 DIAGNOSIS — M7062 Trochanteric bursitis, left hip: Secondary | ICD-10-CM | POA: Diagnosis not present

## 2016-02-10 DIAGNOSIS — M24152 Other articular cartilage disorders, left hip: Secondary | ICD-10-CM | POA: Diagnosis not present

## 2016-02-10 DIAGNOSIS — I10 Essential (primary) hypertension: Secondary | ICD-10-CM | POA: Diagnosis not present

## 2016-02-10 DIAGNOSIS — Z7982 Long term (current) use of aspirin: Secondary | ICD-10-CM | POA: Diagnosis not present

## 2016-02-10 DIAGNOSIS — E039 Hypothyroidism, unspecified: Secondary | ICD-10-CM | POA: Diagnosis not present

## 2016-02-10 DIAGNOSIS — Z79899 Other long term (current) drug therapy: Secondary | ICD-10-CM | POA: Diagnosis not present

## 2016-02-10 DIAGNOSIS — M76892 Other specified enthesopathies of left lower limb, excluding foot: Secondary | ICD-10-CM | POA: Diagnosis not present

## 2016-02-10 DIAGNOSIS — J4520 Mild intermittent asthma, uncomplicated: Secondary | ICD-10-CM | POA: Diagnosis not present

## 2016-02-16 DIAGNOSIS — M7072 Other bursitis of hip, left hip: Secondary | ICD-10-CM | POA: Diagnosis not present

## 2016-02-16 DIAGNOSIS — R2681 Unsteadiness on feet: Secondary | ICD-10-CM | POA: Diagnosis not present

## 2016-02-16 DIAGNOSIS — M25552 Pain in left hip: Secondary | ICD-10-CM | POA: Diagnosis not present

## 2016-02-20 DIAGNOSIS — I10 Essential (primary) hypertension: Secondary | ICD-10-CM | POA: Diagnosis not present

## 2016-02-20 DIAGNOSIS — M79605 Pain in left leg: Secondary | ICD-10-CM | POA: Diagnosis not present

## 2016-02-20 DIAGNOSIS — M25552 Pain in left hip: Secondary | ICD-10-CM | POA: Diagnosis not present

## 2016-02-20 DIAGNOSIS — S8012XA Contusion of left lower leg, initial encounter: Secondary | ICD-10-CM | POA: Diagnosis not present

## 2016-02-20 DIAGNOSIS — Z885 Allergy status to narcotic agent status: Secondary | ICD-10-CM | POA: Diagnosis not present

## 2016-02-20 DIAGNOSIS — E039 Hypothyroidism, unspecified: Secondary | ICD-10-CM | POA: Diagnosis not present

## 2016-02-20 DIAGNOSIS — Z79899 Other long term (current) drug therapy: Secondary | ICD-10-CM | POA: Diagnosis not present

## 2016-02-22 DIAGNOSIS — M7072 Other bursitis of hip, left hip: Secondary | ICD-10-CM | POA: Diagnosis not present

## 2016-02-22 DIAGNOSIS — R2681 Unsteadiness on feet: Secondary | ICD-10-CM | POA: Diagnosis not present

## 2016-02-22 DIAGNOSIS — M25552 Pain in left hip: Secondary | ICD-10-CM | POA: Diagnosis not present

## 2016-03-01 DIAGNOSIS — M25552 Pain in left hip: Secondary | ICD-10-CM | POA: Diagnosis not present

## 2016-03-01 DIAGNOSIS — M7072 Other bursitis of hip, left hip: Secondary | ICD-10-CM | POA: Diagnosis not present

## 2016-03-01 DIAGNOSIS — R2681 Unsteadiness on feet: Secondary | ICD-10-CM | POA: Diagnosis not present

## 2016-03-08 DIAGNOSIS — M25552 Pain in left hip: Secondary | ICD-10-CM | POA: Diagnosis not present

## 2016-03-08 DIAGNOSIS — M7072 Other bursitis of hip, left hip: Secondary | ICD-10-CM | POA: Diagnosis not present

## 2016-03-08 DIAGNOSIS — R2681 Unsteadiness on feet: Secondary | ICD-10-CM | POA: Diagnosis not present

## 2016-03-16 DIAGNOSIS — R2681 Unsteadiness on feet: Secondary | ICD-10-CM | POA: Diagnosis not present

## 2016-03-16 DIAGNOSIS — M7072 Other bursitis of hip, left hip: Secondary | ICD-10-CM | POA: Diagnosis not present

## 2016-03-16 DIAGNOSIS — M25552 Pain in left hip: Secondary | ICD-10-CM | POA: Diagnosis not present

## 2016-03-23 DIAGNOSIS — R2681 Unsteadiness on feet: Secondary | ICD-10-CM | POA: Diagnosis not present

## 2016-03-23 DIAGNOSIS — M25552 Pain in left hip: Secondary | ICD-10-CM | POA: Diagnosis not present

## 2016-03-23 DIAGNOSIS — M7072 Other bursitis of hip, left hip: Secondary | ICD-10-CM | POA: Diagnosis not present

## 2016-03-30 DIAGNOSIS — R2681 Unsteadiness on feet: Secondary | ICD-10-CM | POA: Diagnosis not present

## 2016-03-30 DIAGNOSIS — M7072 Other bursitis of hip, left hip: Secondary | ICD-10-CM | POA: Diagnosis not present

## 2016-03-30 DIAGNOSIS — M25552 Pain in left hip: Secondary | ICD-10-CM | POA: Diagnosis not present

## 2016-03-31 ENCOUNTER — Ambulatory Visit (HOSPITAL_BASED_OUTPATIENT_CLINIC_OR_DEPARTMENT_OTHER): Payer: 59 | Admitting: Physical Medicine & Rehabilitation

## 2016-03-31 ENCOUNTER — Encounter: Payer: BLUE CROSS/BLUE SHIELD | Attending: Physical Medicine & Rehabilitation

## 2016-03-31 ENCOUNTER — Encounter: Payer: Self-pay | Admitting: Physical Medicine & Rehabilitation

## 2016-03-31 VITALS — BP 127/86 | HR 68 | Resp 17

## 2016-03-31 DIAGNOSIS — S7401XS Injury of sciatic nerve at hip and thigh level, right leg, sequela: Secondary | ICD-10-CM

## 2016-03-31 DIAGNOSIS — M7062 Trochanteric bursitis, left hip: Secondary | ICD-10-CM | POA: Diagnosis not present

## 2016-03-31 DIAGNOSIS — M79651 Pain in right thigh: Secondary | ICD-10-CM | POA: Diagnosis not present

## 2016-03-31 DIAGNOSIS — M47816 Spondylosis without myelopathy or radiculopathy, lumbar region: Secondary | ICD-10-CM

## 2016-03-31 DIAGNOSIS — Z9889 Other specified postprocedural states: Secondary | ICD-10-CM | POA: Insufficient documentation

## 2016-03-31 DIAGNOSIS — M5136 Other intervertebral disc degeneration, lumbar region: Secondary | ICD-10-CM | POA: Insufficient documentation

## 2016-03-31 MED ORDER — PREGABALIN 75 MG PO CAPS: 75.0000 mg | ORAL_CAPSULE | Freq: Two times a day (BID) | ORAL | 1 refills | Status: DC

## 2016-03-31 MED ORDER — TRAMADOL HCL 50 MG PO TABS: 50.0000 mg | ORAL_TABLET | Freq: Three times a day (TID) | ORAL | 1 refills | Status: DC

## 2016-03-31 NOTE — Progress Notes (Signed)
Subjective:     Patient ID: Whitney Daniel, female   DOB: 18-Dec-1955, 60 y.o.   MRN: [id number]  HPI Left sided low back pain improved after Sacroiliac in jection not as much relief on the right Prior Right L3,4,5 RFA only with temporary relief Had prior left trochanteric bursectomy June 2017, improved pain postoperatively. Planning to have the right trochanteric bursectomy performed in September 2017.  Attending PT twice a week Pain Inventory Average Pain 7 Pain Right Now NA My pain is constant and tingling  In the last 24 hours, has pain interfered with the following? General activity 5 Relation with others 4 Enjoyment of life 6 What TIME of day is your pain at its worst? constant Sleep (in general) NA  Pain is worse with: walking, bending and standing Pain improves with: rest Relief from Meds: NA  Mobility use a cane Do you have any goals in this area?  yes  Function disabled: date disabled 10/23/14 I need assistance with the following:  household duties and shopping  Neuro/Psych bowel control problems weakness tremor tingling trouble walking depression anxiety  Prior Studies Any changes since last visit?  yes  Physicians involved in your care Any changes since last visit?  no   Family History  Problem Relation Age of Onset  . Hypertension Mother   . CVA Mother   . Cancer Father   . Diabetes Father   . Diabetes Sister   . Heart attack Sister    Social History   Social History  . Marital status: Legally Separated    Spouse name: N/A  . Number of children: N/A  . Years of education: N/A   Social History Main Topics  . Smoking status: Never Smoker  . Smokeless tobacco: Never Used  . Alcohol use 0.0 oz/week     Comment: social - wine  . Drug use: No  . Sexual activity: Yes    Birth control/ protection: Post-menopausal   Other Topics Concern  . None   Social History Narrative   Lives alone in a one story home.  Has 5 children.  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    Past Surgical History:  Procedure Laterality Date  . COLONOSCOPY    . DILATATION & CURRETTAGE/HYSTEROSCOPY WITH RESECTOCOPE N/A 07/15/2014   Procedure: DILATATION & CURETTAGE/HYSTEROSCOPY WITH RESECTOCOPE;  Surgeon: Marvene Staff, MD;  Location: [facility];  Service: Gynecology;  Laterality: N/A;  . DILATION AND CURETTAGE OF UTERUS     polyps  . right thigh surgery     cancer  . WISDOM TOOTH EXTRACTION     Past Medical History:  Diagnosis Date  . Anemia   . Cancer ([place])   . Hypertension   . Hypothyroidism   . Osteoarthritis   . SVD (spontaneous vaginal delivery)    x 5   BP 127/86   Pulse 68   Resp 17   SpO2 99%   Opioid Risk Score:   Fall Risk Score:  `1  Depression screen PHQ 2/9  Depression screen [facility] 2/9 06/04/2015 04/27/2015  Decreased Interest 3 3  Down, Depressed, Hopeless 1 1  PHQ - 2 Score 4 4  Altered sleeping - 3  Tired, decreased energy - 3  Change in appetite - 2  Feeling bad or failure about yourself  - 1  Trouble concentrating - 1  Moving slowly or fidgety/restless - 3  Suicidal  thoughts - 2  PHQ-9 Score - 19  Difficult doing work/chores - Extremely dIfficult    Review of Systems  Constitutional:       Bowel control problems  Musculoskeletal: Positive for gait problem.  Neurological: Positive for tremors and weakness.       Tingling   Psychiatric/Behavioral: Positive for dysphoric mood. The patient is nervous/anxious.   All other systems reviewed and are negative.      Objective:   Physical Exam  Constitutional: She is oriented to person, place, and time. She appears well-developed and well-nourished.  HENT:  Head: Normocephalic and atraumatic.  Eyes: Conjunctivae and EOM are normal. Pupils are equal, round, and reactive to light.  Neck: Normal range of motion.  Neurological: She is alert and oriented to  person, place, and time. Coordination and gait normal.  Reflex Scores:      Patellar reflexes are 2+ on the right side and 2+ on the left side.      Achilles reflexes are 2+ on the right side and 2+ on the left side. 5/5 bilateral hip flexor, knee extensor, ankle dorsiflexor  Psychiatric: She has a normal mood and affect.  Nursing note and vitals reviewed. , no tenderness to Palpation over the greater trochanteric Bursa area     Assessment:     Chronic low back and right lower extremity pain. She has had equivocal relief with several spinal injections. She's had good relief of left sacroiliac pain after sacroiliac injection, however not with right sacroiliac injection. She had good relief with lumbar medial branch blocks L3, L4, L5, but not with radiofrequency of those same nerves. She's had no appreciable improvement after epidural injections at L3, L4, L5 and S1 levels.  Her MRI shows no evidence of significant spinal stenosis.      Plan:     Do not recommend any further spinal injections. She gets partial relief with a combination of tramadol 50 mg 3 times a day as well as Lyrica 75 mg twice a day

## 2016-03-31 NOTE — Patient Instructions (Signed)
Cont tramadol and lyrica  No other spine injections recommended  If you develop increasing left-sided low back pain. This may indicate that the left sacroiliac joint injection needs to be repeated

## 2016-04-04 DIAGNOSIS — R2681 Unsteadiness on feet: Secondary | ICD-10-CM | POA: Diagnosis not present

## 2016-04-04 DIAGNOSIS — M25552 Pain in left hip: Secondary | ICD-10-CM | POA: Diagnosis not present

## 2016-04-04 DIAGNOSIS — M7072 Other bursitis of hip, left hip: Secondary | ICD-10-CM | POA: Diagnosis not present

## 2016-04-06 DIAGNOSIS — M7072 Other bursitis of hip, left hip: Secondary | ICD-10-CM | POA: Diagnosis not present

## 2016-04-06 DIAGNOSIS — M25552 Pain in left hip: Secondary | ICD-10-CM | POA: Diagnosis not present

## 2016-04-06 DIAGNOSIS — R2681 Unsteadiness on feet: Secondary | ICD-10-CM | POA: Diagnosis not present

## 2016-04-12 ENCOUNTER — Other Ambulatory Visit: Payer: Self-pay | Admitting: *Deleted

## 2016-04-12 MED ORDER — PREGABALIN 75 MG PO CAPS: 75.0000 mg | ORAL_CAPSULE | Freq: Two times a day (BID) | ORAL | 1 refills | Status: DC

## 2016-04-20 ENCOUNTER — Ambulatory Visit: Payer: BLUE CROSS/BLUE SHIELD | Attending: Physical Medicine & Rehabilitation | Admitting: Physical Therapy

## 2016-04-20 DIAGNOSIS — M76891 Other specified enthesopathies of right lower limb, excluding foot: Secondary | ICD-10-CM | POA: Diagnosis not present

## 2016-04-20 DIAGNOSIS — M5441 Lumbago with sciatica, right side: Secondary | ICD-10-CM | POA: Insufficient documentation

## 2016-04-20 DIAGNOSIS — M76892 Other specified enthesopathies of left lower limb, excluding foot: Secondary | ICD-10-CM | POA: Diagnosis not present

## 2016-04-20 DIAGNOSIS — M7072 Other bursitis of hip, left hip: Secondary | ICD-10-CM | POA: Diagnosis not present

## 2016-04-20 NOTE — Therapy (Signed)
[facility] [address]  [place], [place], [address] Phone: [phone]   Fax:  [phone]  Physical Therapy Evaluation  Patient Details  Name: Whitney Daniel MRN: [id number] Date of Birth: 1956-02-03 Referring Provider: Dr. Alysia Penna  Encounter Date: 04/20/2016      PT End of Session - 04/20/16 0923    Visit Number 1   Number of Visits 1   PT Start Time 0840   PT Stop Time 0915   PT Time Calculation (min) 35 min   Activity Tolerance Patient tolerated treatment well   Behavior During Therapy [facility] for tasks assessed/performed      Past Medical History:  Diagnosis Date  . Anemia   . Cancer ([place])   . Hypertension   . Hypothyroidism   . Osteoarthritis   . SVD (spontaneous vaginal delivery)    x 5    Past Surgical History:  Procedure Laterality Date  . COLONOSCOPY    . DILATATION & CURRETTAGE/HYSTEROSCOPY WITH RESECTOCOPE N/A 07/15/2014   Procedure: DILATATION & CURETTAGE/HYSTEROSCOPY WITH RESECTOCOPE;  Surgeon: Marvene Staff, MD;  Location: [facility];  Service: Gynecology;  Laterality: N/A;  . DILATION AND CURETTAGE OF UTERUS     polyps  . right thigh surgery     cancer  . WISDOM TOOTH EXTRACTION      There were no vitals filed for this visit.       Subjective Assessment - 04/20/16 0844    Subjective Pt is a 60 y/o female who presents to OPPT for TENS unit evaluation.  Pt reports she had surgery in 2011 for radial resection of sarcoma on posterior upper RLE.  Pt reports pain since the surgery increasing in intensity.  Pt has had multiple ESI with minimal relief.  Plan now will be no more ESI but management with medication.   Pertinent History peripheral parasthesias   Limitations Sitting   How long can you sit comfortably? < 5 min   Patient Stated Goals learn how to use TENS unit   Currently in Pain? Yes   Pain Score 8    Pain Location Back   Pain Orientation Right;Lower   Pain  Descriptors / Indicators Aching   Pain Type Chronic pain;Neuropathic pain   Pain Radiating Towards RLE   Pain Onset More than a month ago   Pain Frequency Constant   Aggravating Factors  standing, walking, sitting   Pain Relieving Factors unknown            [facility] PT Assessment - 04/20/16 0849      Assessment   Medical Diagnosis LBP   Referring Provider Dr. Alysia Penna   Onset Date/Surgical Date --  2011   Prior Therapy n/a     Precautions   Precautions None     Balance Screen   Has the patient fallen in the past 6 months No   Has the patient had a decrease in activity level because of a fear of falling?  No   Is the patient reluctant to leave their home because of a fear of falling?  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  Pt return demonstrated understanding                PT Education - 04/20/16 0922    Education provided Yes   Education Details see self care   Person(s) Educated Patient   Methods Explanation   Comprehension Verbalized understanding              PT Long Term Goals - 04/20/16 0924      PT LONG TERM GOAL #1   Title verbalize understanding of application and use of home TENS unit    Status Achieved               Plan - 04/20/16 0923    Clinical Impression Statement Pt presents to OPPT for low complexity PT evaluation for instruction in home TENS unit.  Pt verbalized understanding and demonstrated proper use of unit.  One time evaluation.   PT Frequency One time visit   PT Treatment/Interventions Patient/family education;Electrical Stimulation   Consulted and Agree with Plan of Care Patient      Patient will benefit from skilled therapeutic intervention in order to improve the following deficits and impairments:  Pain  Visit Diagnosis: Lumbago with sciatica, right side - Plan: PT plan of care cert/re-cert     Problem List Patient Active Problem List   Diagnosis Date Noted  . 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 Right lumbar radiculitis 07/05/2015  . 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 Current tear knee, medial meniscus 04/30/2015  . History of sarcoma of soft tissue 04/27/2015  . Arthritis of knee, degenerative 03/05/2015  . Paresthesias 10/27/2014  . 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

## 2016-04-25 DIAGNOSIS — M7072 Other bursitis of hip, left hip: Secondary | ICD-10-CM | POA: Diagnosis not present

## 2016-04-25 DIAGNOSIS — M76891 Other specified enthesopathies of right lower limb, excluding foot: Secondary | ICD-10-CM | POA: Diagnosis not present

## 2016-04-25 DIAGNOSIS — M76892 Other specified enthesopathies of left lower limb, excluding foot: Secondary | ICD-10-CM | POA: Diagnosis not present

## 2016-04-27 DIAGNOSIS — M76892 Other specified enthesopathies of left lower limb, excluding foot: Secondary | ICD-10-CM | POA: Diagnosis not present

## 2016-04-27 DIAGNOSIS — M76891 Other specified enthesopathies of right lower limb, excluding foot: Secondary | ICD-10-CM | POA: Diagnosis not present

## 2016-04-27 DIAGNOSIS — M7072 Other bursitis of hip, left hip: Secondary | ICD-10-CM | POA: Diagnosis not present

## 2016-05-04 DIAGNOSIS — M76892 Other specified enthesopathies of left lower limb, excluding foot: Secondary | ICD-10-CM | POA: Diagnosis not present

## 2016-05-04 DIAGNOSIS — M7072 Other bursitis of hip, left hip: Secondary | ICD-10-CM | POA: Diagnosis not present

## 2016-05-04 DIAGNOSIS — M76891 Other specified enthesopathies of right lower limb, excluding foot: Secondary | ICD-10-CM | POA: Diagnosis not present

## 2016-05-11 DIAGNOSIS — M76891 Other specified enthesopathies of right lower limb, excluding foot: Secondary | ICD-10-CM | POA: Diagnosis not present

## 2016-05-11 DIAGNOSIS — M7072 Other bursitis of hip, left hip: Secondary | ICD-10-CM | POA: Diagnosis not present

## 2016-05-11 DIAGNOSIS — M76892 Other specified enthesopathies of left lower limb, excluding foot: Secondary | ICD-10-CM | POA: Diagnosis not present

## 2016-05-15 ENCOUNTER — Ambulatory Visit: Payer: 59 | Admitting: Physical Medicine & Rehabilitation

## 2016-05-15 DIAGNOSIS — M7062 Trochanteric bursitis, left hip: Secondary | ICD-10-CM | POA: Diagnosis not present

## 2016-05-15 DIAGNOSIS — M76892 Other specified enthesopathies of left lower limb, excluding foot: Secondary | ICD-10-CM | POA: Diagnosis not present

## 2016-05-15 DIAGNOSIS — M76891 Other specified enthesopathies of right lower limb, excluding foot: Secondary | ICD-10-CM | POA: Diagnosis not present

## 2016-05-18 ENCOUNTER — Ambulatory Visit (HOSPITAL_COMMUNITY)
Admission: RE | Admit: 2016-05-18 | Discharge: 2016-05-18 | Disposition: A | Discharge status: Home / Self Care | Payer: BLUE CROSS/BLUE SHIELD | Source: Ambulatory Visit | Attending: Gynecologic Oncology | Admitting: Gynecologic Oncology

## 2016-05-18 DIAGNOSIS — M47814 Spondylosis without myelopathy or radiculopathy, thoracic region: Secondary | ICD-10-CM | POA: Insufficient documentation

## 2016-05-18 DIAGNOSIS — M79651 Pain in right thigh: Secondary | ICD-10-CM | POA: Diagnosis not present

## 2016-05-18 DIAGNOSIS — M79604 Pain in right leg: Secondary | ICD-10-CM | POA: Insufficient documentation

## 2016-05-18 DIAGNOSIS — M4184 Other forms of scoliosis, thoracic region: Secondary | ICD-10-CM | POA: Insufficient documentation

## 2016-05-18 DIAGNOSIS — C499 Malignant neoplasm of connective and soft tissue, unspecified: Secondary | ICD-10-CM

## 2016-05-18 DIAGNOSIS — Z9889 Other specified postprocedural states: Secondary | ICD-10-CM | POA: Insufficient documentation

## 2016-05-18 DIAGNOSIS — R0602 Shortness of breath: Secondary | ICD-10-CM | POA: Diagnosis not present

## 2016-05-18 MED ORDER — GADOBENATE DIMEGLUMINE 529 MG/ML IV SOLN
18.0000 mL | Freq: Once | INTRAVENOUS | Status: AC | PRN
  Administered 2016-05-18: 18 mL via INTRAVENOUS

## 2016-05-19 ENCOUNTER — Encounter: Payer: Self-pay | Admitting: Physical Medicine & Rehabilitation

## 2016-05-19 ENCOUNTER — Encounter: Payer: BLUE CROSS/BLUE SHIELD | Attending: Physical Medicine & Rehabilitation

## 2016-05-19 ENCOUNTER — Ambulatory Visit (HOSPITAL_BASED_OUTPATIENT_CLINIC_OR_DEPARTMENT_OTHER): Payer: 59 | Admitting: Physical Medicine & Rehabilitation

## 2016-05-19 VITALS — BP 142/91 | HR 63 | Resp 14

## 2016-05-19 DIAGNOSIS — M7062 Trochanteric bursitis, left hip: Secondary | ICD-10-CM | POA: Insufficient documentation

## 2016-05-19 DIAGNOSIS — M792 Neuralgia and neuritis, unspecified: Secondary | ICD-10-CM | POA: Diagnosis not present

## 2016-05-19 DIAGNOSIS — M5136 Other intervertebral disc degeneration, lumbar region: Secondary | ICD-10-CM

## 2016-05-19 DIAGNOSIS — Z9889 Other specified postprocedural states: Secondary | ICD-10-CM | POA: Insufficient documentation

## 2016-05-19 DIAGNOSIS — M79651 Pain in right thigh: Secondary | ICD-10-CM | POA: Diagnosis not present

## 2016-05-19 DIAGNOSIS — Z79899 Other long term (current) drug therapy: Secondary | ICD-10-CM

## 2016-05-19 DIAGNOSIS — Z5181 Encounter for therapeutic drug level monitoring: Secondary | ICD-10-CM | POA: Diagnosis not present

## 2016-05-19 DIAGNOSIS — G894 Chronic pain syndrome: Secondary | ICD-10-CM

## 2016-05-19 MED ORDER — TAPENTADOL HCL 50 MG PO TABS: 50.0000 mg | ORAL_TABLET | Freq: Three times a day (TID) | ORAL | Status: DC | PRN

## 2016-05-19 NOTE — Progress Notes (Signed)
Subjective:    Patient ID: Whitney Daniel, female    DOB: [birth year], 58 y.o.   MRN: [id number]  HPI [facility].  Saw orthopedic surgeon, status post left hip bursectomy labral debridement arthroscopic at some poing to do the right side Working on core strengthening, awaiting pelvic strengthening program.  Patient has neuropathic pain from the sciatic nerve injury. Postoperative Did not tolerate gabapentin, gets some relief with Lyrica but .In addition she has chronic low back painwithMRI evidence ofdegenerative disc.Medialbranch blocks as well as sacroiliac injections were not helpful. Pain is worse with sitting. She did not ief with a TENS unit. Therefore, we discussed Bio wave is not likely to be helpful patient would like to discuss treatment options does not feel tramadol is very helpful  Pain Inventory Average Pain 7 Pain Right Now 6 My pain is constant and unsure  In the last 24 hours, has pain interfered with the following? General activity 4 Relation with others 2 Enjoyment of life 7 What TIME of day is your pain at its worst? night Sleep (in general) Fair  Pain is worse with: walking, sitting and standing Pain improves with: rest and medication Relief from Meds: 5  Mobility walk without assistance walk with assistance use a cane ability to climb steps?  yes Do you have any goals in this area?  yes  Function disabled: date disabled . Do you have any goals in this area?  yes  Neuro/Psych bladder control problems bowel control problems weakness numbness tremor tingling trouble walking dizziness  Prior Studies Any changes since last visit?  no  Physicians involved in your care Any changes since last visit?  no   Family History  Problem Relation Age of Onset  . Hypertension Mother   . CVA Mother   . Cancer Father   . Diabetes Father   . Diabetes Sister   . Heart attack Sister    Social History     Social History  . Marital status: Married    Spouse name: N/A  . Number of children: N/A  . Years of education: N/A   Social History Main Topics  . Smoking status: Never Smoker  . Smokeless tobacco: Never Used  . Alcohol use 0.0 oz/week     Comment: social - wine  . Drug use: No  . Sexual activity: Yes    Birth control/ protection: Post-menopausal   Other Topics Concern  . None   Social History Narrative   Lives alone in a one story home.  Has 5 children.  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    Past Surgical History:  Procedure Laterality Date  . COLONOSCOPY    . DILATATION & CURRETTAGE/HYSTEROSCOPY WITH RESECTOCOPE N/A 07/15/2014   Procedure: DILATATION & CURETTAGE/HYSTEROSCOPY WITH RESECTOCOPE;  Surgeon: Marvene Staff, MD;  Location: [facility];  Service: Gynecology;  Laterality: N/A;  . DILATION AND CURETTAGE OF UTERUS     polyps  . right thigh surgery     cancer  . WISDOM TOOTH EXTRACTION     Past Medical History:  Diagnosis Date  . Anemia   . Cancer ([place])   . Hypertension   . Hypothyroidism   . Osteoarthritis   . SVD (spontaneous vaginal delivery)    x 5   BP (!) 142/91 (BP Location: Left Arm, Patient Position: Sitting, Cuff Size: Large)  Pulse 63   Resp 14   SpO2 96%   Opioid Risk Score:   Fall Risk Score:  `1  Depression screen PHQ 2/9  Depression screen [facility] 2/9 06/04/2015 04/27/2015  Decreased Interest 3 3  Down, Depressed, Hopeless 1 1  PHQ - 2 Score 4 4  Altered sleeping - 3  Tired, decreased energy - 3  Change in appetite - 2  Feeling bad or failure about yourself  - 1  Trouble concentrating - 1  Moving slowly or fidgety/restless - 3  Suicidal thoughts - 2  PHQ-9 Score - 19  Difficult doing work/chores - Extremely dIfficult    Review of Systems  Constitutional: Positive for diaphoresis.  Respiratory: Positive for cough and shortness  of breath.   Gastrointestinal:       Incontinence   Endocrine:       High blood sugar  Genitourinary:       Incontinence  Musculoskeletal: Positive for back pain and gait problem.  Neurological: Positive for dizziness, tremors, weakness and numbness.       Tingling       Objective:   Physical Exam  Constitutional: She is oriented to person, place, and time. She appears well-developed and well-nourished.  HENT:  Head: Normocephalic and atraumatic.  Eyes: Conjunctivae and EOM are normal. Pupils are equal, round, and reactive to light.  Musculoskeletal:   tenderness lumbosacral junction, paraspinal  There is tenderness over the trochanteric bursa. No evidence of spinal deformity. Negative straight leg raising.   Neurological: She is alert and oriented to person, place, and time. She has normal strength. Gait normal.  Psychiatric: She has a normal mood and affect.  Nursing note and vitals reviewed.         Assessment & Plan:  1.  Chronic neuropathic pain, Sciatic nerve injury, unable to tolerate gabapentin, Lyrica with partial relief  2.  Chronic R sided low back pain, Likely discogenic and my be influenced by hip labral tear Agree with PT core strengthening Failed injections of SI and MBBs  Minimal relief with tramadol Trial nucynta 50mg  TID in place of tramadol and lyrica May need to titrate up, needs UDS and monthly monitoring if she stays on this med  Over half of the 25 min visit was spent counseling and coordinating care.

## 2016-05-19 NOTE — Addendum Note (Signed)
Addended by: Roland Rack on: 05/19/2016 03:23 PM   Modules accepted: Orders

## 2016-05-19 NOTE — Patient Instructions (Signed)
will substitute the nucynta for  tramadol and lyrica

## 2016-05-23 ENCOUNTER — Telehealth: Payer: Self-pay | Admitting: Physical Medicine & Rehabilitation

## 2016-05-23 ENCOUNTER — Encounter: Payer: Self-pay | Admitting: General Surgery

## 2016-05-23 ENCOUNTER — Ambulatory Visit (HOSPITAL_BASED_OUTPATIENT_CLINIC_OR_DEPARTMENT_OTHER): Payer: 59 | Admitting: General Surgery

## 2016-05-23 VITALS — BP 118/67 | HR 71 | Temp 98.7°F | Resp 18 | Wt 194.6 lb

## 2016-05-23 DIAGNOSIS — M79604 Pain in right leg: Secondary | ICD-10-CM

## 2016-05-23 DIAGNOSIS — G8929 Other chronic pain: Secondary | ICD-10-CM | POA: Diagnosis not present

## 2016-05-23 DIAGNOSIS — C4921 Malignant neoplasm of connective and soft tissue of right lower limb, including hip: Secondary | ICD-10-CM

## 2016-05-23 DIAGNOSIS — R319 Hematuria, unspecified: Secondary | ICD-10-CM | POA: Diagnosis not present

## 2016-05-23 DIAGNOSIS — Z85831 Personal history of malignant neoplasm of soft tissue: Secondary | ICD-10-CM | POA: Diagnosis not present

## 2016-05-23 NOTE — Telephone Encounter (Signed)
Patient was seen last Friday by Dr. Letta Pate and started on new medication Nucynta, but the prescription was never given to patient.  Patient would like to get this so she can pick up at the office.  Please call patient when completed.

## 2016-05-23 NOTE — Patient Instructions (Signed)
Plan to have an MRI of the right thigh and a chest xray on May 17, 2017 prior to your appointment with Dr. Rolanda Jay on Sept 18, 2018.  You can go to [facility] before or after your MRI to have your chest xray.  Please call for any questions, concerns, or new symptoms.  Symptoms to watch out for include a mass or lump felt in the area of the incision or increased pain.

## 2016-05-23 NOTE — Progress Notes (Signed)
I had the pleasure of meeting with Whitney Daniel today in routine follow-up for a T1, N0, M0, G1 (stage IB) fibromyxoid sarcoma resected at [facility] in 2011. She reports no new complaints related to her right leg other than the chronic pain she has had for many years. She denies any new masses or changes in her ability to walk or neurovascular symptoms. Physical examination: Examination was performed with a female chaperone in the room. Focused examination of the right lower extremity reveals a well-healed surgical incision that is longitudinally oriented on the right proximal posterior thigh. There is no evidence of masses or abnormality to suggest local recurrence.  Radiologic studies: MRI scan of the right femur was reviewed and copies of her reports were provided to her. There is no evidence of local recurrence in the right thigh.  2 view chest radiograph reveals no findings to suggest metastatic disease. Copies of this report was provided tumors well.  Impression: Patient status post resection of a stage IB low-grade fibromyxoid sarcoma of the right posterior thigh in long-term surveillance with no evidence of local recurrence or distant metastatic disease.  Plan: We reviewed in CCN guidelines and we will plan to see her back in 1 year with a 2 view chest radiograph to rule out metastatic disease and a right femoral MRI scan.  She indicated that she did follow up with Dr. Alinda Money of urology and recently had another episode of hematuria. I encouraged her to follow up with Dr. Alinda Money for further evaluation and treatment. She understands and agrees with the plan as outlined above.

## 2016-05-23 NOTE — Telephone Encounter (Signed)
According to my notes and review of Epic, it looks like. I wrote the prescription. Asked nursing to see if they have found any prescriptions for this patient on the printer Please check if patient had UDS performed as well. She will need this prior to prescribing

## 2016-05-24 NOTE — Telephone Encounter (Signed)
I searched high and low, did not find script on printer or in our shred-it bin. Patient provided urine specimen on date of visit (05/19/2016). Have not received results back

## 2016-05-25 NOTE — Telephone Encounter (Signed)
Let's wait till UDS before prescribing Also do pharmacy check make sure she didn't get a prescription and fillet somewhere

## 2016-05-26 LAB — TOXASSURE SELECT,+ANTIDEPR,UR

## 2016-05-30 ENCOUNTER — Other Ambulatory Visit: Payer: Self-pay | Admitting: *Deleted

## 2016-05-30 MED ORDER — TAPENTADOL HCL 50 MG PO TABS: 50.0000 mg | ORAL_TABLET | Freq: Two times a day (BID) | ORAL | 0 refills | Status: DC

## 2016-05-30 NOTE — Telephone Encounter (Signed)
May write prescription for Nucynta 50 mg twice a day over 60, no refills

## 2016-05-30 NOTE — Telephone Encounter (Signed)
UDS was consistent, NCCSR indicates that patient did not fill this script, and Epic indicates that Nucynta was originally ordered as 'facility administered'.....Marland Kitchenplease advise

## 2016-05-30 NOTE — Telephone Encounter (Signed)
Contacted patient and informed that she may come by and pick up her script

## 2016-06-06 NOTE — Progress Notes (Signed)
Urine drug screen for this encounter is consistent for prescribed medication 

## 2016-06-13 ENCOUNTER — Encounter: Payer: Self-pay | Admitting: Physical Medicine & Rehabilitation

## 2016-06-13 ENCOUNTER — Ambulatory Visit (HOSPITAL_BASED_OUTPATIENT_CLINIC_OR_DEPARTMENT_OTHER): Payer: 59 | Admitting: Physical Medicine & Rehabilitation

## 2016-06-13 ENCOUNTER — Encounter: Payer: BLUE CROSS/BLUE SHIELD | Attending: Physical Medicine & Rehabilitation

## 2016-06-13 VITALS — BP 135/79 | HR 76 | Resp 14

## 2016-06-13 DIAGNOSIS — M792 Neuralgia and neuritis, unspecified: Secondary | ICD-10-CM | POA: Diagnosis not present

## 2016-06-13 DIAGNOSIS — Z85831 Personal history of malignant neoplasm of soft tissue: Secondary | ICD-10-CM

## 2016-06-13 DIAGNOSIS — M79651 Pain in right thigh: Secondary | ICD-10-CM | POA: Diagnosis not present

## 2016-06-13 DIAGNOSIS — M7062 Trochanteric bursitis, left hip: Secondary | ICD-10-CM | POA: Insufficient documentation

## 2016-06-13 DIAGNOSIS — Z9889 Other specified postprocedural states: Secondary | ICD-10-CM | POA: Diagnosis not present

## 2016-06-13 DIAGNOSIS — M5136 Other intervertebral disc degeneration, lumbar region: Secondary | ICD-10-CM | POA: Insufficient documentation

## 2016-06-13 MED ORDER — TRAMADOL HCL 50 MG PO TABS: 50.0000 mg | ORAL_TABLET | Freq: Three times a day (TID) | ORAL | 1 refills | Status: DC

## 2016-06-13 MED ORDER — PREGABALIN 75 MG PO CAPS: 75.0000 mg | ORAL_CAPSULE | Freq: Three times a day (TID) | ORAL | 1 refills | Status: DC

## 2016-06-13 NOTE — Progress Notes (Signed)
Subjective:    Patient ID: Whitney Daniel, female    DOB: April 20, 1956, 60 y.o.   MRN: [id number]  HPI Dr Kara Dies at [facility] did labral debridement and bursectomy Left hip Right femur MRI showed no recurrence of sarcoma  Trial of Nucynta made her tired Did well with this at night  Took Tramadol @ 630 am 2 tabs with 1 Lyrica, repeated at 230p Nucynta 50mg  at night   Pain Inventory Average Pain 8 Pain Right Now 2 My pain is no selection  In the last 24 hours, has pain interfered with the following? General activity 8 Relation with others 8 Enjoyment of life 9 What TIME of day is your pain at its worst? all Sleep (in general) Fair  Pain is worse with: walking, standing and some activites Pain improves with: rest and medication Relief from Meds: no selection  Mobility walk without assistance  Function Do you have any goals in this area?  no  Neuro/Psych No problems in this area  Prior Studies Any changes since last visit?  no  Physicians involved in your care Any changes since last visit?  no   Family History  Problem Relation Age of Onset  . Hypertension Mother   . CVA Mother   . Cancer Father   . Diabetes Father   . Diabetes Sister   . Heart attack Sister    Social History   Social History  . Marital status: Married    Spouse name: N/A  . Number of children: N/A  . Years of education: N/A   Social History Main Topics  . Smoking status: Never Smoker  . Smokeless tobacco: Never Used  . Alcohol use 0.0 oz/week     Comment: social - wine  . Drug use: No  . Sexual activity: Yes    Birth control/ protection: Post-menopausal   Other Topics Concern  . None   Social History Narrative   Lives alone in a one story home.  Has 5 children.  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    Past Surgical History:  Procedure Laterality Date  . COLONOSCOPY    .  DILATATION & CURRETTAGE/HYSTEROSCOPY WITH RESECTOCOPE N/A 07/15/2014   Procedure: DILATATION & CURETTAGE/HYSTEROSCOPY WITH RESECTOCOPE;  Surgeon: Marvene Staff, MD;  Location: [facility];  Service: Gynecology;  Laterality: N/A;  . DILATION AND CURETTAGE OF UTERUS     polyps  . right thigh surgery     cancer  . WISDOM TOOTH EXTRACTION     Past Medical History:  Diagnosis Date  . Anemia   . Cancer ([place])   . Hypertension   . Hypothyroidism   . Osteoarthritis   . SVD (spontaneous vaginal delivery)    x 5   BP 135/79 (BP Location: Right Arm, Patient Position: Sitting, Cuff Size: Large)   Pulse 76   Resp 14   SpO2 95%   Opioid Risk Score:   Fall Risk Score:  `1  Depression screen PHQ 2/9  Depression screen [facility] 2/9 06/04/2015 04/27/2015  Decreased Interest 3 3  Down, Depressed, Hopeless 1 1  PHQ - 2 Score 4 4  Altered sleeping - 3  Tired, decreased energy - 3  Change in appetite - 2  Feeling bad or failure about yourself  - 1  Trouble concentrating - 1  Moving slowly or fidgety/restless - 3  Suicidal thoughts - 2  PHQ-9 Score -  19  Difficult doing work/chores - Extremely dIfficult    Review of Systems  Constitutional: Negative.   HENT: Negative.   Eyes: Negative.   Respiratory: Negative.   Cardiovascular: Negative.   Gastrointestinal: Negative.   Endocrine: Negative.   Genitourinary: Negative.   Musculoskeletal: Positive for arthralgias and back pain.  Skin: Negative.   Allergic/Immunologic: Negative.   Neurological: Negative.   Hematological: Negative.   Psychiatric/Behavioral: Negative.   All other systems reviewed and are negative.      Objective:   Physical Exam  Constitutional: She is oriented to person, place, and time. She appears well-developed and well-nourished.  HENT:  Head: Normocephalic and atraumatic.  Eyes: Conjunctivae and EOM are normal. Pupils are equal, round, and reactive to light.  Neck: Normal range of motion.  Neurological: She is  alert and oriented to person, place, and time.  Psychiatric: She has a normal mood and affect.  Nursing note and vitals reviewed.         Assessment & Plan:   1. Chronic sciatic pain, right lower extremity as well as history of lumbar spondylosis. We discussed her medication management, could not tolerate Nucynta during the day. He did get good relief at night. Discussed her pain levels throughout the day. She would benefit from the following regimen. Tramadol 100 mg 6:30 AM, 2:30 PM and daily at bedtime along with Lyrica 75 mg 3 times a day at the same times. Will see how her sleep does. Reevaluate and if this regimen does not find her with restful sleep, would consider discontinuing the daily at bedtime dose of Lyrica and tramadol and substituting Nucynta 50 mg daily at bedtime only Over half of the 25 min visit was spent counseling and coordinating care.

## 2016-06-13 NOTE — Patient Instructions (Signed)
Please call us if you need to see me before 2 months

## 2016-06-28 DIAGNOSIS — E7800 Pure hypercholesterolemia, unspecified: Secondary | ICD-10-CM | POA: Diagnosis not present

## 2016-06-28 DIAGNOSIS — R7303 Prediabetes: Secondary | ICD-10-CM | POA: Diagnosis not present

## 2016-06-28 DIAGNOSIS — Z23 Encounter for immunization: Secondary | ICD-10-CM | POA: Diagnosis not present

## 2016-06-28 DIAGNOSIS — I10 Essential (primary) hypertension: Secondary | ICD-10-CM | POA: Diagnosis not present

## 2016-06-28 DIAGNOSIS — E039 Hypothyroidism, unspecified: Secondary | ICD-10-CM | POA: Diagnosis not present

## 2016-06-28 DIAGNOSIS — F419 Anxiety disorder, unspecified: Secondary | ICD-10-CM | POA: Diagnosis not present

## 2016-06-30 DIAGNOSIS — M7989 Other specified soft tissue disorders: Secondary | ICD-10-CM | POA: Diagnosis not present

## 2016-06-30 DIAGNOSIS — Z885 Allergy status to narcotic agent status: Secondary | ICD-10-CM | POA: Diagnosis not present

## 2016-06-30 DIAGNOSIS — M79604 Pain in right leg: Secondary | ICD-10-CM | POA: Diagnosis not present

## 2016-06-30 DIAGNOSIS — I10 Essential (primary) hypertension: Secondary | ICD-10-CM | POA: Diagnosis not present

## 2016-06-30 DIAGNOSIS — E039 Hypothyroidism, unspecified: Secondary | ICD-10-CM | POA: Diagnosis not present

## 2016-06-30 DIAGNOSIS — Z79899 Other long term (current) drug therapy: Secondary | ICD-10-CM | POA: Diagnosis not present

## 2016-06-30 DIAGNOSIS — M791 Myalgia: Secondary | ICD-10-CM | POA: Diagnosis not present

## 2016-06-30 DIAGNOSIS — R072 Precordial pain: Secondary | ICD-10-CM | POA: Diagnosis not present

## 2016-06-30 DIAGNOSIS — Z780 Asymptomatic menopausal state: Secondary | ICD-10-CM | POA: Diagnosis not present

## 2016-07-13 DIAGNOSIS — Z86010 Personal history of colonic polyps: Secondary | ICD-10-CM | POA: Diagnosis not present

## 2016-07-13 DIAGNOSIS — Z1211 Encounter for screening for malignant neoplasm of colon: Secondary | ICD-10-CM | POA: Diagnosis not present

## 2016-07-14 ENCOUNTER — Telehealth: Payer: Self-pay | Admitting: Physical Medicine & Rehabilitation

## 2016-07-24 ENCOUNTER — Encounter: Payer: Self-pay | Admitting: Physical Medicine & Rehabilitation

## 2016-07-24 ENCOUNTER — Ambulatory Visit (HOSPITAL_BASED_OUTPATIENT_CLINIC_OR_DEPARTMENT_OTHER): Payer: 59 | Admitting: Physical Medicine & Rehabilitation

## 2016-07-24 ENCOUNTER — Encounter: Payer: BLUE CROSS/BLUE SHIELD | Attending: Physical Medicine & Rehabilitation

## 2016-07-24 VITALS — BP 149/91 | HR 74 | Resp 14

## 2016-07-24 DIAGNOSIS — M7062 Trochanteric bursitis, left hip: Secondary | ICD-10-CM | POA: Insufficient documentation

## 2016-07-24 DIAGNOSIS — Z9889 Other specified postprocedural states: Secondary | ICD-10-CM | POA: Insufficient documentation

## 2016-07-24 DIAGNOSIS — M79651 Pain in right thigh: Secondary | ICD-10-CM | POA: Diagnosis not present

## 2016-07-24 DIAGNOSIS — M47816 Spondylosis without myelopathy or radiculopathy, lumbar region: Secondary | ICD-10-CM | POA: Diagnosis not present

## 2016-07-24 DIAGNOSIS — Z85831 Personal history of malignant neoplasm of soft tissue: Secondary | ICD-10-CM

## 2016-07-24 DIAGNOSIS — M5136 Other intervertebral disc degeneration, lumbar region: Secondary | ICD-10-CM | POA: Insufficient documentation

## 2016-07-24 DIAGNOSIS — M792 Neuralgia and neuritis, unspecified: Secondary | ICD-10-CM | POA: Diagnosis not present

## 2016-07-24 NOTE — Progress Notes (Signed)
Subjective:    Patient ID: Butkus Skene, female    DOB: 01-19-1956, 60 y.o.   MRN: [id number]   HPI  Chief complaint is right buttocks pain Hx melanoma, Resection, right posterior thigh. Felt to have sciatic injury, however, neurology performed. EMG/NCV right lower extremity, which was normal. Has not had any weakness of the right lower extremity. Medial branch blocks Right L3, L4, L5 Helped short term, 100% Radiofrequency gave her short term relief, less than one month  Works from home, had a sit stand work station.  Written out of work by one of her other physicians, we discussed my responses to her insurance form.  Placed restrictions for sitting, standing, alternating every 15 minutes.    Pain Inventory Average Pain 7 Pain Right Now 7 My pain is constant and sharp  In the last 24 hours, has pain interfered with the following? General activity 7 Relation with others 7 Enjoyment of life 10 What TIME of day is your pain at its worst? all Sleep (in general) Fair  Pain is worse with: walking, sitting, standing and some activites Pain improves with: n/a Relief from Meds: Difficult to say. Currently is switching between medications  Mobility use a cane how many minutes can you walk? 5 ability to climb steps?  yes do you drive?  yes  Function employed # of hrs/week 0 not employed: date last employed 10-13-14 I need assistance with the following:  dressing, bathing, meal prep, household duties and shopping  Neuro/Psych bowel control problems tingling trouble walking depression anxiety  Prior Studies Any changes since last visit?  no  Physicians involved in your care Any changes since last visit?  no   Family History  Problem Relation Age of Onset  . Hypertension Mother   . CVA Mother   . Cancer Father   . Diabetes Father   . Diabetes Sister   . Heart attack Sister    Social History   Social History  . Marital status: Married    Spouse name: N/A  .  Number of children: N/A  . Years of education: N/A   Social History Main Topics  . Smoking status: Never Smoker  . Smokeless tobacco: Never Used  . Alcohol use 0.0 oz/week     Comment: social - wine  . Drug use: No  . Sexual activity: Yes    Birth control/ protection: Post-menopausal   Other Topics Concern  . None   Social History Narrative   Lives alone in a one story home.  Has 5 children.  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    Past Surgical History:  Procedure Laterality Date  . COLONOSCOPY    . DILATATION & CURRETTAGE/HYSTEROSCOPY WITH RESECTOCOPE N/A 07/15/2014   Procedure: DILATATION & CURETTAGE/HYSTEROSCOPY WITH RESECTOCOPE;  Surgeon: Marvene Staff, MD;  Location: [facility];  Service: Gynecology;  Laterality: N/A;  . DILATION AND CURETTAGE OF UTERUS     polyps  . right thigh surgery     cancer  . WISDOM TOOTH EXTRACTION     Past Medical History:  Diagnosis Date  . Anemia   . Cancer ([place])   . Hypertension   . Hypothyroidism   . Osteoarthritis   . SVD (spontaneous vaginal delivery)    x 5   Resp 14   Opioid Risk Score:   Fall Risk Score:  `1  Depression screen PHQ 2/9  Depression  screen [facility] 2/9 06/04/2015 04/27/2015  Decreased Interest 3 3  Down, Depressed, Hopeless 1 1  PHQ - 2 Score 4 4  Altered sleeping - 3  Tired, decreased energy - 3  Change in appetite - 2  Feeling bad or failure about yourself  - 1  Trouble concentrating - 1  Moving slowly or fidgety/restless - 3  Suicidal thoughts - 2  PHQ-9 Score - 19  Difficult doing work/chores - Extremely dIfficult   Review of Systems  Constitutional: Positive for unexpected weight change.       Night sweats   HENT: Negative.   Eyes: Negative.   Respiratory: Positive for cough.   Cardiovascular: Negative.   Gastrointestinal: Negative.   Endocrine:       High blood sugar  Genitourinary: Negative.    Musculoskeletal: Negative.   Skin: Negative.   Allergic/Immunologic: Negative.   Neurological: Negative.   Hematological: Negative.   Psychiatric/Behavioral: Negative.   All other systems reviewed and are negative.      Objective:   Physical Exam  Constitutional: She is oriented to person, place, and time. She appears well-developed and well-nourished.  HENT:  Head: Normocephalic and atraumatic.  Eyes: Conjunctivae and EOM are normal. Pupils are equal, round, and reactive to light.  Neurological: She is alert and oriented to person, place, and time.  Psychiatric: She has a normal mood and affect. Her behavior is normal. Judgment and thought content normal.  Nursing note and vitals reviewed.  4/5 right hip flexor 5/5, knee extensor 5/5. Ankle dorsi flexion, plantar flexion. 5/5 on the left side. Tenderness over bilateral greater trochanters. No tenderness over the lumbar spine area, but she states that the pain goes up all the way to the thoracic spine area. Lumbar spine has full range of motion with flexion states that this is a position of comfort. Extension is 50%, lateral bending is 50%. Left lateral bending is more painful than right lateral bending.  Ambulates without evidence of toe drag or knee instability. Hip range of motion is normal bilaterally. Knee and ankle range of motion normal bilaterally.       Assessment & Plan:  1. Chronic postsurgical pain. No objective evidence of sciatic neuropathy. Pain is mainly with sitting. Discussed need to alternating sitting, standing every 15 minutes. She has had follow-up femoral MRI showing no evidence of melanoma recurrence. EMG of the right lower extremity was normal. We discussed there is no reason for her pain to feel worse. We discussed that her pain is chronic and will be an ongoing issue for her. There may be some adjustments of medications, however, do not expect any medication to take away her pain completely For  now, we'll trial Nucynta 50 mg twice a day, she was hesitant to do this without discussing the matter further with me. If she gets good relief, but feels like he does not have sufficient duration of effect, may consider going up to 3 times a day or even 4 times a day. If she does not tolerate the Nucynta during the day may take it just at night and then restart tramadol 50 mg 3 times a day and Lyrica 75 mg 3 times a day during the day  Return to clinic in one month . If she continues on the Nucynta. Otherwise, we can see her in 6 months. If she decides to go back to Lyrica, tramadol combination  Over half of the 25 min visit was spent counseling and coordinating care.

## 2016-07-24 NOTE — Patient Instructions (Signed)
Please have PCP or one of your surgeons fill out your form. We discussed my opinion on this.  Please take Nucynta 50 mm twice a day, if you tolerate Nucynta well we can use this in place of Lyrica and tramadol Call in 1 week to inform us of how it is working

## 2016-07-25 ENCOUNTER — Ambulatory Visit: Payer: 59

## 2016-07-25 ENCOUNTER — Ambulatory Visit: Payer: 59 | Admitting: Physical Medicine & Rehabilitation

## 2016-07-25 DIAGNOSIS — M1711 Unilateral primary osteoarthritis, right knee: Secondary | ICD-10-CM | POA: Diagnosis not present

## 2016-07-25 DIAGNOSIS — Y33XXXA Other specified events, undetermined intent, initial encounter: Secondary | ICD-10-CM | POA: Diagnosis not present

## 2016-07-25 DIAGNOSIS — S83241A Other tear of medial meniscus, current injury, right knee, initial encounter: Secondary | ICD-10-CM | POA: Diagnosis not present

## 2016-07-31 DIAGNOSIS — M25552 Pain in left hip: Secondary | ICD-10-CM | POA: Diagnosis not present

## 2016-08-01 DIAGNOSIS — E876 Hypokalemia: Secondary | ICD-10-CM | POA: Diagnosis not present

## 2016-08-07 DIAGNOSIS — M19011 Primary osteoarthritis, right shoulder: Secondary | ICD-10-CM | POA: Diagnosis not present

## 2016-08-07 DIAGNOSIS — G8929 Other chronic pain: Secondary | ICD-10-CM | POA: Diagnosis not present

## 2016-08-07 DIAGNOSIS — M25511 Pain in right shoulder: Secondary | ICD-10-CM | POA: Diagnosis not present

## 2016-08-08 ENCOUNTER — Ambulatory Visit: Payer: 59 | Admitting: Physical Medicine & Rehabilitation

## 2016-08-08 DIAGNOSIS — M25551 Pain in right hip: Secondary | ICD-10-CM | POA: Diagnosis not present

## 2016-08-08 DIAGNOSIS — M76891 Other specified enthesopathies of right lower limb, excluding foot: Secondary | ICD-10-CM | POA: Diagnosis not present

## 2016-08-10 ENCOUNTER — Telehealth: Payer: Self-pay | Admitting: Physical Medicine & Rehabilitation

## 2016-08-10 NOTE — Telephone Encounter (Signed)
Contacted patient to clarify needs.  She was rescheduled because of issues on our end.  Patient is not needing refills for tramadol and lyrica at this point.  There was some confusion regarding these medications and her Rx for Nucynta. I carefully explained that nucynta was prescribed to replace the lyrica/tramadol combination.  Dr. Letta Pate notes stated that if the patient could tolerate the daytime dose of nucynta that tramadol and lyrica could be taken thre times during the day and nucynta could be taken 1 at night with the stressor to not take nucynta simultaneously with the lyrica and tramadol.  Patient able to understand and will call if there are any additional questions or need for refills

## 2016-08-10 NOTE — Telephone Encounter (Signed)
Patient had an appointment on 12/19 with Dr. Letta Pate, we had to change it due to [employer].  Patient will need refills on Tramadol and Lyrica.  Can these be called in for her.  She also has an appointment already scheduled in January.  Please call patient.

## 2016-08-22 ENCOUNTER — Ambulatory Visit: Payer: 59 | Admitting: Physical Medicine & Rehabilitation

## 2016-08-25 NOTE — Telephone Encounter (Signed)
wrk limit form req

## 2016-08-30 DIAGNOSIS — Z1231 Encounter for screening mammogram for malignant neoplasm of breast: Secondary | ICD-10-CM | POA: Diagnosis not present

## 2016-08-31 DIAGNOSIS — Z6829 Body mass index (BMI) 29.0-29.9, adult: Secondary | ICD-10-CM | POA: Diagnosis not present

## 2016-08-31 DIAGNOSIS — Z01419 Encounter for gynecological examination (general) (routine) without abnormal findings: Secondary | ICD-10-CM | POA: Diagnosis not present

## 2016-08-31 DIAGNOSIS — Z1151 Encounter for screening for human papillomavirus (HPV): Secondary | ICD-10-CM | POA: Diagnosis not present

## 2016-08-31 DIAGNOSIS — Z113 Encounter for screening for infections with a predominantly sexual mode of transmission: Secondary | ICD-10-CM | POA: Diagnosis not present

## 2016-09-05 DIAGNOSIS — N6489 Other specified disorders of breast: Secondary | ICD-10-CM | POA: Diagnosis not present

## 2016-09-11 ENCOUNTER — Other Ambulatory Visit: Payer: Self-pay | Admitting: *Deleted

## 2016-09-11 MED ORDER — TRAMADOL HCL 50 MG PO TABS: 50.0000 mg | ORAL_TABLET | Freq: Three times a day (TID) | ORAL | 1 refills | Status: DC

## 2016-09-12 DIAGNOSIS — R1031 Right lower quadrant pain: Secondary | ICD-10-CM | POA: Diagnosis not present

## 2016-09-13 DIAGNOSIS — Z1211 Encounter for screening for malignant neoplasm of colon: Secondary | ICD-10-CM | POA: Diagnosis not present

## 2016-10-02 ENCOUNTER — Ambulatory Visit (HOSPITAL_BASED_OUTPATIENT_CLINIC_OR_DEPARTMENT_OTHER): Payer: BLUE CROSS/BLUE SHIELD | Admitting: Physical Medicine & Rehabilitation

## 2016-10-02 ENCOUNTER — Encounter: Payer: BLUE CROSS/BLUE SHIELD | Attending: Physical Medicine & Rehabilitation

## 2016-10-02 ENCOUNTER — Encounter: Payer: Self-pay | Admitting: Physical Medicine & Rehabilitation

## 2016-10-02 VITALS — BP 135/83 | HR 71

## 2016-10-02 DIAGNOSIS — M5136 Other intervertebral disc degeneration, lumbar region: Secondary | ICD-10-CM | POA: Insufficient documentation

## 2016-10-02 DIAGNOSIS — M7062 Trochanteric bursitis, left hip: Secondary | ICD-10-CM | POA: Insufficient documentation

## 2016-10-02 DIAGNOSIS — M79651 Pain in right thigh: Secondary | ICD-10-CM | POA: Diagnosis not present

## 2016-10-02 DIAGNOSIS — Z85831 Personal history of malignant neoplasm of soft tissue: Secondary | ICD-10-CM

## 2016-10-02 DIAGNOSIS — M7061 Trochanteric bursitis, right hip: Secondary | ICD-10-CM | POA: Diagnosis not present

## 2016-10-02 DIAGNOSIS — Z9889 Other specified postprocedural states: Secondary | ICD-10-CM | POA: Diagnosis not present

## 2016-10-02 DIAGNOSIS — M792 Neuralgia and neuritis, unspecified: Secondary | ICD-10-CM | POA: Diagnosis not present

## 2016-10-02 NOTE — Progress Notes (Signed)
Subjective:    Patient ID: Whitney Daniel, female    DOB: April 26, 1956, 61 y.o.   MRN: [id number]  HPI 61 year old female with history of right lower extremity pain. She has a past history of melanoma status post resection, right posterior thigh and was felt to have sciatic nerve injury. However, EMG/NCV was normal. She's had no weakness. She underwent L5 and S1 transforaminal lumbar epidural steroid injections without improvements. She's had some short-term relief from right L3, L4, L5 medial branch blocks but no long-term relief with radiofrequency Patient has tried Nucynta and this was not very helpful for her She has been maintained on tramadol 50 mg 3 times a day and Lyrica 75 mg 3 times a day  She has undergone left hip bursectomy by orthopedics and is scheduled to have right hip bursectomy. She has also been told that she would be a good candidate for right total knee replacement Pain Inventory Average Pain 5 Pain Right Now 5 My pain is constant and ?  In the last 24 hours, has pain interfered with the following? General activity 6 Relation with others 7 Enjoyment of life 7 What TIME of day is your pain at its worst? all Sleep (in general) Poor  Pain is worse with: walking, standing and some activites Pain improves with: medication Relief from Meds: 5  Mobility walk without assistance  Function disabled: date disabled .  Neuro/Psych bladder control problems weakness numbness tremor tingling trouble walking dizziness confusion depression anxiety  Prior Studies Any changes since last visit?  no  Physicians involved in your care Any changes since last visit?  no   Family History  Problem Relation Age of Onset  . Hypertension Mother   . CVA Mother   . Cancer Father   . Diabetes Father   . Diabetes Sister   . Heart attack Sister    Social History   Social History  . Marital status: Married    Spouse name: N/A  . Number of children: N/A  . Years of  education: N/A   Social History Main Topics  . Smoking status: Never Smoker  . Smokeless tobacco: Never Used  . Alcohol use 0.0 oz/week     Comment: social - wine  . Drug use: No  . Sexual activity: Yes    Birth control/ protection: Post-menopausal   Other Topics Concern  . None   Social History Narrative   Lives alone in a one story home.  Has 5 children.  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    Past Surgical History:  Procedure Laterality Date  . COLONOSCOPY    . DILATATION & CURRETTAGE/HYSTEROSCOPY WITH RESECTOCOPE N/A 07/15/2014   Procedure: DILATATION & CURETTAGE/HYSTEROSCOPY WITH RESECTOCOPE;  Surgeon: Marvene Staff, MD;  Location: [facility];  Service: Gynecology;  Laterality: N/A;  . DILATION AND CURETTAGE OF UTERUS     polyps  . right thigh surgery     cancer  . WISDOM TOOTH EXTRACTION     Past Medical History:  Diagnosis Date  . Anemia   . Cancer ([place])   . Hypertension   . Hypothyroidism   . Osteoarthritis   . SVD (spontaneous vaginal delivery)    x 5   BP 135/83   Pulse 71   SpO2 98%   Opioid Risk Score:   Fall Risk Score:  `1  Depression screen PHQ 2/9  Depression screen PHQ  2/9 06/04/2015 04/27/2015  Decreased Interest 3 3  Down, Depressed, Hopeless 1 1  PHQ - 2 Score 4 4  Altered sleeping - 3  Tired, decreased energy - 3  Change in appetite - 2  Feeling bad or failure about yourself  - 1  Trouble concentrating - 1  Moving slowly or fidgety/restless - 3  Suicidal thoughts - 2  PHQ-9 Score - 19  Difficult doing work/chores - Extremely dIfficult    Review of Systems  Eyes: Negative.   Respiratory: Positive for shortness of breath.        Asthma  Cardiovascular: Negative.   Gastrointestinal: Negative.   Endocrine: Negative.   Genitourinary: Positive for hematuria.  Musculoskeletal: Positive for back pain and gait problem.  Skin: Negative.    Allergic/Immunologic: Negative.   Neurological: Positive for dizziness, tremors, weakness and numbness.       Tingling  Hematological: Negative.   Psychiatric/Behavioral: Positive for confusion and dysphoric mood. The patient is nervous/anxious.        Objective:   Physical Exam  Constitutional: She is oriented to person, place, and time. She appears well-developed and well-nourished.  HENT:  Head: Normocephalic and atraumatic.  Eyes: Conjunctivae and EOM are normal. Pupils are equal, round, and reactive to light.  Musculoskeletal:       Right hip: She exhibits bony tenderness.       Left hip: She exhibits no bony tenderness.       Right knee: She exhibits normal range of motion. No tenderness found.  Neurological: She is alert and oriented to person, place, and time. She has normal strength. Gait normal.  Skin: Skin is warm and dry.  Psychiatric: She has a normal mood and affect. Her behavior is normal. Judgment and thought content normal.  Nursing note and vitals reviewed.         Assessment & Plan:  , 1. Chronic right lower extremity pain is likely multifactorial. There is prior history of malignant melanoma in the region of the sciatic nerve She does have right trochanteric bursa tenderness, her sciatic symptoms are more subjective rather than objective. Given lack of weakness, numbness, and normal EMG/NCV findings. For now, she'll continue on tramadol 50 3 times a day Continue Lyrica 75 mg 3 times a day This combination has allowed her to remain mobile and independent. I would like to see her back after she heals from her bursectomy, she may have some further relief of her chronic right lower extremity pain and we may consider tapering her tramadol

## 2016-10-16 ENCOUNTER — Other Ambulatory Visit: Payer: Self-pay

## 2016-10-16 MED ORDER — PREGABALIN 75 MG PO CAPS
75.0000 mg | ORAL_CAPSULE | Freq: Three times a day (TID) | ORAL | 1 refills | Status: DC
Start: 2016-10-16 — End: 2017-02-02

## 2016-10-24 DIAGNOSIS — M1712 Unilateral primary osteoarthritis, left knee: Secondary | ICD-10-CM | POA: Diagnosis not present

## 2016-10-24 DIAGNOSIS — M25562 Pain in left knee: Secondary | ICD-10-CM | POA: Diagnosis not present

## 2016-11-30 ENCOUNTER — Other Ambulatory Visit: Payer: Self-pay | Admitting: Nurse Practitioner

## 2016-11-30 DIAGNOSIS — B182 Chronic viral hepatitis C: Secondary | ICD-10-CM | POA: Diagnosis not present

## 2016-11-30 DIAGNOSIS — K7400 Hepatic fibrosis, unspecified: Secondary | ICD-10-CM

## 2016-11-30 DIAGNOSIS — M7751 Other enthesopathy of right foot: Secondary | ICD-10-CM | POA: Diagnosis not present

## 2016-11-30 DIAGNOSIS — M71572 Other bursitis, not elsewhere classified, left ankle and foot: Secondary | ICD-10-CM | POA: Diagnosis not present

## 2016-11-30 DIAGNOSIS — G5790 Unspecified mononeuropathy of unspecified lower limb: Secondary | ICD-10-CM | POA: Diagnosis not present

## 2016-11-30 DIAGNOSIS — M7752 Other enthesopathy of left foot: Secondary | ICD-10-CM | POA: Diagnosis not present

## 2016-11-30 DIAGNOSIS — M71571 Other bursitis, not elsewhere classified, right ankle and foot: Secondary | ICD-10-CM | POA: Diagnosis not present

## 2016-12-07 ENCOUNTER — Ambulatory Visit
Admission: RE | Admit: 2016-12-07 | Discharge: 2016-12-07 | Disposition: A | Discharge status: Home / Self Care | Payer: BLUE CROSS/BLUE SHIELD | Source: Ambulatory Visit | Attending: Nurse Practitioner | Admitting: Nurse Practitioner

## 2016-12-07 DIAGNOSIS — K7400 Hepatic fibrosis, unspecified: Secondary | ICD-10-CM

## 2016-12-21 DIAGNOSIS — M7751 Other enthesopathy of right foot: Secondary | ICD-10-CM | POA: Diagnosis not present

## 2016-12-21 DIAGNOSIS — M71571 Other bursitis, not elsewhere classified, right ankle and foot: Secondary | ICD-10-CM | POA: Diagnosis not present

## 2016-12-21 DIAGNOSIS — L910 Hypertrophic scar: Secondary | ICD-10-CM | POA: Diagnosis not present

## 2016-12-21 DIAGNOSIS — M7752 Other enthesopathy of left foot: Secondary | ICD-10-CM | POA: Diagnosis not present

## 2016-12-21 DIAGNOSIS — M71572 Other bursitis, not elsewhere classified, left ankle and foot: Secondary | ICD-10-CM | POA: Diagnosis not present

## 2016-12-22 ENCOUNTER — Telehealth: Payer: Self-pay

## 2016-12-22 ENCOUNTER — Other Ambulatory Visit: Payer: Self-pay

## 2016-12-22 MED ORDER — TRAMADOL HCL 50 MG PO TABS: 50.0000 mg | ORAL_TABLET | Freq: Three times a day (TID) | ORAL | 3 refills | Status: DC

## 2016-12-22 NOTE — Telephone Encounter (Signed)
Patient would like a refill on medication Tramadol. Patient next appointment is schedule for 03/30/2017. Is it okay to refill this medication. Last refill according to medication list 09/11/2016 Please advise

## 2017-01-10 DIAGNOSIS — I10 Essential (primary) hypertension: Secondary | ICD-10-CM | POA: Diagnosis not present

## 2017-01-10 DIAGNOSIS — R7303 Prediabetes: Secondary | ICD-10-CM | POA: Diagnosis not present

## 2017-01-10 DIAGNOSIS — E039 Hypothyroidism, unspecified: Secondary | ICD-10-CM | POA: Diagnosis not present

## 2017-01-10 DIAGNOSIS — E7800 Pure hypercholesterolemia, unspecified: Secondary | ICD-10-CM | POA: Diagnosis not present

## 2017-01-26 ENCOUNTER — Emergency Department (HOSPITAL_COMMUNITY): Payer: BLUE CROSS/BLUE SHIELD

## 2017-01-26 ENCOUNTER — Ambulatory Visit: Payer: BLUE CROSS/BLUE SHIELD | Admitting: Physical Medicine & Rehabilitation

## 2017-01-26 ENCOUNTER — Emergency Department (HOSPITAL_BASED_OUTPATIENT_CLINIC_OR_DEPARTMENT_OTHER)
Admission: EM | Admit: 2017-01-26 | Discharge: 2017-01-26 | Disposition: A | Discharge status: Home / Self Care | Payer: BLUE CROSS/BLUE SHIELD | Attending: Emergency Medicine | Admitting: Emergency Medicine

## 2017-01-26 ENCOUNTER — Emergency Department (HOSPITAL_BASED_OUTPATIENT_CLINIC_OR_DEPARTMENT_OTHER): Payer: BLUE CROSS/BLUE SHIELD

## 2017-01-26 ENCOUNTER — Encounter (HOSPITAL_BASED_OUTPATIENT_CLINIC_OR_DEPARTMENT_OTHER): Payer: Self-pay | Admitting: *Deleted

## 2017-01-26 DIAGNOSIS — S40861A Insect bite (nonvenomous) of right upper arm, initial encounter: Secondary | ICD-10-CM | POA: Diagnosis not present

## 2017-01-26 DIAGNOSIS — R52 Pain, unspecified: Secondary | ICD-10-CM

## 2017-01-26 DIAGNOSIS — W57XXXA Bitten or stung by nonvenomous insect and other nonvenomous arthropods, initial encounter: Secondary | ICD-10-CM | POA: Diagnosis not present

## 2017-01-26 DIAGNOSIS — Z79899 Other long term (current) drug therapy: Secondary | ICD-10-CM | POA: Diagnosis not present

## 2017-01-26 DIAGNOSIS — Y939 Activity, unspecified: Secondary | ICD-10-CM | POA: Diagnosis not present

## 2017-01-26 DIAGNOSIS — I10 Essential (primary) hypertension: Secondary | ICD-10-CM | POA: Insufficient documentation

## 2017-01-26 DIAGNOSIS — R9431 Abnormal electrocardiogram [ECG] [EKG]: Secondary | ICD-10-CM | POA: Diagnosis not present

## 2017-01-26 DIAGNOSIS — M791 Myalgia: Secondary | ICD-10-CM | POA: Insufficient documentation

## 2017-01-26 DIAGNOSIS — S20461A Insect bite (nonvenomous) of right back wall of thorax, initial encounter: Secondary | ICD-10-CM | POA: Insufficient documentation

## 2017-01-26 DIAGNOSIS — S50361A Insect bite (nonvenomous) of right elbow, initial encounter: Secondary | ICD-10-CM | POA: Diagnosis not present

## 2017-01-26 DIAGNOSIS — Y929 Unspecified place or not applicable: Secondary | ICD-10-CM | POA: Diagnosis not present

## 2017-01-26 DIAGNOSIS — R202 Paresthesia of skin: Secondary | ICD-10-CM | POA: Diagnosis not present

## 2017-01-26 DIAGNOSIS — Y999 Unspecified external cause status: Secondary | ICD-10-CM | POA: Insufficient documentation

## 2017-01-26 DIAGNOSIS — R51 Headache: Secondary | ICD-10-CM | POA: Insufficient documentation

## 2017-01-26 LAB — CBC WITH DIFFERENTIAL/PLATELET
BASOS ABS: 0 10*3/uL (ref 0.0–0.1)
Basophils Relative: 1 %
EOS PCT: 2 %
Eosinophils Absolute: 0.1 10*3/uL (ref 0.0–0.7)
HEMATOCRIT: 38.3 % (ref 36.0–46.0)
Hemoglobin: 12.8 g/dL (ref 12.0–15.0)
LYMPHS ABS: 1.3 10*3/uL (ref 0.7–4.0)
LYMPHS PCT: 22 %
MCH: 28.3 pg (ref 26.0–34.0)
MCHC: 33.4 g/dL (ref 30.0–36.0)
MCV: 84.5 fL (ref 78.0–100.0)
MONO ABS: 0.4 10*3/uL (ref 0.1–1.0)
Monocytes Relative: 7 %
NEUTROS ABS: 4.1 10*3/uL (ref 1.7–7.7)
Neutrophils Relative %: 68 %
PLATELETS: 282 10*3/uL (ref 150–400)
RBC: 4.53 MIL/uL (ref 3.87–5.11)
RDW: 14.5 % (ref 11.5–15.5)
WBC: 5.9 10*3/uL (ref 4.0–10.5)

## 2017-01-26 LAB — URINALYSIS, ROUTINE W REFLEX MICROSCOPIC
Bilirubin Urine: NEGATIVE
GLUCOSE, UA: NEGATIVE mg/dL
HGB URINE DIPSTICK: NEGATIVE
Ketones, ur: NEGATIVE mg/dL
Nitrite: NEGATIVE
PH: 7.5 (ref 5.0–8.0)
PROTEIN: NEGATIVE mg/dL
Specific Gravity, Urine: 1.016 (ref 1.005–1.030)

## 2017-01-26 LAB — COMPREHENSIVE METABOLIC PANEL
ALBUMIN: 3.9 g/dL (ref 3.5–5.0)
ALT: 14 U/L (ref 14–54)
ANION GAP: 8 (ref 5–15)
AST: 27 U/L (ref 15–41)
Alkaline Phosphatase: 82 U/L (ref 38–126)
BUN: 12 mg/dL (ref 6–20)
CHLORIDE: 105 mmol/L (ref 101–111)
CO2: 25 mmol/L (ref 22–32)
Calcium: 10.1 mg/dL (ref 8.9–10.3)
Creatinine, Ser: 0.83 mg/dL (ref 0.44–1.00)
GFR calc Af Amer: >60 mL/min (ref >60)
GFR calc non Af Amer: >60 mL/min (ref >60)
GLUCOSE: 137 mg/dL — AB (ref 65–99)
POTASSIUM: 3.6 mmol/L (ref 3.5–5.1)
Sodium: 138 mmol/L (ref 135–145)
Total Bilirubin: 0.3 mg/dL (ref 0.3–1.2)
Total Protein: 7.8 g/dL (ref 6.5–8.1)

## 2017-01-26 LAB — URINALYSIS, MICROSCOPIC (REFLEX)

## 2017-01-26 MED ORDER — DOXYCYCLINE HYCLATE 100 MG PO CAPS: 100.0000 mg | ORAL_CAPSULE | Freq: Two times a day (BID) | ORAL | 0 refills | Status: DC

## 2017-01-26 NOTE — ED Provider Notes (Signed)
[place] DEPT Provider Note   CSN: [id number] Arrival date & time: 01/26/17  1030     History   Chief Complaint Chief Complaint  Patient presents with  . Wound Check    HPI Whitney Daniel is a 61 y.o. female.  She presents for evaluation of numbness and weakness on the right side, which started yesterday.  She is also had a right-sided headache, for 6 days.  She was evaluated earlier today, at [facility], emergency department, and sent here for MRI imaging of the brain.  Patient also complains of generalized muscle and joint achiness present for several days, and recurrence.  She denies fever, chills, nausea or vomiting.  She is able to walk and drive without problems today.  There are no other known modifying factors.  HPI  Past Medical History:  Diagnosis Date  . Anemia   . Cancer ([place])   . Hypertension   . Hypothyroidism   . Osteoarthritis   . SVD (spontaneous vaginal delivery)    x 5    Patient Active Problem List   Diagnosis Date Noted  . Greater trochanteric bursitis of right hip 10/02/2016  . 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 Right lumbar radiculitis 07/05/2015  . 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 Current tear knee, medial meniscus 04/30/2015  . History of sarcoma of soft tissue 04/27/2015  . Arthritis of knee, degenerative 03/05/2015  . Paresthesias 10/27/2014  . 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 COLONOSCOPY    . DILATATION & CURRETTAGE/HYSTEROSCOPY WITH RESECTOCOPE N/A 07/15/2014   Procedure: DILATATION & CURETTAGE/HYSTEROSCOPY WITH RESECTOCOPE;  Surgeon: Marvene Staff, MD;  Location: [facility];  Service: Gynecology;  Laterality: N/A;  . DILATION AND CURETTAGE OF UTERUS     polyps  . right thigh surgery     cancer  . WISDOM TOOTH EXTRACTION      OB History    No data available       Home Medications    Prior to Admission medications   Medication Sig Start Date End Date Taking? Authorizing Provider  albuterol (PROVENTIL HFA;VENTOLIN HFA) 108 (90 BASE) MCG/ACT inhaler Inhale 2 puffs into the lungs every 6 (six) hours as needed for wheezing.   Yes [provider]  budesonide-formoterol (SYMBICORT) 160-4.5 MCG/ACT inhaler Inhale 2 puffs into the lungs 2 (two) times daily.   Yes [provider]  hydrochlorothiazide (HYDRODIURIL) 25 MG tablet Take 25 mg by mouth daily.   Yes [provider]  hydrOXYzine (ATARAX/VISTARIL) 25 MG tablet Take 25-50 mg by mouth every 8 (eight) hours as needed for itching.   Yes [provider]  levothyroxine (SYNTHROID, LEVOTHROID) 88 MCG tablet Take 88 mcg by mouth daily before breakfast.   Yes [provider]  Multiple Vitamin (MULTIVITAMIN WITH MINERALS) TABS Take 1 tablet by mouth daily.   Yes [provider]  naproxen (NAPROSYN) 250 MG tablet Take 250 mg by mouth.   Yes [provider]  ondansetron (ZOFRAN ODT) 4 MG disintegrating tablet Take 1 tablet (4 mg total) by mouth every 8 (eight) hours as needed for nausea or vomiting. 03/19/15  Yes Gareth Morgan, MD  pregabalin (LYRICA) 75 MG capsule Take 1 capsule (75 mg total) by mouth 3 (three) times daily. 10/16/16  Yes Kirsteins, Luanna Salk, MD  tapentadol (  NUCYNTA) 50 MG tablet Take 1 tablet (50 mg total) by mouth 2 (two) times daily. 05/30/16  Yes Kirsteins, Luanna Salk, MD  traMADol (ULTRAM) 50 MG tablet Take 1 tablet (50 mg total) by mouth 3 (three) times daily. 12/22/16  Yes Kirsteins, Luanna Salk, MD  doxycycline (VIBRAMYCIN) 100 MG capsule Take 1 capsule (100 mg total) by mouth 2 (two) times daily. 01/26/17   Quintella Reichert, MD    Family History Family History  Problem Relation Age of Onset  . Hypertension Mother   . CVA Mother   . Cancer Father   .  Diabetes Father   . Diabetes Sister   . Heart attack Sister     Social History Social History  Substance Use Topics  . Smoking status: Never Smoker  . Smokeless tobacco: Never Used  . Alcohol use 0.0 oz/week     Comment: social - wine     Allergies   Codeine and Vicodin [hydrocodone-acetaminophen]   Review of Systems Review of Systems  All other systems reviewed and are negative.    Physical Exam Updated Vital Signs BP (!) 173/99   Pulse 66   Temp 97.7 F (36.5 C)   Resp 16   Ht 5' 8.5" (1.74 m)   Wt 84.4 kg (186 lb)   SpO2 99%   BMI 27.87 kg/m   Physical Exam  Constitutional: She is oriented to person, place, and time. She appears well-developed and well-nourished. No distress.  HENT:  Head: Normocephalic and atraumatic.  Eyes: Conjunctivae and EOM are normal. Pupils are equal, round, and reactive to light.  Neck: Normal range of motion and phonation normal. Neck supple.  Cardiovascular: Normal rate and regular rhythm.   Pulmonary/Chest: Effort normal and breath sounds normal. She exhibits no tenderness.  Abdominal: Soft. She exhibits no distension. There is no tenderness. There is no guarding.  Musculoskeletal: Normal range of motion.  Neurological: She is alert and oriented to person, place, and time. She exhibits normal muscle tone.  No dysarthria aphasia or nystagmus.  Skin: Skin is warm and dry.  Right antecubital space with indistinct reddened area with small areas of elevation, possibly consistent with insect bite.  No frank cellulitis or abscess.  Psychiatric: She has a normal mood and affect. Her behavior is normal. Judgment and thought content normal.  Nursing note and vitals reviewed.    ED Treatments / Results  Labs (all labs ordered are listed, but only abnormal results are displayed) Labs Reviewed  COMPREHENSIVE METABOLIC PANEL - Abnormal; Notable for the following:       Result Value   Glucose, Bld 137 (*)    All other components within  normal limits  URINALYSIS, ROUTINE W REFLEX MICROSCOPIC - Abnormal; Notable for the following:    APPearance CLOUDY (*)    Leukocytes, UA SMALL (*)    All other components within normal limits  URINALYSIS, MICROSCOPIC (REFLEX) - Abnormal; Notable for the following:    Bacteria, UA RARE (*)    Squamous Epithelial / LPF 0-5 (*)    All other components within normal limits  CBC WITH DIFFERENTIAL/PLATELET    EKG  EKG Interpretation  Date/Time:  Friday Jan 26 2017 11:39:02 EDT Ventricular Rate:  62 PR Interval:    QRS Duration: 100 QT Interval:  405 QTC Calculation: 412 R Axis:   -70 Text Interpretation:  Sinus rhythm Left anterior fascicular block Abnormal R-wave progression, late transition Confirmed by Hazle Coca [phone]) on 01/26/2017 11:55:13 AM  Radiology Ct Head Wo Contrast  Result Date: 01/26/2017 CLINICAL DATA:  Right side headache and blurred vision beginning yesterday. No known injury. EXAM: CT HEAD WITHOUT CONTRAST TECHNIQUE: Contiguous axial images were obtained from the base of the skull through the vertex without intravenous contrast. COMPARISON:  Head CT scan 07/04/2013. Brain MRI 11 scratch the brain MRI 09/14/2014. FINDINGS: Brain: Appears normal without hemorrhage, infarct, mass lesion, mass effect, midline shift or abnormal extra-axial fluid collection. No hydrocephalus or pneumocephalus. Vascular: Negative. Skull: Intact. Sinuses/Orbits: Negative. Other: None. IMPRESSION: Negative head CT. Electronically Signed   By: Inge Rise M.D.   On: 01/26/2017 11:36   Mr Brain Wo Contrast  Result Date: 01/26/2017 CLINICAL DATA:  61 year old female status post but bite to right arm three days ago. Right side headache and pain. Initial encounter. EXAM: MRI HEAD WITHOUT CONTRAST TECHNIQUE: Multiplanar, multiecho pulse sequences of the brain and surrounding structures were obtained without intravenous contrast. COMPARISON:  Head CT without contrast 1125 hours today. Brain  MRI, head and neck MRA 09/14/2014 FINDINGS: Brain: No restricted diffusion to suggest acute infarction. No midline shift, mass effect, evidence of mass lesion, ventriculomegaly, extra-axial collection or acute intracranial hemorrhage. Cervicomedullary junction and pituitary are within normal limits. Stable cerebral volume. Stable gray and white matter signal throughout the brain. Occasional nonspecific subcortical and periventricular white matter T2 and FLAIR hyperintensity has not changed since 2016. No cortical encephalomalacia or chronic cerebral blood products. Deep gray matter nuclei, brainstem, and cerebellum remain normal. Vascular: Major intracranial vascular flow voids are stable. Skull and upper cervical spine: Negative. Visible bone marrow signal is within normal limits. Sinuses/Orbits: Stable and negative. Other: Visible internal auditory structures appear normal. Negative scalp soft tissues. IMPRESSION: No acute intracranial abnormality. Stable since 2016 and largely unremarkable for age noncontrast MRI appearance of the brain. Electronically Signed   By: Genevie Ann M.D.   On: 01/26/2017 15:30    Procedures Procedures (including critical care time)  Medications Ordered in ED Medications - No data to display   Initial Impression / Assessment and Plan / ED Course  I have reviewed the triage vital signs and the nursing notes.  Pertinent labs & imaging results that were available during my care of the patient were reviewed by me and considered in my medical decision making (see chart for details).      Patient Vitals for the past 24 hrs:  BP Temp Temp src Pulse Resp SpO2 Height Weight  01/26/17 1613 (!) 173/99 - - 66 16 99 % - -  01/26/17 1551 - 97.7 F (36.5 C) - - - - - -  01/26/17 1419 (!) 154/97 - - 70 16 100 % - -  01/26/17 1244 (!) 149/89 - - 66 17 100 % - -  01/26/17 1044 (!) 152/88 99.9 F (37.7 C) Oral 63 18 100 % - -  01/26/17 1040 - - - - - - 5' 8.5" (1.74 m) 84.4 kg (186  lb)    4:25 PM Reevaluation with update and discussion. After initial assessment and treatment, an updated evaluation reveals she remains comfortable, no change in physical exam, findings discussed with the patient and all questions answered. Gissela Bloch L    Final Clinical Impressions(s) / ED Diagnoses   Final diagnoses:  Paresthesia  Insect bite, initial encounter  Body aches   Nonspecific paresthesia, and headache.  Doubt CVA, meningitis, spinal myelopathy.  Rash right arm, nonspecific post insect bite.  Patient previously recommended to use doxycycline for possible infection.  Nursing Notes Reviewed/ Care Coordinated Applicable Imaging Reviewed Interpretation of Laboratory Data incorporated into ED treatment  The patient appears reasonably screened and/or stabilized for discharge and I doubt any other medical condition or other [facility] requiring further screening, evaluation, or treatment in the ED at this time prior to discharge.  Plan: Home Medications-continue usual medications; Home Treatments-rest, fluids; return here if the recommended treatment, does not improve the symptoms; Recommended follow up-PCP follow-up 1 week and as needed.   New Prescriptions Discharge Medication List as of 01/26/2017  4:08 PM    START taking these medications   Details  doxycycline (VIBRAMYCIN) 100 MG capsule Take 1 capsule (100 mg total) by mouth 2 (two) times daily., Starting Fri 01/26/2017, Print         Daleen Bo, MD 01/26/17 [phone]

## 2017-01-26 NOTE — Discharge Instructions (Addendum)
There was no sign of stroke, on the imaging MRI today.  See results below.  Take the antibiotic prescribed earlier today, to treat for possible right arm infection.  You could also use a warm compress on the sore area 2 or 3 times a day to improve the recovery.  CLINICAL DATA:  61 year old female status post but bite to right arm three days ago. Right side headache and pain. Initial encounter.   EXAM: MRI HEAD WITHOUT CONTRAST   TECHNIQUE: Multiplanar, multiecho pulse sequences of the brain and surrounding structures were obtained without intravenous contrast.   COMPARISON:  Head CT without contrast 1125 hours today. Brain MRI, head and neck MRA 09/14/2014   FINDINGS: Brain: No restricted diffusion to suggest acute infarction. No midline shift, mass effect, evidence of mass lesion, ventriculomegaly, extra-axial collection or acute intracranial hemorrhage. Cervicomedullary junction and pituitary are within normal limits.   Stable cerebral volume. Stable gray and white matter signal throughout the brain. Occasional nonspecific subcortical and periventricular white matter T2 and FLAIR hyperintensity has not changed since 2016. No cortical encephalomalacia or chronic cerebral blood products. Deep gray matter nuclei, brainstem, and cerebellum remain normal.   Vascular: Major intracranial vascular flow voids are stable.   Skull and upper cervical spine: Negative. Visible bone marrow signal is within normal limits.   Sinuses/Orbits: Stable and negative.   Other: Visible internal auditory structures appear normal. Negative scalp soft tissues.   IMPRESSION: No acute intracranial abnormality. Stable since 2016 and largely unremarkable for age noncontrast MRI appearance of the brain.     Electronically Signed   By: Genevie Ann M.D.   On: 01/26/2017 15:30

## 2017-01-26 NOTE — ED Notes (Signed)
ED Provider at bedside. 

## 2017-01-26 NOTE — ED Provider Notes (Signed)
[facility] DEPT [facility] Provider Note   CSN: [id number] Arrival date & time: 01/26/17  1030     History   Chief Complaint Chief Complaint  Patient presents with  . Wound Check    HPI Whitney Daniel is a 61 y.o. female.  The history is provided by the patient. No language interpreter was used.  Wound Check     Whitney Daniel is a 61 y.o. female who presents to the Emergency Department complaining of wound check.  She awoke at 4 AM Monday morning noticing 3 small, itchy lesions. She had one on her right back into in her right AC fossa. She notes that over the last several days her itching has progressed. On Tuesday she noted that she had generalized body aches have been progressing throughout the week. No reports of fevers. She has numbness to the right side of her face, arm, leg and feels slightly weaker than usual on the right side. She has associated right-sided headache. She has nausea but no vomiting. No abdominal pain, dysuria.  Past Medical History:  Diagnosis Date  . Anemia   . Cancer ([place])   . Hypertension   . Hypothyroidism   . Osteoarthritis   . SVD (spontaneous vaginal delivery)    x 5    Patient Active Problem List   Diagnosis Date Noted  . Greater trochanteric bursitis of right hip 10/02/2016  . 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 Right lumbar radiculitis 07/05/2015  . 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 Current tear knee, medial meniscus 04/30/2015  . History of sarcoma of soft tissue 04/27/2015  . Arthritis of knee, degenerative 03/05/2015  . Paresthesias 10/27/2014  . 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 COLONOSCOPY    . DILATATION & CURRETTAGE/HYSTEROSCOPY WITH RESECTOCOPE N/A 07/15/2014   Procedure: DILATATION &  CURETTAGE/HYSTEROSCOPY WITH RESECTOCOPE;  Surgeon: Marvene Staff, MD;  Location: [facility];  Service: Gynecology;  Laterality: N/A;  . DILATION AND CURETTAGE OF UTERUS     polyps  . right thigh surgery     cancer  . WISDOM TOOTH EXTRACTION      OB History    No data available       Home Medications    Prior to Admission medications   Medication Sig Start Date End Date Taking? Authorizing Provider  albuterol (PROVENTIL HFA;VENTOLIN HFA) 108 (90 BASE) MCG/ACT inhaler Inhale 2 puffs into the lungs every 6 (six) hours as needed for wheezing.    [provider]  Biotin (RA BIOTIN) 2.5 MG CAPS Take by mouth.    [provider]  cholecalciferol (VITAMIN D) 1000 UNITS tablet Take 1,000 Units by mouth daily. Reported on 01/18/2016    [provider]  hydrochlorothiazide (HYDRODIURIL) 25 MG tablet Take 25 mg by mouth daily.    [provider]  levothyroxine (SYNTHROID, LEVOTHROID) 88 MCG tablet Take 88 mcg by mouth daily before breakfast.    [provider]  Multiple Vitamin (MULTIVITAMIN WITH MINERALS) TABS Take 1 tablet by mouth daily.    [provider]  naproxen (NAPROSYN) 250 MG tablet Take 250 mg by mouth.    [provider]  ondansetron (ZOFRAN ODT) 4 MG disintegrating tablet Take 1 tablet (4 mg total) by mouth every 8 (eight) hours as needed for nausea or vomiting. 03/19/15  Gareth Morgan, MD  pregabalin (LYRICA) 75 MG capsule Take 1 capsule (75 mg total) by mouth 3 (three) times daily. 10/16/16   Kirsteins, Luanna Salk, MD  tapentadol (NUCYNTA) 50 MG tablet Take 1 tablet (50 mg total) by mouth 2 (two) times daily. Patient not taking: Reported on 10/02/2016 05/30/16   Charlett Blake, MD  traMADol (ULTRAM) 50 MG tablet Take 1 tablet (50 mg total) by mouth 3 (three) times daily. 12/22/16   Kirsteins, Luanna Salk, MD    Family History Family History  Problem Relation Age of Onset  . Hypertension Mother   . CVA Mother   .  Cancer Father   . Diabetes Father   . Diabetes Sister   . Heart attack Sister     Social History Social History  Substance Use Topics  . Smoking status: Never Smoker  . Smokeless tobacco: Never Used  . Alcohol use 0.0 oz/week     Comment: social - wine     Allergies   Codeine and Vicodin [hydrocodone-acetaminophen]   Review of Systems Review of Systems  All other systems reviewed and are negative.    Physical Exam Updated Vital Signs BP (!) 152/88   Pulse 63   Temp 99.9 F (37.7 C) (Oral)   Resp 18   Ht 5' 8.5" (1.74 m)   Wt 84.4 kg (186 lb)   SpO2 100%   BMI 27.87 kg/m   Physical Exam  Constitutional: She is oriented to person, place, and time. She appears well-developed and well-nourished.  HENT:  Head: Normocephalic and atraumatic.  Cardiovascular: Normal rate and regular rhythm.   No murmur heard. Pulmonary/Chest: Effort normal and breath sounds normal. No respiratory distress.  Abdominal: Soft. There is no tenderness. There is no rebound and no guarding.  Musculoskeletal: She exhibits no edema or tenderness.  2 lesions in the right before meals fossa with central, minimal ecchymosis and surrounding mild erythema. No induration or palpable abscess. One lesion to the right mid back with minimal central ecchymosis and surrounding erythema.  Neurological: She is alert and oriented to person, place, and time.  5 strength in all 4 extremities with sensation to light touch intact in all four extremities. Sensation is altered throughout the right arm and right leg. No facial asymmetry. No pronator drift.  Skin: Skin is warm and dry.  Psychiatric: She has a normal mood and affect. Her behavior is normal.  Nursing note and vitals reviewed.    ED Treatments / Results  Labs (all labs ordered are listed, but only abnormal results are displayed) Labs Reviewed  COMPREHENSIVE METABOLIC PANEL  CBC WITH DIFFERENTIAL/PLATELET  URINALYSIS, ROUTINE W REFLEX MICROSCOPIC      EKG  EKG Interpretation None       Radiology No results found.  Procedures Procedures (including critical care time)  Medications Ordered in ED Medications - No data to display   Initial Impression / Assessment and Plan / ED Course  I have reviewed the triage vital signs and the nursing notes.  Pertinent labs & imaging results that were available during my care of the patient were reviewed by me and considered in my medical decision making (see chart for details).     Patient here for evaluation of body aches, right hemibody numbness. She does have 3 lesions on her arm and flank. The lesions are consistent with insect bites. She is neurologically intact on examination except for decreased sensation to light touch throughout right face, arm, leg. Current presentation is not  consistent with CVA. CT head is negative for acute process. Given her body aches and recent insect bite, question possible tick exposure will treat with doxycycline 1. Discussed with patient if she chooses not to take the acyclovir she should see her PCP to have titers drawn to see if she has been exposed to tilt the OR and illness. Plan to transfer to [facility] for further imaging of her brain to evaluate her paresthesias. Discussed with Dr. Tamera Punt who excepts the patient in transfer.  Final Clinical Impressions(s) / ED Diagnoses   Final diagnoses:  Paresthesia  Insect bite, initial encounter  Body aches    New Prescriptions New Prescriptions   No medications on file     Quintella Reichert, MD 01/26/17 1645

## 2017-01-26 NOTE — ED Notes (Signed)
Report to Corene Cornea, RN at [facility] ED

## 2017-01-26 NOTE — ED Triage Notes (Signed)
Pt reports bug bite to right arm on Tuesday. States she is having right side headache and right side pain

## 2017-01-26 NOTE — ED Notes (Signed)
Pt in MRI.

## 2017-02-02 ENCOUNTER — Ambulatory Visit (HOSPITAL_BASED_OUTPATIENT_CLINIC_OR_DEPARTMENT_OTHER): Payer: BLUE CROSS/BLUE SHIELD | Admitting: Physical Medicine & Rehabilitation

## 2017-02-02 ENCOUNTER — Encounter: Payer: Self-pay | Admitting: Physical Medicine & Rehabilitation

## 2017-02-02 ENCOUNTER — Encounter: Payer: BLUE CROSS/BLUE SHIELD | Attending: Physical Medicine & Rehabilitation

## 2017-02-02 VITALS — BP 164/80 | HR 72 | Resp 14

## 2017-02-02 DIAGNOSIS — M792 Neuralgia and neuritis, unspecified: Secondary | ICD-10-CM

## 2017-02-02 DIAGNOSIS — M7062 Trochanteric bursitis, left hip: Secondary | ICD-10-CM | POA: Diagnosis not present

## 2017-02-02 DIAGNOSIS — Z9889 Other specified postprocedural states: Secondary | ICD-10-CM | POA: Insufficient documentation

## 2017-02-02 DIAGNOSIS — M7061 Trochanteric bursitis, right hip: Secondary | ICD-10-CM

## 2017-02-02 DIAGNOSIS — M5136 Other intervertebral disc degeneration, lumbar region: Secondary | ICD-10-CM | POA: Insufficient documentation

## 2017-02-02 DIAGNOSIS — M79651 Pain in right thigh: Secondary | ICD-10-CM | POA: Diagnosis not present

## 2017-02-02 DIAGNOSIS — M797 Fibromyalgia: Secondary | ICD-10-CM

## 2017-02-02 MED ORDER — PREGABALIN 100 MG PO CAPS: 100.0000 mg | ORAL_CAPSULE | Freq: Three times a day (TID) | ORAL | 0 refills | Status: DC

## 2017-02-02 NOTE — Progress Notes (Signed)
Subjective:    Patient ID: Whitney Daniel, female    DOB: [birth year], 61 y.o.   MRN: [id number]  HPI Pt putting off bursectomy on right side  Told by ortho that pt will need bilateral knee replacements  Achy all over, TSH ok per PCP according to pt  Taking tramadol 100mg , In the morning and then 50 in the evening. Lyrica 75mg  TID,   Feels funny from pain medicine, couldn't tolerate Nucynta  Pain Inventory Average Pain 10 Pain Right Now 5 My pain is sharp and burning  In the last 24 hours, has pain interfered with the following? General activity 9 Relation with others 7 Enjoyment of life 9 What TIME of day is your pain at its worst? all Sleep (in general) Poor  Pain is worse with: walking, bending, sitting, inactivity and standing Pain improves with: rest Relief from Meds: 0  Mobility walk without assistance ability to climb steps?  yes do you drive?  yes  Function not employed: date last employed . disabled: date disabled . I need assistance with the following:  meal prep, household duties and shopping  Neuro/Psych bowel control problems weakness numbness tingling trouble walking dizziness depression  Prior Studies Any changes since last visit?  no  Physicians involved in your care Any changes since last visit?  no   Family History  Problem Relation Age of Onset  . Hypertension Mother   . CVA Mother   . Cancer Father   . Diabetes Father   . Diabetes Sister   . Heart attack Sister    Social History   Social History  . Marital status: Married    Spouse name: N/A  . Number of children: N/A  . Years of education: N/A   Social History Main Topics  . Smoking status: Never Smoker  . Smokeless tobacco: Never Used  . Alcohol use 0.0 oz/week     Comment: social - wine  . Drug use: No  . Sexual activity: Yes    Birth control/ protection: Post-menopausal   Other Topics Concern  . None   Social History Narrative   Lives alone in a one story  home.  Has 5 children.  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    Past Surgical History:  Procedure Laterality Date  . COLONOSCOPY    . DILATATION & CURRETTAGE/HYSTEROSCOPY WITH RESECTOCOPE N/A 07/15/2014   Procedure: DILATATION & CURETTAGE/HYSTEROSCOPY WITH RESECTOCOPE;  Surgeon: Marvene Staff, MD;  Location: [facility];  Service: Gynecology;  Laterality: N/A;  . DILATION AND CURETTAGE OF UTERUS     polyps  . right thigh surgery     cancer  . WISDOM TOOTH EXTRACTION     Past Medical History:  Diagnosis Date  . Anemia   . Cancer ([place])   . Hypertension   . Hypothyroidism   . Osteoarthritis   . SVD (spontaneous vaginal delivery)    x 5   BP (!) 164/80 (BP Location: Left Arm, Patient Position: Sitting, Cuff Size: Large)   Pulse 72   Resp 14   SpO2 98%   Opioid Risk Score:   Fall Risk Score:  `1  Depression screen PHQ 2/9  Depression screen [facility] 2/9 06/04/2015 04/27/2015  Decreased Interest 3 3  Down, Depressed, Hopeless 1 1  PHQ - 2 Score 4 4  Altered sleeping - 3  Tired, decreased energy - 3  Change in appetite -  2  Feeling bad or failure about yourself  - 1  Trouble concentrating - 1  Moving slowly or fidgety/restless - 3  Suicidal thoughts - 2  PHQ-9 Score - 19  Difficult doing work/chores - Extremely dIfficult    Review of Systems  Constitutional: Positive for appetite change and diaphoresis.  Respiratory: Positive for shortness of breath.   Cardiovascular: Negative.   Gastrointestinal: Negative.   Endocrine:       High blood sugar  Genitourinary: Negative.   Musculoskeletal: Positive for arthralgias, back pain, gait problem, myalgias and neck pain.  Skin: Negative.   Neurological: Positive for dizziness, weakness and numbness.       Tingling  Psychiatric/Behavioral: Positive for dysphoric mood.  All other systems reviewed and are negative.      Objective:    Physical Exam  Constitutional: She is oriented to person, place, and time. She appears well-developed and well-nourished.  HENT:  Head: Normocephalic and atraumatic.  Eyes: Conjunctivae and EOM are normal. Pupils are equal, round, and reactive to light.  Musculoskeletal:       Right hip: She exhibits bony tenderness. She exhibits normal range of motion.       Left hip: She exhibits bony tenderness. She exhibits normal range of motion.       Right knee: She exhibits normal range of motion. No tenderness found.       Left knee: She exhibits normal range of motion. No tenderness found.  17/18 fibromyalgia tender points. Positive  Neurological: She is alert and oriented to person, place, and time. She has normal strength. She displays no atrophy and no tremor. No sensory deficit. She exhibits normal muscle tone. Coordination and gait normal.  Motor strength is 5/5 bilateral deltoid, bicep, tricep, grip, hip flexor, knee extensor, ankle dorsiflexor  Psychiatric: She has a normal mood and affect. Her speech is not rapid and/or pressured and not delayed. She is not agitated and not slowed. Cognition and memory are not impaired.  Nursing note and vitals reviewed.         Assessment & Plan:  1. 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 No EMG evidence of nerve injury. No evidence of spinal nerve root compression.  Continue tramadol 50mg  3 times a day Increase Lyrica to 100 mg 3 times a day  2. Widespread body pain, patient has had prior blood work to check for arthritis, according to patient. Also has had hypothyroidism but this has been managed with supplementation. I suspect she has Fibromyalgia syndrome. As discussed with patient. We'll need to start aquatic exercise program The Lyrica and tramadol are both medications we commonly use with this problem.

## 2017-02-02 NOTE — Patient Instructions (Signed)
Please check at the [facility] for a fibromyalgia aquatic class or some other aquatic class for patients with arthritis or low impact

## 2017-02-13 DIAGNOSIS — Z136 Encounter for screening for cardiovascular disorders: Secondary | ICD-10-CM | POA: Diagnosis not present

## 2017-02-13 DIAGNOSIS — M1711 Unilateral primary osteoarthritis, right knee: Secondary | ICD-10-CM | POA: Diagnosis not present

## 2017-02-13 DIAGNOSIS — M7989 Other specified soft tissue disorders: Secondary | ICD-10-CM | POA: Diagnosis not present

## 2017-02-13 DIAGNOSIS — S83241A Other tear of medial meniscus, current injury, right knee, initial encounter: Secondary | ICD-10-CM | POA: Diagnosis not present

## 2017-02-13 DIAGNOSIS — M1712 Unilateral primary osteoarthritis, left knee: Secondary | ICD-10-CM | POA: Diagnosis not present

## 2017-02-14 DIAGNOSIS — M25561 Pain in right knee: Secondary | ICD-10-CM | POA: Diagnosis not present

## 2017-02-14 DIAGNOSIS — M1712 Unilateral primary osteoarthritis, left knee: Secondary | ICD-10-CM | POA: Diagnosis not present

## 2017-02-14 DIAGNOSIS — M25562 Pain in left knee: Secondary | ICD-10-CM | POA: Diagnosis not present

## 2017-02-14 DIAGNOSIS — G8929 Other chronic pain: Secondary | ICD-10-CM | POA: Diagnosis not present

## 2017-02-14 DIAGNOSIS — R262 Difficulty in walking, not elsewhere classified: Secondary | ICD-10-CM | POA: Diagnosis not present

## 2017-02-14 DIAGNOSIS — M1711 Unilateral primary osteoarthritis, right knee: Secondary | ICD-10-CM | POA: Diagnosis not present

## 2017-02-16 DIAGNOSIS — M898X5 Other specified disorders of bone, thigh: Secondary | ICD-10-CM | POA: Diagnosis not present

## 2017-02-16 DIAGNOSIS — M25562 Pain in left knee: Secondary | ICD-10-CM | POA: Diagnosis not present

## 2017-02-19 DIAGNOSIS — M7989 Other specified soft tissue disorders: Secondary | ICD-10-CM | POA: Diagnosis not present

## 2017-02-19 DIAGNOSIS — M79662 Pain in left lower leg: Secondary | ICD-10-CM | POA: Diagnosis not present

## 2017-02-19 DIAGNOSIS — R600 Localized edema: Secondary | ICD-10-CM | POA: Diagnosis not present

## 2017-02-22 DIAGNOSIS — R600 Localized edema: Secondary | ICD-10-CM | POA: Diagnosis not present

## 2017-02-23 ENCOUNTER — Telehealth: Payer: Self-pay | Admitting: *Deleted

## 2017-02-23 DIAGNOSIS — R600 Localized edema: Secondary | ICD-10-CM | POA: Diagnosis not present

## 2017-02-23 DIAGNOSIS — M170 Bilateral primary osteoarthritis of knee: Secondary | ICD-10-CM

## 2017-02-23 NOTE — Telephone Encounter (Signed)
Whitney Daniel would like a rollator walker.  She is asking Dr Letta Pate for an order so insurance will cover the walker. Please advise.

## 2017-02-23 NOTE — Telephone Encounter (Signed)
The only indication. I would see is for her knee problems, she does see orthopedics for that. I do not have any knee x-rays. If I am to order this. I would need to get knee x-rays first. She can call her orthopedic doctor and see whether they can order this

## 2017-02-26 NOTE — Telephone Encounter (Signed)
Will need knee x-rays, we will review and make further recommendations based on these

## 2017-02-26 NOTE — Telephone Encounter (Signed)
Whitney Daniel is quite insistant that there is much more than her knees that cause her to need the rollator walker. She says if you need her to get knee xrays she is willing.  She would like to speak with you about it and her appt is not until 03/20/17.  Her number is [phone]

## 2017-02-27 ENCOUNTER — Telehealth: Payer: Self-pay | Admitting: *Deleted

## 2017-02-27 NOTE — Telephone Encounter (Signed)
Whitney Daniel called back after she spoke with her orthopedist. They have agreed to write the Rx for the rollator walker.

## 2017-02-27 NOTE — Telephone Encounter (Signed)
I placed order for x rays but when speaking with Ticia she now says she has had them recently.  She will get the reports faxed to our office(fax number given).  She wanted to discuss back but I have told her that will need to be adressed at the next appt with Dr Letta Pate in July. X rays cancelled.

## 2017-03-01 DIAGNOSIS — Z6829 Body mass index (BMI) 29.0-29.9, adult: Secondary | ICD-10-CM | POA: Diagnosis not present

## 2017-03-01 DIAGNOSIS — Z713 Dietary counseling and surveillance: Secondary | ICD-10-CM | POA: Diagnosis not present

## 2017-03-02 DIAGNOSIS — M25852 Other specified joint disorders, left hip: Secondary | ICD-10-CM | POA: Diagnosis not present

## 2017-03-02 DIAGNOSIS — M76892 Other specified enthesopathies of left lower limb, excluding foot: Secondary | ICD-10-CM | POA: Diagnosis not present

## 2017-03-02 DIAGNOSIS — M76899 Other specified enthesopathies of unspecified lower limb, excluding foot: Secondary | ICD-10-CM | POA: Diagnosis not present

## 2017-03-02 DIAGNOSIS — M67952 Unspecified disorder of synovium and tendon, left thigh: Secondary | ICD-10-CM | POA: Diagnosis not present

## 2017-03-04 DIAGNOSIS — M76892 Other specified enthesopathies of left lower limb, excluding foot: Secondary | ICD-10-CM | POA: Diagnosis not present

## 2017-03-04 DIAGNOSIS — M67952 Unspecified disorder of synovium and tendon, left thigh: Secondary | ICD-10-CM | POA: Diagnosis not present

## 2017-03-04 DIAGNOSIS — M25852 Other specified joint disorders, left hip: Secondary | ICD-10-CM | POA: Diagnosis not present

## 2017-03-13 ENCOUNTER — Ambulatory Visit: Payer: BLUE CROSS/BLUE SHIELD | Admitting: Physical Medicine & Rehabilitation

## 2017-03-13 DIAGNOSIS — R600 Localized edema: Secondary | ICD-10-CM | POA: Diagnosis not present

## 2017-03-13 DIAGNOSIS — G5792 Unspecified mononeuropathy of left lower limb: Secondary | ICD-10-CM | POA: Diagnosis not present

## 2017-03-13 DIAGNOSIS — M79662 Pain in left lower leg: Secondary | ICD-10-CM | POA: Diagnosis not present

## 2017-03-13 DIAGNOSIS — I83892 Varicose veins of left lower extremities with other complications: Secondary | ICD-10-CM | POA: Diagnosis not present

## 2017-03-20 ENCOUNTER — Ambulatory Visit (HOSPITAL_BASED_OUTPATIENT_CLINIC_OR_DEPARTMENT_OTHER): Payer: BLUE CROSS/BLUE SHIELD | Admitting: Physical Medicine & Rehabilitation

## 2017-03-20 ENCOUNTER — Encounter: Payer: Self-pay | Admitting: Physical Medicine & Rehabilitation

## 2017-03-20 ENCOUNTER — Telehealth: Payer: Self-pay | Admitting: Physical Medicine & Rehabilitation

## 2017-03-20 ENCOUNTER — Ambulatory Visit
Admission: RE | Admit: 2017-03-20 | Discharge: 2017-03-20 | Disposition: A | Discharge status: Home / Self Care | Payer: BLUE CROSS/BLUE SHIELD | Source: Ambulatory Visit | Attending: Physical Medicine & Rehabilitation | Admitting: Physical Medicine & Rehabilitation

## 2017-03-20 ENCOUNTER — Encounter: Payer: BLUE CROSS/BLUE SHIELD | Attending: Physical Medicine & Rehabilitation

## 2017-03-20 VITALS — BP 157/96 | HR 68 | Resp 14

## 2017-03-20 DIAGNOSIS — M170 Bilateral primary osteoarthritis of knee: Secondary | ICD-10-CM

## 2017-03-20 DIAGNOSIS — M7062 Trochanteric bursitis, left hip: Secondary | ICD-10-CM | POA: Diagnosis not present

## 2017-03-20 DIAGNOSIS — Z9889 Other specified postprocedural states: Secondary | ICD-10-CM | POA: Diagnosis not present

## 2017-03-20 DIAGNOSIS — M79651 Pain in right thigh: Secondary | ICD-10-CM | POA: Diagnosis not present

## 2017-03-20 DIAGNOSIS — M7061 Trochanteric bursitis, right hip: Secondary | ICD-10-CM | POA: Diagnosis not present

## 2017-03-20 DIAGNOSIS — M797 Fibromyalgia: Secondary | ICD-10-CM

## 2017-03-20 DIAGNOSIS — M5136 Other intervertebral disc degeneration, lumbar region: Secondary | ICD-10-CM | POA: Insufficient documentation

## 2017-03-20 DIAGNOSIS — M1712 Unilateral primary osteoarthritis, left knee: Secondary | ICD-10-CM | POA: Diagnosis not present

## 2017-03-20 DIAGNOSIS — M1711 Unilateral primary osteoarthritis, right knee: Secondary | ICD-10-CM | POA: Diagnosis not present

## 2017-03-20 MED ORDER — TRAMADOL HCL 50 MG PO TABS: 50.0000 mg | ORAL_TABLET | Freq: Three times a day (TID) | ORAL | 5 refills | Status: DC

## 2017-03-20 MED ORDER — PREGABALIN 100 MG PO CAPS: 100.0000 mg | ORAL_CAPSULE | Freq: Three times a day (TID) | ORAL | 5 refills | Status: DC

## 2017-03-20 NOTE — Progress Notes (Signed)
Subjective:    Patient ID: Whitney Daniel, female    DOB: 1955/10/02, 61 y.o.   MRN: [id number]  HPI Orthopedic MD at [facility] sent pt to a therapist  Has had vascular surgery evaluation for LLE swelling  Widespread body pain responds partially to medication currently on  Lyrica 100mg  TID Tramadol 50mg  TID  Has had several ESI As well as medial branch blocks, which were not helpful in relieving pain in her right-sided low back and buttock area. Pain Inventory Average Pain 7 Pain Right Now 7 My pain is constant, sharp, burning, dull, stabbing, tingling and aching  In the last 24 hours, has pain interfered with the following? General activity 8 Relation with others 8 Enjoyment of life 8 What TIME of day is your pain at its worst? all Sleep (in general) Poor  Pain is worse with: walking, sitting, inactivity, standing and some activites Pain improves with: rest, heat/ice and medication Relief from Meds: 2  Mobility walk without assistance walk with assistance use a cane use a walker ability to climb steps?  yes use a wheelchair transfers alone Do you have any goals in this area?  yes  Function not employed: date last employed . disabled: date disabled . I need assistance with the following:  dressing and bathing Do you have any goals in this area?  yes  Neuro/Psych weakness tremor trouble walking confusion anxiety  Prior Studies Any changes since last visit?  no  Physicians involved in your care Any changes since last visit?  no   Family History  Problem Relation Age of Onset  . Hypertension Mother   . CVA Mother   . Cancer Father   . Diabetes Father   . Diabetes Sister   . Heart attack Sister    Social History   Social History  . Marital status: Married    Spouse name: N/A  . Number of children: N/A  . Years of education: N/A   Social History Main Topics  . Smoking status: Never Smoker  . Smokeless tobacco: Never Used  . Alcohol use 0.0  oz/week     Comment: social - wine  . Drug use: No  . Sexual activity: Yes    Birth control/ protection: Post-menopausal   Other Topics Concern  . None   Social History Narrative   Lives alone in a one story home.  Has 5 children.  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    Past Surgical History:  Procedure Laterality Date  . COLONOSCOPY    . DILATATION & CURRETTAGE/HYSTEROSCOPY WITH RESECTOCOPE N/A 07/15/2014   Procedure: DILATATION & CURETTAGE/HYSTEROSCOPY WITH RESECTOCOPE;  Surgeon: Marvene Staff, MD;  Location: [facility];  Service: Gynecology;  Laterality: N/A;  . DILATION AND CURETTAGE OF UTERUS     polyps  . right thigh surgery     cancer  . WISDOM TOOTH EXTRACTION     Past Medical History:  Diagnosis Date  . Anemia   . Cancer ([place])   . Hypertension   . Hypothyroidism   . Osteoarthritis   . SVD (spontaneous vaginal delivery)    x 5   BP (!) 157/96 (BP Location: Right Arm, Patient Position: Sitting, Cuff Size: Normal)   Pulse 68   Resp 14   SpO2 98%   Opioid Risk Score:   Fall Risk Score:  `1  Depression screen PHQ 2/9  Depression screen PHQ  2/9 06/04/2015 04/27/2015  Decreased Interest 3 3  Down, Depressed, Hopeless 1 1  PHQ - 2 Score 4 4  Altered sleeping - 3  Tired, decreased energy - 3  Change in appetite - 2  Feeling bad or failure about yourself  - 1  Trouble concentrating - 1  Moving slowly or fidgety/restless - 3  Suicidal thoughts - 2  PHQ-9 Score - 19  Difficult doing work/chores - Extremely dIfficult     Review of Systems  HENT: Negative.   Eyes: Negative.   Respiratory: Positive for cough.   Cardiovascular: Positive for leg swelling.  Gastrointestinal: Negative.   Endocrine: Negative.   Genitourinary: Negative.   Musculoskeletal: Positive for arthralgias, back pain, gait problem and myalgias.  Skin: Negative.   Allergic/Immunologic:  Negative.   Neurological: Positive for tremors and weakness.  Hematological: Negative.   Psychiatric/Behavioral: Positive for confusion. The patient is nervous/anxious.   All other systems reviewed and are negative.      Objective:   Physical Exam  Constitutional: She is oriented to person, place, and time. She appears well-developed and well-nourished.  HENT:  Head: Normocephalic and atraumatic.  Eyes: Pupils are equal, round, and reactive to light. Conjunctivae and EOM are normal.  Musculoskeletal:       Right knee: She exhibits normal range of motion, no swelling, no deformity and normal alignment.       Left knee: She exhibits normal range of motion, no deformity and normal alignment. Tenderness found. Medial joint line tenderness noted. No patellar tendon tenderness noted.       Lumbar back: She exhibits decreased range of motion and tenderness. She exhibits no deformity.  Pain with lumbar extension. No pain with lumbar flexion. There is pain with lateral bending bilaterally.  Neurological: She is alert and oriented to person, place, and time.  Motor strength is 5/5 bilateral hip flexion, extension, ankle dorsiflexion. Some pain inhibition. The left side with knee extension.  Psychiatric: She has a normal mood and affect.  Nursing note and vitals reviewed.         Assessment & Plan:  1. 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 She has no neurologic deficits at this time. We discussed follow-up MRI which performed last year. No-signs of recurrent tumor. No change in her symptomatology. We'll continue current pain medications  2. Chronic knee pain has been seeing orthopedics. She is concerned that she would like to try to avoid surgery. She would like to try injections if possible. We discussed need for x-rays to further assess  Patient states her sister had good relief from Synvisc injections. She  would like to know whether she is a candidate. We'll check bilateral knee x-rays. If bone-on-bone,we discussed that this would not be helpful treatment option.  X-rays performed, shows medial joint space narrowing graded as moderate. There is osteophyte formation, medial femoral condyle. We'll schedule for Synvisc injections

## 2017-03-20 NOTE — Telephone Encounter (Signed)
Phoned patient to notify her of the Synvisc injection, and pt stated she is having hip surgery on or about 04/05/2017. Pt stated she would like to have the injection before she has her surgery. Please advise.

## 2017-03-20 NOTE — Telephone Encounter (Signed)
Okay with me. If this can be scheduled

## 2017-03-21 NOTE — Telephone Encounter (Signed)
Patient has been scheduled for US Guided Synvisc injection for left Knee.

## 2017-03-27 DIAGNOSIS — R600 Localized edema: Secondary | ICD-10-CM | POA: Diagnosis not present

## 2017-03-27 DIAGNOSIS — I83892 Varicose veins of left lower extremities with other complications: Secondary | ICD-10-CM | POA: Diagnosis not present

## 2017-03-28 DIAGNOSIS — F4323 Adjustment disorder with mixed anxiety and depressed mood: Secondary | ICD-10-CM | POA: Diagnosis not present

## 2017-03-30 ENCOUNTER — Ambulatory Visit: Payer: 59 | Admitting: Physical Medicine & Rehabilitation

## 2017-04-05 ENCOUNTER — Ambulatory Visit: Payer: BLUE CROSS/BLUE SHIELD | Admitting: Physical Medicine & Rehabilitation

## 2017-04-12 ENCOUNTER — Telehealth: Payer: Self-pay | Admitting: *Deleted

## 2017-04-12 NOTE — Telephone Encounter (Signed)
Spoke with the patient regarding Dr. Rolanda Jay no longer with the practice. Patient stated she was aware and had received the letter. Informed patient that we cancelled her appts. Often patient assistance with rescheduling to other provider in the area, patient stated she will call on her own.

## 2017-04-25 DIAGNOSIS — R262 Difficulty in walking, not elsewhere classified: Secondary | ICD-10-CM | POA: Diagnosis not present

## 2017-04-25 DIAGNOSIS — M1711 Unilateral primary osteoarthritis, right knee: Secondary | ICD-10-CM | POA: Diagnosis not present

## 2017-04-25 DIAGNOSIS — M1712 Unilateral primary osteoarthritis, left knee: Secondary | ICD-10-CM | POA: Diagnosis not present

## 2017-04-25 DIAGNOSIS — M25562 Pain in left knee: Secondary | ICD-10-CM | POA: Diagnosis not present

## 2017-04-25 DIAGNOSIS — G8929 Other chronic pain: Secondary | ICD-10-CM | POA: Diagnosis not present

## 2017-04-25 DIAGNOSIS — M25561 Pain in right knee: Secondary | ICD-10-CM | POA: Diagnosis not present

## 2017-05-17 ENCOUNTER — Ambulatory Visit (HOSPITAL_COMMUNITY): Payer: BLUE CROSS/BLUE SHIELD

## 2017-05-22 ENCOUNTER — Ambulatory Visit: Payer: 59 | Admitting: General Surgery

## 2017-05-30 ENCOUNTER — Telehealth: Payer: Self-pay | Admitting: *Deleted

## 2017-05-30 NOTE — Telephone Encounter (Signed)
Whitney Daniel called and she has applied for disability and SS needs to know who to send the forms to.  Will you be willing to sign forms? Pleas advise.

## 2017-05-31 NOTE — Telephone Encounter (Signed)
I do not sign disability forms for Social Security. I can supply my medical records. If there is a written request from the patient

## 2017-05-31 NOTE — Telephone Encounter (Signed)
Ms Rising notified.

## 2017-06-06 DIAGNOSIS — M25561 Pain in right knee: Secondary | ICD-10-CM | POA: Diagnosis not present

## 2017-06-06 DIAGNOSIS — M1711 Unilateral primary osteoarthritis, right knee: Secondary | ICD-10-CM | POA: Diagnosis not present

## 2017-06-06 DIAGNOSIS — G8929 Other chronic pain: Secondary | ICD-10-CM | POA: Diagnosis not present

## 2017-06-06 DIAGNOSIS — M5442 Lumbago with sciatica, left side: Secondary | ICD-10-CM | POA: Diagnosis not present

## 2017-06-06 DIAGNOSIS — M25562 Pain in left knee: Secondary | ICD-10-CM | POA: Diagnosis not present

## 2017-06-06 DIAGNOSIS — M1712 Unilateral primary osteoarthritis, left knee: Secondary | ICD-10-CM | POA: Diagnosis not present

## 2017-06-06 DIAGNOSIS — M5441 Lumbago with sciatica, right side: Secondary | ICD-10-CM | POA: Diagnosis not present

## 2017-06-06 DIAGNOSIS — F4323 Adjustment disorder with mixed anxiety and depressed mood: Secondary | ICD-10-CM | POA: Diagnosis not present

## 2017-06-06 DIAGNOSIS — R262 Difficulty in walking, not elsewhere classified: Secondary | ICD-10-CM | POA: Diagnosis not present

## 2017-06-08 DIAGNOSIS — M47816 Spondylosis without myelopathy or radiculopathy, lumbar region: Secondary | ICD-10-CM | POA: Diagnosis not present

## 2017-06-08 DIAGNOSIS — G8929 Other chronic pain: Secondary | ICD-10-CM | POA: Diagnosis not present

## 2017-06-08 DIAGNOSIS — M5442 Lumbago with sciatica, left side: Secondary | ICD-10-CM | POA: Diagnosis not present

## 2017-06-08 DIAGNOSIS — M5441 Lumbago with sciatica, right side: Secondary | ICD-10-CM | POA: Diagnosis not present

## 2017-06-08 DIAGNOSIS — M48061 Spinal stenosis, lumbar region without neurogenic claudication: Secondary | ICD-10-CM | POA: Diagnosis not present

## 2017-06-12 DIAGNOSIS — M48061 Spinal stenosis, lumbar region without neurogenic claudication: Secondary | ICD-10-CM | POA: Diagnosis not present

## 2017-06-12 DIAGNOSIS — M5442 Lumbago with sciatica, left side: Secondary | ICD-10-CM | POA: Diagnosis not present

## 2017-06-12 DIAGNOSIS — M4316 Spondylolisthesis, lumbar region: Secondary | ICD-10-CM | POA: Diagnosis not present

## 2017-06-12 DIAGNOSIS — G8929 Other chronic pain: Secondary | ICD-10-CM | POA: Diagnosis not present

## 2017-06-12 DIAGNOSIS — M5136 Other intervertebral disc degeneration, lumbar region: Secondary | ICD-10-CM | POA: Diagnosis not present

## 2017-06-12 DIAGNOSIS — M5441 Lumbago with sciatica, right side: Secondary | ICD-10-CM | POA: Diagnosis not present

## 2017-06-21 DIAGNOSIS — C499 Malignant neoplasm of connective and soft tissue, unspecified: Secondary | ICD-10-CM | POA: Diagnosis not present

## 2017-06-21 DIAGNOSIS — M25552 Pain in left hip: Secondary | ICD-10-CM | POA: Diagnosis not present

## 2017-06-21 DIAGNOSIS — Z85831 Personal history of malignant neoplasm of soft tissue: Secondary | ICD-10-CM | POA: Diagnosis not present

## 2017-06-21 DIAGNOSIS — M79651 Pain in right thigh: Secondary | ICD-10-CM | POA: Diagnosis not present

## 2017-06-21 DIAGNOSIS — M6281 Muscle weakness (generalized): Secondary | ICD-10-CM | POA: Diagnosis not present

## 2017-06-21 DIAGNOSIS — D481 Neoplasm of uncertain behavior of connective and other soft tissue: Secondary | ICD-10-CM | POA: Diagnosis not present

## 2017-07-02 ENCOUNTER — Ambulatory Visit (HOSPITAL_BASED_OUTPATIENT_CLINIC_OR_DEPARTMENT_OTHER): Payer: BLUE CROSS/BLUE SHIELD | Admitting: Physical Medicine & Rehabilitation

## 2017-07-02 ENCOUNTER — Encounter: Payer: Self-pay | Admitting: Physical Medicine & Rehabilitation

## 2017-07-02 ENCOUNTER — Encounter: Payer: BLUE CROSS/BLUE SHIELD | Attending: Physical Medicine & Rehabilitation

## 2017-07-02 VITALS — BP 129/69 | HR 68

## 2017-07-02 DIAGNOSIS — I10 Essential (primary) hypertension: Secondary | ICD-10-CM | POA: Diagnosis not present

## 2017-07-02 DIAGNOSIS — M7918 Myalgia, other site: Secondary | ICD-10-CM | POA: Diagnosis not present

## 2017-07-02 DIAGNOSIS — M47816 Spondylosis without myelopathy or radiculopathy, lumbar region: Secondary | ICD-10-CM

## 2017-07-02 DIAGNOSIS — M545 Low back pain: Secondary | ICD-10-CM | POA: Insufficient documentation

## 2017-07-02 DIAGNOSIS — M7061 Trochanteric bursitis, right hip: Secondary | ICD-10-CM | POA: Diagnosis not present

## 2017-07-02 DIAGNOSIS — Z85831 Personal history of malignant neoplasm of soft tissue: Secondary | ICD-10-CM | POA: Diagnosis not present

## 2017-07-02 DIAGNOSIS — G8929 Other chronic pain: Secondary | ICD-10-CM | POA: Diagnosis not present

## 2017-07-02 DIAGNOSIS — C4921 Malignant neoplasm of connective and soft tissue of right lower limb, including hip: Secondary | ICD-10-CM | POA: Diagnosis not present

## 2017-07-02 DIAGNOSIS — Z833 Family history of diabetes mellitus: Secondary | ICD-10-CM | POA: Diagnosis not present

## 2017-07-02 DIAGNOSIS — E039 Hypothyroidism, unspecified: Secondary | ICD-10-CM | POA: Insufficient documentation

## 2017-07-02 DIAGNOSIS — M1990 Unspecified osteoarthritis, unspecified site: Secondary | ICD-10-CM | POA: Insufficient documentation

## 2017-07-02 DIAGNOSIS — Z823 Family history of stroke: Secondary | ICD-10-CM | POA: Insufficient documentation

## 2017-07-02 DIAGNOSIS — Z8249 Family history of ischemic heart disease and other diseases of the circulatory system: Secondary | ICD-10-CM | POA: Diagnosis not present

## 2017-07-02 NOTE — Patient Instructions (Signed)
We will put you on cancellation list.  It is possible that your back pain may cause some right-sided hip pain as well.

## 2017-07-02 NOTE — Progress Notes (Signed)
Subjective:    Patient ID: Whitney Daniel, female    DOB: [birth year], 61 y.o.   MRN: [id number]  HPI   Chief complaint is right-sided low back buttock and hip pain  Patient returns today referred by [facility] to evaluate for spinal injections.  Repeat MRI in October demonstrated multiple level facet hypertrophy.  Reviewed previous injection notes, lumbar medial branch blocks L3-L4 and lumbar ramus injection L5, this produced 100% relief for 1-2 days however when radiofrequency procedure was performed on 01/06/2016 at the corresponding levels there was no significant relief. Subsequent sacroiliac injection performed 02/03/2016 helped on the left side but did not help any right-sided symptoms. I reviewed records from [facility] spine [facility].  Reviewed most recent MRI findings.  Surgical oncology followup suggested no troch bursa resection labral repair until spine injections completed Pain Inventory Average Pain 7 Pain Right Now 7 My pain is constant, sharp, dull and aching  In the last 24 hours, has pain interfered with the following? General activity 5 Relation with others 5 Enjoyment of life 5 What TIME of day is your pain at its worst? morning Sleep (in general) Poor  Pain is worse with: walking, bending and sitting Pain improves with: rest, medication and injections Relief from Meds: 1  Mobility walk without assistance walk with assistance use a walker ability to climb steps?  yes do you drive?  yes  Function disabled: date disabled 2016  Neuro/Psych weakness tremor tingling trouble walking dizziness confusion depression anxiety  Prior Studies Any changes since last visit?  no  Physicians involved in your care Any changes since last visit?  no   Family History  Problem Relation Age of Onset  . Hypertension Mother   . CVA Mother   . Cancer Father   . Diabetes Father   . Diabetes Sister   . Heart attack Sister    Social  History   Social History  . Marital status: Married    Spouse name: N/A  . Number of children: N/A  . Years of education: N/A   Social History Main Topics  . Smoking status: Never Smoker  . Smokeless tobacco: Never Used  . Alcohol use 0.0 oz/week     Comment: social - wine  . Drug use: No  . Sexual activity: Yes    Birth control/ protection: Post-menopausal   Other Topics Concern  . Not on file   Social History Narrative   Lives alone in a one story home.  Has 5 children.  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    Past Surgical History:  Procedure Laterality Date  . COLONOSCOPY    . DILATATION & CURRETTAGE/HYSTEROSCOPY WITH RESECTOCOPE N/A 07/15/2014   Procedure: DILATATION & CURETTAGE/HYSTEROSCOPY WITH RESECTOCOPE;  Surgeon: Marvene Staff, MD;  Location: [facility];  Service: Gynecology;  Laterality: N/A;  . DILATION AND CURETTAGE OF UTERUS     polyps  . right thigh surgery     cancer  . WISDOM TOOTH EXTRACTION     Past Medical History:  Diagnosis Date  . Anemia   . Cancer ([place])   . Hypertension   . Hypothyroidism   . Osteoarthritis   . SVD (spontaneous vaginal delivery)    x 5   There were no vitals taken for this visit.  Opioid Risk Score:   Fall Risk Score:  `1  Depression screen [facility] 2/9  Depression screen [facility] 2/9 06/04/2015 04/27/2015  Decreased Interest 3 3  Down, Depressed, Hopeless 1 1  PHQ - 2 Score 4 4  Altered sleeping - 3  Tired, decreased energy - 3  Change in appetite - 2  Feeling bad or failure about yourself  - 1  Trouble concentrating - 1  Moving slowly or fidgety/restless - 3  Suicidal thoughts - 2  PHQ-9 Score - 19  Difficult doing work/chores - Extremely dIfficult     Review of Systems  Constitutional: Negative.   HENT: Negative.   Eyes: Negative.   Respiratory: Positive for cough, shortness of breath and wheezing.     Cardiovascular: Negative.   Gastrointestinal: Negative.   Endocrine: Negative.   Genitourinary: Negative.   Musculoskeletal: Negative.   Skin: Negative.   Allergic/Immunologic: Negative.   Neurological: Negative.   Hematological: Negative.   Psychiatric/Behavioral: Negative.   All other systems reviewed and are negative.      Objective:   Physical Exam  Constitutional: She is oriented to person, place, and time. She appears well-developed and well-nourished. No distress.  HENT:  Head: Normocephalic and atraumatic.  Eyes: Pupils are equal, round, and reactive to light. Conjunctivae and EOM are normal.  Neck: Normal range of motion.  Musculoskeletal:       Lumbar back: She exhibits decreased range of motion, tenderness and deformity.  Patient has reduced range of motion in the lumbosacral area, most of her forward flexion is limited to the upper lumbar area.  Overall percent of normal motion with lumbar flexion lumbar extension and lateral bending as well as twisting. Mild list to palpation L4 L5-S1 paraspinal area as well as in the gluteus medius area and over the right greater trochanter.  Right hip range of motion mildly diminished with internal and external rotation accompanied by lateral hip pain but no groin pain.  Neurological: She is alert and oriented to person, place, and time. No sensory deficit. She exhibits normal muscle tone. Gait abnormal. Coordination normal.  Negative straight leg raising  Forward flexed posture   Skin: She is not diaphoretic.  Psychiatric: She has a normal mood and affect. Her behavior is normal. Judgment and thought content normal.  Nursing note and vitals reviewed.  Patient has no pain above L4 area all of her pain is below L4 and radiates into the right buttock as well as laterally towards the trochanteric bursa. Left        Assessment & Plan:  1.  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    We discussed myofascial pain as a potential pain generator, trial trigger point injections today   We discussed the option of repeating medial branch blocks to confirm 50-100% pain relief at L3-L4 medial branches and L5 dorsal ramus on the right side. If she does have a good temporary relief repeat lumbar RFA of the corresponding levels with larger gauge needle electrode   Trigger Point Injection  Indication: Right lumbar myofascial pain not relieved by medication management and other conservative care.  Informed consent was obtained after describing risk and benefits of the procedure with the patient, this includes bleeding, bruising, infection and medication side effects.  The patient wishes to proceed and has given written consent.  The patient was placed in a  prone position.  The right L4 L5-S1 paraspinal area was marked and prepped with Betadine.  It was entered with a 25-gauge 1-1/2 inch needle and 1 mL of 1% lidocaine was injected into each of 3 trigger points, after negative draw back for blood.  The patient tolerated the procedure well.  Post procedure instructions were given.  Post trigger point injection patient stated that she still has low back and buttock pain that is "deeper" than injected area

## 2017-07-03 ENCOUNTER — Encounter (HOSPITAL_BASED_OUTPATIENT_CLINIC_OR_DEPARTMENT_OTHER): Payer: Self-pay | Admitting: Emergency Medicine

## 2017-07-03 ENCOUNTER — Emergency Department (HOSPITAL_BASED_OUTPATIENT_CLINIC_OR_DEPARTMENT_OTHER)
Admission: EM | Admit: 2017-07-03 | Discharge: 2017-07-03 | Disposition: A | Discharge status: Home / Self Care | Payer: BLUE CROSS/BLUE SHIELD | Attending: Emergency Medicine | Admitting: Emergency Medicine

## 2017-07-03 DIAGNOSIS — S0990XA Unspecified injury of head, initial encounter: Secondary | ICD-10-CM | POA: Diagnosis not present

## 2017-07-03 DIAGNOSIS — T300 Burn of unspecified body region, unspecified degree: Secondary | ICD-10-CM

## 2017-07-03 DIAGNOSIS — S0083XA Contusion of other part of head, initial encounter: Secondary | ICD-10-CM | POA: Diagnosis not present

## 2017-07-03 DIAGNOSIS — Z79899 Other long term (current) drug therapy: Secondary | ICD-10-CM | POA: Diagnosis not present

## 2017-07-03 DIAGNOSIS — W19XXXA Unspecified fall, initial encounter: Secondary | ICD-10-CM

## 2017-07-03 DIAGNOSIS — Y998 Other external cause status: Secondary | ICD-10-CM | POA: Diagnosis not present

## 2017-07-03 DIAGNOSIS — Z85831 Personal history of malignant neoplasm of soft tissue: Secondary | ICD-10-CM | POA: Insufficient documentation

## 2017-07-03 DIAGNOSIS — T22211A Burn of second degree of right forearm, initial encounter: Secondary | ICD-10-CM | POA: Diagnosis not present

## 2017-07-03 DIAGNOSIS — Y92000 Kitchen of unspecified non-institutional (private) residence as  the place of occurrence of the external cause: Secondary | ICD-10-CM | POA: Insufficient documentation

## 2017-07-03 DIAGNOSIS — W01198A Fall on same level from slipping, tripping and stumbling with subsequent striking against other object, initial encounter: Secondary | ICD-10-CM | POA: Diagnosis not present

## 2017-07-03 DIAGNOSIS — I10 Essential (primary) hypertension: Secondary | ICD-10-CM | POA: Insufficient documentation

## 2017-07-03 DIAGNOSIS — Y9389 Activity, other specified: Secondary | ICD-10-CM | POA: Insufficient documentation

## 2017-07-03 DIAGNOSIS — E039 Hypothyroidism, unspecified: Secondary | ICD-10-CM | POA: Insufficient documentation

## 2017-07-03 DIAGNOSIS — X102XXA Contact with fats and cooking oils, initial encounter: Secondary | ICD-10-CM | POA: Diagnosis not present

## 2017-07-03 DIAGNOSIS — Z23 Encounter for immunization: Secondary | ICD-10-CM | POA: Diagnosis not present

## 2017-07-03 DIAGNOSIS — T310 Burns involving less than 10% of body surface: Secondary | ICD-10-CM | POA: Diagnosis not present

## 2017-07-03 DIAGNOSIS — T22231A Burn of second degree of right upper arm, initial encounter: Secondary | ICD-10-CM | POA: Insufficient documentation

## 2017-07-03 HISTORY — DX: Fibromyalgia: M79.7

## 2017-07-03 MED ORDER — LIDOCAINE 5 % EX OINT: 1.0000 "application " | TOPICAL_OINTMENT | CUTANEOUS | 0 refills | Status: DC | PRN

## 2017-07-03 MED ORDER — TETANUS-DIPHTH-ACELL PERTUSSIS 5-2.5-18.5 LF-MCG/0.5 IM SUSP
0.5000 mL | Freq: Once | INTRAMUSCULAR | Status: AC
Start: 2017-07-03 — End: 2017-07-03
  Administered 2017-07-03: 0.5 mL via INTRAMUSCULAR
  Filled 2017-07-03: qty 0.5

## 2017-07-03 MED FILL — LIDOCAINE 5% OINTMENT: 5 | 10 days supply | Qty: 30 | Fill #0
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
## 2017-07-03 NOTE — Discharge Instructions (Signed)
Apply bacitracin to your burns, you may get this over the counter.  If you can tolerate a bandage or if the burns become open ulcers, keep them covered with a nonstick bandage (like telfa gauze.)

## 2017-07-05 ENCOUNTER — Ambulatory Visit (HOSPITAL_BASED_OUTPATIENT_CLINIC_OR_DEPARTMENT_OTHER): Payer: BLUE CROSS/BLUE SHIELD | Admitting: Physical Medicine & Rehabilitation

## 2017-07-05 ENCOUNTER — Encounter: Payer: BLUE CROSS/BLUE SHIELD | Attending: Physical Medicine & Rehabilitation

## 2017-07-05 ENCOUNTER — Encounter: Payer: Self-pay | Admitting: Physical Medicine & Rehabilitation

## 2017-07-05 VITALS — BP 136/84 | HR 79

## 2017-07-05 DIAGNOSIS — M7060 Trochanteric bursitis, unspecified hip: Secondary | ICD-10-CM | POA: Diagnosis not present

## 2017-07-05 DIAGNOSIS — Z823 Family history of stroke: Secondary | ICD-10-CM | POA: Insufficient documentation

## 2017-07-05 DIAGNOSIS — M76892 Other specified enthesopathies of left lower limb, excluding foot: Secondary | ICD-10-CM | POA: Diagnosis not present

## 2017-07-05 DIAGNOSIS — G8929 Other chronic pain: Secondary | ICD-10-CM | POA: Diagnosis not present

## 2017-07-05 DIAGNOSIS — Z833 Family history of diabetes mellitus: Secondary | ICD-10-CM | POA: Diagnosis not present

## 2017-07-05 DIAGNOSIS — M47816 Spondylosis without myelopathy or radiculopathy, lumbar region: Secondary | ICD-10-CM | POA: Diagnosis not present

## 2017-07-05 DIAGNOSIS — I10 Essential (primary) hypertension: Secondary | ICD-10-CM | POA: Diagnosis not present

## 2017-07-05 DIAGNOSIS — Z85831 Personal history of malignant neoplasm of soft tissue: Secondary | ICD-10-CM | POA: Insufficient documentation

## 2017-07-05 DIAGNOSIS — Z8249 Family history of ischemic heart disease and other diseases of the circulatory system: Secondary | ICD-10-CM | POA: Diagnosis not present

## 2017-07-05 DIAGNOSIS — M7918 Myalgia, other site: Secondary | ICD-10-CM | POA: Insufficient documentation

## 2017-07-05 DIAGNOSIS — M545 Low back pain: Secondary | ICD-10-CM | POA: Insufficient documentation

## 2017-07-05 DIAGNOSIS — M1990 Unspecified osteoarthritis, unspecified site: Secondary | ICD-10-CM | POA: Insufficient documentation

## 2017-07-05 DIAGNOSIS — M25852 Other specified joint disorders, left hip: Secondary | ICD-10-CM | POA: Diagnosis not present

## 2017-07-05 DIAGNOSIS — M67952 Unspecified disorder of synovium and tendon, left thigh: Secondary | ICD-10-CM | POA: Diagnosis not present

## 2017-07-05 DIAGNOSIS — E039 Hypothyroidism, unspecified: Secondary | ICD-10-CM | POA: Insufficient documentation

## 2017-07-05 NOTE — Progress Notes (Signed)
Right lumbar L3, L4 medial branch blocks and L5 dorsal ramus injection under fluoroscopic guidance  Indication: Right Lumbar pain which is not relieved by medication management or other conservative care and interfering with self-care and mobility.  Informed consent was obtained after describing risks and benefits of the procedure with the patient, this includes bleeding, bruising, infection, paralysis and medication side effects. The patient wishes to proceed and has given written consent. The patient was placed in a prone position. The lumbar area was marked and prepped with Betadine. One ML of 1% lidocaine was injected into each of 3 areas into the skin and subcutaneous tissue. Then a 22-gauge 5 inch spinal needle was inserted targeting the junction of the Right S1 superior articular process and sacral ala junction. Needle was advanced under fluoroscopic guidance. Bone contact was made.Isovue 200 was injected x0.5 mL demonstrating no intravascular uptake. Then a solution containing 2% MPF lidocaine was injected x0.5 mL. Then the Right L5 superior articular process in transverse process junction was targeted. Bone contact was made.Isovue 200 was injected x0.5 mL demonstrating no intravascular uptake. Then a solution containing 2% MPF lidocaine was injected x0.5 mL. Then the Right L4 superior articular process in transverse process junction was targeted. Bone contact was made. Isovue 200 was injected x0.5 mL demonstrating no intravascular uptake. Then a solution containing2% MPF lidocaine was injected x0.5 mL Patient tolerated procedure well. Post procedure instructions were given. Please refer to post procedure form.  Post injection hip and back pain is 0 Schedule for radiofrequency right L3-L4 medial branch and right L5 dorsal ramus

## 2017-07-05 NOTE — Patient Instructions (Signed)
Would rec R L3-4-5 Radiofrequency repeat if this injection produces a 50-100% relief of R side low back pain  Please pay attention to R hip pain as well to see if it improves after this injection

## 2017-07-05 NOTE — Progress Notes (Signed)
  [facility]   Name: Whitney Daniel DOB:1955/11/22 MRN: [id number]  Date:07/05/2017  Physician: Alysia Penna, MD    Nurse/CMA: Koraima Albertsen CMA  Allergies:  Allergies  Allergen Reactions  . Codeine Other (See Comments)    Patient states "she did not like the way codeine made her feel"    . Vicodin [Hydrocodone-Acetaminophen] Nausea And Vomiting    Doesn't like how it makes her feel but can take if needs to for pain    Consent Signed: Yes.    Is patient diabetic? No.  CBG today? NA  Pregnant: No. LMP: No LMP recorded. Patient is postmenopausal. (age 39-55)  Anticoagulants: no Anti-inflammatory: no Antibiotics: no  Procedure: L3-5 MBB  Position: Prone   Start Time: 1:12 PM     End Time: 1:22pm Fluoro Time: 34  RN/CMA Hydee Fleece CMA [place], CMA    Time 1248 1:26pm    BP 136/84 153/100    Pulse 79 67    Respirations 16 16    O2 Sat 98 98    S/S 6 6    Pain Level 7/10 0/10 back 0/10 Hip     D/C home with [facility], patient A & O X 3, D/C instructions reviewed, and sits independently.

## 2017-07-06 DIAGNOSIS — M76899 Other specified enthesopathies of unspecified lower limb, excluding foot: Secondary | ICD-10-CM | POA: Diagnosis not present

## 2017-07-06 DIAGNOSIS — M76892 Other specified enthesopathies of left lower limb, excluding foot: Secondary | ICD-10-CM | POA: Diagnosis not present

## 2017-07-06 DIAGNOSIS — M76891 Other specified enthesopathies of right lower limb, excluding foot: Secondary | ICD-10-CM | POA: Diagnosis not present

## 2017-07-09 DIAGNOSIS — Z6830 Body mass index (BMI) 30.0-30.9, adult: Secondary | ICD-10-CM | POA: Diagnosis not present

## 2017-07-09 DIAGNOSIS — Z885 Allergy status to narcotic agent status: Secondary | ICD-10-CM | POA: Diagnosis not present

## 2017-07-09 DIAGNOSIS — J45909 Unspecified asthma, uncomplicated: Secondary | ICD-10-CM | POA: Diagnosis not present

## 2017-07-09 DIAGNOSIS — E669 Obesity, unspecified: Secondary | ICD-10-CM | POA: Diagnosis not present

## 2017-07-09 DIAGNOSIS — T22011D Burn of unspecified degree of right forearm, subsequent encounter: Secondary | ICD-10-CM | POA: Diagnosis not present

## 2017-07-09 DIAGNOSIS — M19011 Primary osteoarthritis, right shoulder: Secondary | ICD-10-CM | POA: Diagnosis not present

## 2017-07-09 DIAGNOSIS — Z79899 Other long term (current) drug therapy: Secondary | ICD-10-CM | POA: Diagnosis not present

## 2017-07-09 DIAGNOSIS — M948X8 Other specified disorders of cartilage, other site: Secondary | ICD-10-CM | POA: Diagnosis not present

## 2017-07-09 DIAGNOSIS — Z8589 Personal history of malignant neoplasm of other organs and systems: Secondary | ICD-10-CM | POA: Diagnosis not present

## 2017-07-09 DIAGNOSIS — M24151 Other articular cartilage disorders, right hip: Secondary | ICD-10-CM | POA: Diagnosis not present

## 2017-07-09 DIAGNOSIS — M76891 Other specified enthesopathies of right lower limb, excluding foot: Secondary | ICD-10-CM | POA: Diagnosis not present

## 2017-07-09 DIAGNOSIS — S8001XD Contusion of right knee, subsequent encounter: Secondary | ICD-10-CM | POA: Diagnosis not present

## 2017-07-09 DIAGNOSIS — M170 Bilateral primary osteoarthritis of knee: Secondary | ICD-10-CM | POA: Diagnosis not present

## 2017-07-09 DIAGNOSIS — E039 Hypothyroidism, unspecified: Secondary | ICD-10-CM | POA: Diagnosis not present

## 2017-07-09 DIAGNOSIS — I10 Essential (primary) hypertension: Secondary | ICD-10-CM | POA: Diagnosis not present

## 2017-07-09 DIAGNOSIS — S0012XD Contusion of left eyelid and periocular area, subsequent encounter: Secondary | ICD-10-CM | POA: Diagnosis not present

## 2017-07-09 DIAGNOSIS — M25851 Other specified joint disorders, right hip: Secondary | ICD-10-CM | POA: Diagnosis not present

## 2017-07-09 DIAGNOSIS — M1611 Unilateral primary osteoarthritis, right hip: Secondary | ICD-10-CM | POA: Diagnosis not present

## 2017-07-09 DIAGNOSIS — M94251 Chondromalacia, right hip: Secondary | ICD-10-CM | POA: Diagnosis not present

## 2017-07-10 DIAGNOSIS — M25851 Other specified joint disorders, right hip: Secondary | ICD-10-CM | POA: Diagnosis not present

## 2017-07-10 DIAGNOSIS — S8001XD Contusion of right knee, subsequent encounter: Secondary | ICD-10-CM | POA: Diagnosis not present

## 2017-07-10 DIAGNOSIS — J45909 Unspecified asthma, uncomplicated: Secondary | ICD-10-CM | POA: Diagnosis not present

## 2017-07-10 DIAGNOSIS — I10 Essential (primary) hypertension: Secondary | ICD-10-CM | POA: Diagnosis not present

## 2017-07-10 DIAGNOSIS — M76891 Other specified enthesopathies of right lower limb, excluding foot: Secondary | ICD-10-CM | POA: Diagnosis not present

## 2017-07-10 DIAGNOSIS — S0012XD Contusion of left eyelid and periocular area, subsequent encounter: Secondary | ICD-10-CM | POA: Diagnosis not present

## 2017-07-10 DIAGNOSIS — T22011D Burn of unspecified degree of right forearm, subsequent encounter: Secondary | ICD-10-CM | POA: Diagnosis not present

## 2017-07-10 DIAGNOSIS — E669 Obesity, unspecified: Secondary | ICD-10-CM | POA: Diagnosis not present

## 2017-07-10 DIAGNOSIS — Z79899 Other long term (current) drug therapy: Secondary | ICD-10-CM | POA: Diagnosis not present

## 2017-07-10 DIAGNOSIS — Z8589 Personal history of malignant neoplasm of other organs and systems: Secondary | ICD-10-CM | POA: Diagnosis not present

## 2017-07-10 DIAGNOSIS — Z6830 Body mass index (BMI) 30.0-30.9, adult: Secondary | ICD-10-CM | POA: Diagnosis not present

## 2017-07-10 DIAGNOSIS — M948X8 Other specified disorders of cartilage, other site: Secondary | ICD-10-CM | POA: Diagnosis not present

## 2017-07-10 DIAGNOSIS — Z885 Allergy status to narcotic agent status: Secondary | ICD-10-CM | POA: Diagnosis not present

## 2017-07-10 DIAGNOSIS — E039 Hypothyroidism, unspecified: Secondary | ICD-10-CM | POA: Diagnosis not present

## 2017-07-10 DIAGNOSIS — M19011 Primary osteoarthritis, right shoulder: Secondary | ICD-10-CM | POA: Diagnosis not present

## 2017-07-10 DIAGNOSIS — M170 Bilateral primary osteoarthritis of knee: Secondary | ICD-10-CM | POA: Diagnosis not present

## 2017-07-24 DIAGNOSIS — Z4789 Encounter for other orthopedic aftercare: Secondary | ICD-10-CM | POA: Diagnosis not present

## 2017-07-24 DIAGNOSIS — Z791 Long term (current) use of non-steroidal anti-inflammatories (NSAID): Secondary | ICD-10-CM | POA: Diagnosis not present

## 2017-07-24 DIAGNOSIS — Z7982 Long term (current) use of aspirin: Secondary | ICD-10-CM | POA: Diagnosis not present

## 2017-07-24 DIAGNOSIS — Z9889 Other specified postprocedural states: Secondary | ICD-10-CM | POA: Diagnosis not present

## 2017-07-24 DIAGNOSIS — Z79891 Long term (current) use of opiate analgesic: Secondary | ICD-10-CM | POA: Diagnosis not present

## 2017-07-27 DIAGNOSIS — Z7982 Long term (current) use of aspirin: Secondary | ICD-10-CM | POA: Diagnosis not present

## 2017-07-27 DIAGNOSIS — Z79891 Long term (current) use of opiate analgesic: Secondary | ICD-10-CM | POA: Diagnosis not present

## 2017-07-27 DIAGNOSIS — Z791 Long term (current) use of non-steroidal anti-inflammatories (NSAID): Secondary | ICD-10-CM | POA: Diagnosis not present

## 2017-07-27 DIAGNOSIS — Z4789 Encounter for other orthopedic aftercare: Secondary | ICD-10-CM | POA: Diagnosis not present

## 2017-07-27 DIAGNOSIS — Z9889 Other specified postprocedural states: Secondary | ICD-10-CM | POA: Diagnosis not present

## 2017-07-30 DIAGNOSIS — Z791 Long term (current) use of non-steroidal anti-inflammatories (NSAID): Secondary | ICD-10-CM | POA: Diagnosis not present

## 2017-07-30 DIAGNOSIS — Z7982 Long term (current) use of aspirin: Secondary | ICD-10-CM | POA: Diagnosis not present

## 2017-07-30 DIAGNOSIS — Z9889 Other specified postprocedural states: Secondary | ICD-10-CM | POA: Diagnosis not present

## 2017-07-30 DIAGNOSIS — Z79891 Long term (current) use of opiate analgesic: Secondary | ICD-10-CM | POA: Diagnosis not present

## 2017-07-30 DIAGNOSIS — Z4789 Encounter for other orthopedic aftercare: Secondary | ICD-10-CM | POA: Diagnosis not present

## 2017-07-31 DIAGNOSIS — M1611 Unilateral primary osteoarthritis, right hip: Secondary | ICD-10-CM | POA: Diagnosis not present

## 2017-08-02 ENCOUNTER — Encounter: Payer: Self-pay | Admitting: Physical Medicine & Rehabilitation

## 2017-08-02 ENCOUNTER — Other Ambulatory Visit: Payer: Self-pay

## 2017-08-02 ENCOUNTER — Ambulatory Visit (HOSPITAL_BASED_OUTPATIENT_CLINIC_OR_DEPARTMENT_OTHER): Payer: BLUE CROSS/BLUE SHIELD | Admitting: Physical Medicine & Rehabilitation

## 2017-08-02 VITALS — BP 156/91 | HR 85 | Resp 14

## 2017-08-02 DIAGNOSIS — Z79891 Long term (current) use of opiate analgesic: Secondary | ICD-10-CM | POA: Diagnosis not present

## 2017-08-02 DIAGNOSIS — Z4789 Encounter for other orthopedic aftercare: Secondary | ICD-10-CM | POA: Diagnosis not present

## 2017-08-02 DIAGNOSIS — I10 Essential (primary) hypertension: Secondary | ICD-10-CM | POA: Diagnosis not present

## 2017-08-02 DIAGNOSIS — Z823 Family history of stroke: Secondary | ICD-10-CM | POA: Diagnosis not present

## 2017-08-02 DIAGNOSIS — M1990 Unspecified osteoarthritis, unspecified site: Secondary | ICD-10-CM | POA: Diagnosis not present

## 2017-08-02 DIAGNOSIS — Z9889 Other specified postprocedural states: Secondary | ICD-10-CM | POA: Diagnosis not present

## 2017-08-02 DIAGNOSIS — M545 Low back pain: Secondary | ICD-10-CM | POA: Diagnosis not present

## 2017-08-02 DIAGNOSIS — M47816 Spondylosis without myelopathy or radiculopathy, lumbar region: Secondary | ICD-10-CM | POA: Diagnosis not present

## 2017-08-02 DIAGNOSIS — M7918 Myalgia, other site: Secondary | ICD-10-CM | POA: Diagnosis not present

## 2017-08-02 DIAGNOSIS — E039 Hypothyroidism, unspecified: Secondary | ICD-10-CM | POA: Diagnosis not present

## 2017-08-02 DIAGNOSIS — Z8249 Family history of ischemic heart disease and other diseases of the circulatory system: Secondary | ICD-10-CM | POA: Diagnosis not present

## 2017-08-02 DIAGNOSIS — Z833 Family history of diabetes mellitus: Secondary | ICD-10-CM | POA: Diagnosis not present

## 2017-08-02 DIAGNOSIS — G8929 Other chronic pain: Secondary | ICD-10-CM | POA: Diagnosis not present

## 2017-08-02 DIAGNOSIS — Z85831 Personal history of malignant neoplasm of soft tissue: Secondary | ICD-10-CM | POA: Diagnosis not present

## 2017-08-02 DIAGNOSIS — Z791 Long term (current) use of non-steroidal anti-inflammatories (NSAID): Secondary | ICD-10-CM | POA: Diagnosis not present

## 2017-08-02 DIAGNOSIS — Z7982 Long term (current) use of aspirin: Secondary | ICD-10-CM | POA: Diagnosis not present

## 2017-08-02 NOTE — Progress Notes (Signed)
Left Lumbar L3, L4  medial branch blocks and L 5 dorsal ramus injection under fluoroscopic guidance   Indication: Left Lumbar pain which is not relieved by medication management or other conservative care and interfering with self-care and mobility.  Informed consent was obtained after describing risks and benefits of the procedure with the patient, this includes bleeding, bruising, infection, paralysis and medication side effects.  The patient wishes to proceed and has given written consent.  The patient was placed in a prone position.  The lumbar area was marked and prepped with Betadine.  One mL of 1% lidocaine was injected into each of 3 areas into the skin and subcutaneous tissue.  Then a 22-gauge 5in spinal needle was inserted targeting the junction of the left S1 superior articular process and sacral ala junction.  Needle was advanced under fluoroscopic guidance.  Bone contact was made. Isovue 200 was injected x 0.5 mL demonstrating no intravascular uptake.  Then a solution containing  2% MPF lidocaine was injected x 0.5 mL.  Then the left L5 superior articular process in transverse process junction was targeted.  Bone contact was made. Isovue 200 was injected x 0.5 mL demonstrating no intravascular uptake.  Then a solution containing 2% MPF lidocaine was injected x 0.5 mL.  Then the left L4 superior articular process in transverse process junction was targeted.  Bone contact was made.  Isovue 200 was injected x 0.5 mL demonstrating no intravascular uptake.  Then a solution containing  2% MPF lidocaine was injected x 0.5 mL.  Patient tolerated procedure well.  Post procedure instructions were given.  Patient was originally scheduled for right L3-L4 medial branch blocks and right L5 dorsal ramus injection.  Patient initially state stated that she got 4 hours of complete relief from right L3-L4 medial branch and right L5 dorsal ramus injections form 06/04/2017.  Then the patient states that she really does  not have any right-sided pain today and all of her low back pain is switched to the left side.  Therefore we discussed that the most prudent course of action would be to do medial branch blocks left L3-4 as well as left L5 dorsal ramus injection under fluoroscopic guidance line follow-up in 4 weeks to discuss effects as well as planning future treatment

## 2017-08-02 NOTE — Patient Instructions (Addendum)

## 2017-08-02 NOTE — Progress Notes (Signed)
  [facility]   Name: Whitney Daniel DOB:17-Oct-1955 MRN: [id number]  Date:08/02/2017  Physician: Alysia Penna, MD    Nurse/CMA: Analucia Hush, CMA  Allergies:  Allergies  Allergen Reactions  . Codeine Other (See Comments)    Patient states "she did not like the way codeine made her feel"    . Vicodin [Hydrocodone-Acetaminophen] Nausea And Vomiting    Doesn't like how it makes her feel but can take if needs to for pain    Consent Signed: Yes.    Is patient diabetic? No.  CBG today?   Pregnant: No. LMP: No LMP recorded. Patient is postmenopausal. (age 99-55)  Anticoagulants: no Anti-inflammatory: no Antibiotics: no  Procedure: left L3,4,5 medial branch block  Position: Prone Start Time: 1:15pm  End Time:1:20pm   Fluoro Time: 32s  RN/CMA Marlies Ligman, CMA Perle Gibbon, CMA    Time 12:55pm 1:25pm    BP 156/91 158/84    Pulse 85 64    Respirations 14 14    O2 Sat 95 98    S/S 6 6    Pain Level 4/10 0/10     D/C home with Whitney Daniel, patient A & O X 3, D/C instructions reviewed, and sits independently.

## 2017-08-09 DIAGNOSIS — Z791 Long term (current) use of non-steroidal anti-inflammatories (NSAID): Secondary | ICD-10-CM | POA: Diagnosis not present

## 2017-08-09 DIAGNOSIS — Z9889 Other specified postprocedural states: Secondary | ICD-10-CM | POA: Diagnosis not present

## 2017-08-09 DIAGNOSIS — Z79891 Long term (current) use of opiate analgesic: Secondary | ICD-10-CM | POA: Diagnosis not present

## 2017-08-09 DIAGNOSIS — Z4789 Encounter for other orthopedic aftercare: Secondary | ICD-10-CM | POA: Diagnosis not present

## 2017-08-09 DIAGNOSIS — Z7982 Long term (current) use of aspirin: Secondary | ICD-10-CM | POA: Diagnosis not present

## 2017-08-10 DIAGNOSIS — F4323 Adjustment disorder with mixed anxiety and depressed mood: Secondary | ICD-10-CM | POA: Diagnosis not present

## 2017-08-23 DIAGNOSIS — M76891 Other specified enthesopathies of right lower limb, excluding foot: Secondary | ICD-10-CM | POA: Diagnosis not present

## 2017-08-23 DIAGNOSIS — M25651 Stiffness of right hip, not elsewhere classified: Secondary | ICD-10-CM | POA: Diagnosis not present

## 2017-08-23 DIAGNOSIS — M6281 Muscle weakness (generalized): Secondary | ICD-10-CM | POA: Diagnosis not present

## 2017-08-30 DIAGNOSIS — M6281 Muscle weakness (generalized): Secondary | ICD-10-CM | POA: Diagnosis not present

## 2017-08-30 DIAGNOSIS — M76891 Other specified enthesopathies of right lower limb, excluding foot: Secondary | ICD-10-CM | POA: Diagnosis not present

## 2017-08-30 DIAGNOSIS — M1611 Unilateral primary osteoarthritis, right hip: Secondary | ICD-10-CM | POA: Diagnosis not present

## 2017-08-30 DIAGNOSIS — M25651 Stiffness of right hip, not elsewhere classified: Secondary | ICD-10-CM | POA: Diagnosis not present

## 2017-09-03 DIAGNOSIS — Z1231 Encounter for screening mammogram for malignant neoplasm of breast: Secondary | ICD-10-CM | POA: Diagnosis not present

## 2017-09-20 ENCOUNTER — Encounter: Payer: BLUE CROSS/BLUE SHIELD | Attending: Physical Medicine & Rehabilitation

## 2017-09-20 ENCOUNTER — Ambulatory Visit: Payer: BLUE CROSS/BLUE SHIELD | Admitting: Physical Medicine & Rehabilitation

## 2017-09-20 DIAGNOSIS — I10 Essential (primary) hypertension: Secondary | ICD-10-CM | POA: Insufficient documentation

## 2017-09-20 DIAGNOSIS — Z833 Family history of diabetes mellitus: Secondary | ICD-10-CM | POA: Insufficient documentation

## 2017-09-20 DIAGNOSIS — Z823 Family history of stroke: Secondary | ICD-10-CM | POA: Insufficient documentation

## 2017-09-20 DIAGNOSIS — M545 Low back pain: Secondary | ICD-10-CM | POA: Insufficient documentation

## 2017-09-20 DIAGNOSIS — M7918 Myalgia, other site: Secondary | ICD-10-CM | POA: Insufficient documentation

## 2017-09-20 DIAGNOSIS — E039 Hypothyroidism, unspecified: Secondary | ICD-10-CM | POA: Insufficient documentation

## 2017-09-20 DIAGNOSIS — G8929 Other chronic pain: Secondary | ICD-10-CM | POA: Insufficient documentation

## 2017-09-20 DIAGNOSIS — Z85831 Personal history of malignant neoplasm of soft tissue: Secondary | ICD-10-CM | POA: Insufficient documentation

## 2017-09-20 DIAGNOSIS — Z8249 Family history of ischemic heart disease and other diseases of the circulatory system: Secondary | ICD-10-CM | POA: Insufficient documentation

## 2017-09-20 DIAGNOSIS — M1990 Unspecified osteoarthritis, unspecified site: Secondary | ICD-10-CM | POA: Insufficient documentation

## 2017-09-28 DIAGNOSIS — R7303 Prediabetes: Secondary | ICD-10-CM | POA: Diagnosis not present

## 2017-09-28 DIAGNOSIS — E039 Hypothyroidism, unspecified: Secondary | ICD-10-CM | POA: Diagnosis not present

## 2017-09-28 DIAGNOSIS — E7800 Pure hypercholesterolemia, unspecified: Secondary | ICD-10-CM | POA: Diagnosis not present

## 2017-09-28 DIAGNOSIS — I10 Essential (primary) hypertension: Secondary | ICD-10-CM | POA: Diagnosis not present

## 2017-10-04 DIAGNOSIS — I10 Essential (primary) hypertension: Secondary | ICD-10-CM | POA: Diagnosis not present

## 2017-10-04 DIAGNOSIS — J4520 Mild intermittent asthma, uncomplicated: Secondary | ICD-10-CM | POA: Diagnosis not present

## 2017-10-04 DIAGNOSIS — E7800 Pure hypercholesterolemia, unspecified: Secondary | ICD-10-CM | POA: Diagnosis not present

## 2017-10-04 DIAGNOSIS — R7303 Prediabetes: Secondary | ICD-10-CM | POA: Diagnosis not present

## 2017-10-04 DIAGNOSIS — E039 Hypothyroidism, unspecified: Secondary | ICD-10-CM | POA: Diagnosis not present

## 2017-10-08 DIAGNOSIS — Z01419 Encounter for gynecological examination (general) (routine) without abnormal findings: Secondary | ICD-10-CM | POA: Diagnosis not present

## 2017-10-08 DIAGNOSIS — Z124 Encounter for screening for malignant neoplasm of cervix: Secondary | ICD-10-CM | POA: Diagnosis not present

## 2017-10-22 ENCOUNTER — Ambulatory Visit: Payer: BLUE CROSS/BLUE SHIELD | Admitting: Physical Medicine & Rehabilitation

## 2017-10-25 ENCOUNTER — Encounter: Payer: Self-pay | Admitting: Physical Medicine & Rehabilitation

## 2017-10-25 ENCOUNTER — Ambulatory Visit (HOSPITAL_BASED_OUTPATIENT_CLINIC_OR_DEPARTMENT_OTHER): Payer: BLUE CROSS/BLUE SHIELD | Admitting: Physical Medicine & Rehabilitation

## 2017-10-25 ENCOUNTER — Encounter: Payer: BLUE CROSS/BLUE SHIELD | Attending: Physical Medicine & Rehabilitation

## 2017-10-25 VITALS — BP 136/91 | HR 88

## 2017-10-25 DIAGNOSIS — Z8249 Family history of ischemic heart disease and other diseases of the circulatory system: Secondary | ICD-10-CM | POA: Diagnosis not present

## 2017-10-25 DIAGNOSIS — M7918 Myalgia, other site: Secondary | ICD-10-CM | POA: Insufficient documentation

## 2017-10-25 DIAGNOSIS — E039 Hypothyroidism, unspecified: Secondary | ICD-10-CM | POA: Insufficient documentation

## 2017-10-25 DIAGNOSIS — M545 Low back pain: Secondary | ICD-10-CM | POA: Diagnosis not present

## 2017-10-25 DIAGNOSIS — I10 Essential (primary) hypertension: Secondary | ICD-10-CM | POA: Diagnosis not present

## 2017-10-25 DIAGNOSIS — Z85831 Personal history of malignant neoplasm of soft tissue: Secondary | ICD-10-CM | POA: Insufficient documentation

## 2017-10-25 DIAGNOSIS — Z79899 Other long term (current) drug therapy: Secondary | ICD-10-CM

## 2017-10-25 DIAGNOSIS — Z823 Family history of stroke: Secondary | ICD-10-CM | POA: Insufficient documentation

## 2017-10-25 DIAGNOSIS — Z833 Family history of diabetes mellitus: Secondary | ICD-10-CM | POA: Insufficient documentation

## 2017-10-25 DIAGNOSIS — Z5181 Encounter for therapeutic drug level monitoring: Secondary | ICD-10-CM | POA: Diagnosis not present

## 2017-10-25 DIAGNOSIS — G8929 Other chronic pain: Secondary | ICD-10-CM | POA: Diagnosis not present

## 2017-10-25 DIAGNOSIS — M1990 Unspecified osteoarthritis, unspecified site: Secondary | ICD-10-CM | POA: Diagnosis not present

## 2017-10-25 MED ORDER — TRAMADOL HCL 50 MG PO TABS: 50.0000 mg | ORAL_TABLET | Freq: Three times a day (TID) | ORAL | 5 refills | Status: DC

## 2017-10-25 MED ORDER — PREGABALIN 100 MG PO CAPS: 100.0000 mg | ORAL_CAPSULE | Freq: Three times a day (TID) | ORAL | 5 refills | Status: DC

## 2017-10-25 NOTE — Progress Notes (Signed)
Subjective:    Patient ID: Whitney Daniel, female    DOB: 05-17-1956, 62 y.o.   MRN: [id number]  HPI Left sided low back pain resolved after Lumbar L3-4-5 MBB performed 08/02/17 Pain returned 1 wk ago  Takes Lyrica and Tramadol twice aday  [facility] ortho hip arthroscopy, this was for labral tear as well as a right greater trochanteric bursectomy on 08/22/2017.  Patient has less lateral hip pain since that time.  [employer] sneakers program just started last week   Sciatic pain better on Lyrica Pain Inventory Average Pain 7 Pain Right Now 6 My pain is constant, tingling and aching  In the last 24 hours, has pain interfered with the following? General activity 5 Relation with others 2 Enjoyment of life 0 What TIME of day is your pain at its worst? . Sleep (in general) Fair  Pain is worse with: walking, standing and some activites Pain improves with: rest, medication and injections Relief from Meds: 9  Mobility walk without assistance ability to climb steps?  yes do you drive?  yes  Function disabled: date disabled . I need assistance with the following:  household duties  Neuro/Psych weakness numbness trouble walking dizziness  Prior Studies Any changes since last visit?  no  Physicians involved in your care Any changes since last visit?  no   Family History  Problem Relation Age of Onset  . Hypertension Mother   . CVA Mother   . Cancer Father   . Diabetes Father   . Diabetes Sister   . Heart attack Sister    Social History   Socioeconomic History  . Marital status: Married    Spouse name: None  . Number of children: None  . Years of education: None  . Highest education level: None  Social Needs  . Financial resource strain: None  . Food insecurity - worry: None  . Food insecurity - inability: None  . Transportation needs - medical: None  . Transportation needs - non-medical: None  Occupational History  . None  Tobacco Use  . Smoking  status: Never Smoker  . Smokeless tobacco: Never Used  Substance and Sexual Activity  . Alcohol use: Yes    Alcohol/week: 0.0 oz    Comment: social - wine  . Drug use: No  . Sexual activity: Yes    Birth control/protection: Post-menopausal  Other Topics Concern  . None  Social History Narrative   Lives alone in a one story home.  Has 5 children.  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    Past Surgical History:  Procedure Laterality Date  . COLONOSCOPY    . DILATATION & CURRETTAGE/HYSTEROSCOPY WITH RESECTOCOPE N/A 07/15/2014   Procedure: DILATATION & CURETTAGE/HYSTEROSCOPY WITH RESECTOCOPE;  Surgeon: Marvene Staff, MD;  Location: [facility];  Service: Gynecology;  Laterality: N/A;  . DILATION AND CURETTAGE OF UTERUS     polyps  . right thigh surgery     cancer  . WISDOM TOOTH EXTRACTION     Past Medical History:  Diagnosis Date  . Anemia   . Cancer ([place])   . Fibromyalgia   . Hypertension   . Hypothyroidism   . Osteoarthritis   . SVD (spontaneous vaginal delivery)    x 5   BP (!) 136/91   Pulse 88   SpO2 98%   Opioid Risk Score:   Fall Risk Score:  `1  Depression screen PHQ 2/9  Depression screen [facility] 2/9 06/04/2015 04/27/2015  Decreased Interest 3 3  Down, Depressed, Hopeless 1 1  PHQ - 2 Score 4 4  Altered sleeping - 3  Tired, decreased energy - 3  Change in appetite - 2  Feeling bad or failure about yourself  - 1  Trouble concentrating - 1  Moving slowly or fidgety/restless - 3  Suicidal thoughts - 2  PHQ-9 Score - 19  Difficult doing work/chores - Extremely dIfficult     Review of Systems  Constitutional: Positive for unexpected weight change.  HENT: Negative.   Eyes: Negative.   Respiratory: Negative.   Cardiovascular: Negative.   Gastrointestinal: Negative.   Endocrine: Negative.   Genitourinary: Negative.   Musculoskeletal: Positive for  arthralgias, back pain, joint swelling and myalgias.  Skin: Negative.   Allergic/Immunologic: Negative.   Neurological: Negative.   Hematological: Negative.   Psychiatric/Behavioral: Negative.   All other systems reviewed and are negative.      Objective:   Physical Exam  Constitutional: She is oriented to person, place, and time. She appears well-developed and well-nourished.  HENT:  Head: Normocephalic and atraumatic.  Eyes: Conjunctivae and EOM are normal. Pupils are equal, round, and reactive to light.  Neck: Normal range of motion.  Neurological: She is alert and oriented to person, place, and time.  Motor strength is 5/5 in the knee extensors ankle dorsiflexors bilaterally Gait is without evidence of toe drag or knee instability however does have a forward flexed posture.  Psychiatric: She has a normal mood and affect.  Nursing note and vitals reviewed.   Lumbar spine has no tenderness palpation along the paraspinal muscle groups. Lumbar spine range of motion has full forward flexion extension is limited 75% lateral bending 75% left patient has more left-sided pain when bending toward the right side.      Assessment & Plan:  #1.  Chronic low back pain she has lumbar spondylosis without myelopathy.  Left-sided symptoms predominate.  She has had good results with lumbar medial branch blocks L3-4 as well as L5 dorsal ramus injection will repeat in approximately 2 weeks. 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  Right hip labral tear with chronic right trochanteric bursitis improved after debridement under arthroscopic guidance.  This was  performed at [facility] 08/22/2017

## 2017-11-01 ENCOUNTER — Telehealth: Payer: Self-pay | Admitting: *Deleted

## 2017-11-01 LAB — TOXASSURE SELECT,+ANTIDEPR,UR

## 2017-11-01 NOTE — Telephone Encounter (Signed)
Urine drug screen for this encounter is consistent for prescribed medication 

## 2017-11-15 DIAGNOSIS — E039 Hypothyroidism, unspecified: Secondary | ICD-10-CM | POA: Diagnosis not present

## 2017-11-19 DIAGNOSIS — K740 Hepatic fibrosis: Secondary | ICD-10-CM | POA: Diagnosis not present

## 2017-11-20 ENCOUNTER — Other Ambulatory Visit: Payer: Self-pay | Admitting: Nurse Practitioner

## 2017-11-20 DIAGNOSIS — K7400 Hepatic fibrosis, unspecified: Secondary | ICD-10-CM

## 2017-11-22 ENCOUNTER — Other Ambulatory Visit: Payer: BLUE CROSS/BLUE SHIELD

## 2017-11-29 DIAGNOSIS — Z780 Asymptomatic menopausal state: Secondary | ICD-10-CM | POA: Diagnosis not present

## 2017-11-29 DIAGNOSIS — M8589 Other specified disorders of bone density and structure, multiple sites: Secondary | ICD-10-CM | POA: Diagnosis not present

## 2017-12-03 ENCOUNTER — Other Ambulatory Visit: Payer: BLUE CROSS/BLUE SHIELD

## 2017-12-18 ENCOUNTER — Other Ambulatory Visit: Payer: BLUE CROSS/BLUE SHIELD

## 2018-01-08 ENCOUNTER — Other Ambulatory Visit: Payer: BLUE CROSS/BLUE SHIELD

## 2018-01-17 ENCOUNTER — Other Ambulatory Visit: Payer: BLUE CROSS/BLUE SHIELD

## 2018-02-04 DIAGNOSIS — G629 Polyneuropathy, unspecified: Secondary | ICD-10-CM | POA: Diagnosis not present

## 2018-02-04 DIAGNOSIS — Z7951 Long term (current) use of inhaled steroids: Secondary | ICD-10-CM | POA: Diagnosis not present

## 2018-02-04 DIAGNOSIS — E669 Obesity, unspecified: Secondary | ICD-10-CM | POA: Diagnosis not present

## 2018-02-04 DIAGNOSIS — Z79891 Long term (current) use of opiate analgesic: Secondary | ICD-10-CM | POA: Diagnosis not present

## 2018-02-04 DIAGNOSIS — J45909 Unspecified asthma, uncomplicated: Secondary | ICD-10-CM | POA: Diagnosis not present

## 2018-02-04 DIAGNOSIS — E039 Hypothyroidism, unspecified: Secondary | ICD-10-CM | POA: Diagnosis not present

## 2018-02-04 DIAGNOSIS — G8929 Other chronic pain: Secondary | ICD-10-CM | POA: Diagnosis not present

## 2018-02-04 DIAGNOSIS — Z791 Long term (current) use of non-steroidal anti-inflammatories (NSAID): Secondary | ICD-10-CM | POA: Diagnosis not present

## 2018-02-04 DIAGNOSIS — I10 Essential (primary) hypertension: Secondary | ICD-10-CM | POA: Diagnosis not present

## 2018-02-04 DIAGNOSIS — Z6832 Body mass index (BMI) 32.0-32.9, adult: Secondary | ICD-10-CM | POA: Diagnosis not present

## 2018-03-19 ENCOUNTER — Ambulatory Visit
Admission: RE | Admit: 2018-03-19 | Discharge: 2018-03-19 | Disposition: A | Discharge status: Home / Self Care | Payer: BLUE CROSS/BLUE SHIELD | Source: Ambulatory Visit | Attending: Nurse Practitioner | Admitting: Nurse Practitioner

## 2018-03-19 DIAGNOSIS — K7400 Hepatic fibrosis, unspecified: Secondary | ICD-10-CM

## 2018-04-04 DIAGNOSIS — J4520 Mild intermittent asthma, uncomplicated: Secondary | ICD-10-CM | POA: Diagnosis not present

## 2018-04-04 DIAGNOSIS — E039 Hypothyroidism, unspecified: Secondary | ICD-10-CM | POA: Diagnosis not present

## 2018-04-04 DIAGNOSIS — F419 Anxiety disorder, unspecified: Secondary | ICD-10-CM | POA: Diagnosis not present

## 2018-04-04 DIAGNOSIS — E669 Obesity, unspecified: Secondary | ICD-10-CM | POA: Diagnosis not present

## 2018-04-04 DIAGNOSIS — R7303 Prediabetes: Secondary | ICD-10-CM | POA: Diagnosis not present

## 2018-04-04 DIAGNOSIS — R69 Illness, unspecified: Secondary | ICD-10-CM | POA: Diagnosis not present

## 2018-04-04 DIAGNOSIS — I10 Essential (primary) hypertension: Secondary | ICD-10-CM | POA: Diagnosis not present

## 2018-04-04 DIAGNOSIS — E7800 Pure hypercholesterolemia, unspecified: Secondary | ICD-10-CM | POA: Diagnosis not present

## 2018-04-15 ENCOUNTER — Encounter (HOSPITAL_BASED_OUTPATIENT_CLINIC_OR_DEPARTMENT_OTHER): Payer: Self-pay | Admitting: Emergency Medicine

## 2018-04-15 ENCOUNTER — Other Ambulatory Visit: Payer: Self-pay

## 2018-04-15 ENCOUNTER — Other Ambulatory Visit (HOSPITAL_BASED_OUTPATIENT_CLINIC_OR_DEPARTMENT_OTHER): Payer: Self-pay

## 2018-04-15 ENCOUNTER — Emergency Department (HOSPITAL_BASED_OUTPATIENT_CLINIC_OR_DEPARTMENT_OTHER): Payer: BLUE CROSS/BLUE SHIELD

## 2018-04-15 ENCOUNTER — Observation Stay (HOSPITAL_BASED_OUTPATIENT_CLINIC_OR_DEPARTMENT_OTHER)
Admission: EM | Admit: 2018-04-15 | Discharge: 2018-04-17 | Disposition: A | Discharge status: Home / Self Care | Payer: BLUE CROSS/BLUE SHIELD | Attending: Internal Medicine | Admitting: Internal Medicine

## 2018-04-15 DIAGNOSIS — Z6832 Body mass index (BMI) 32.0-32.9, adult: Secondary | ICD-10-CM | POA: Diagnosis not present

## 2018-04-15 DIAGNOSIS — I2510 Atherosclerotic heart disease of native coronary artery without angina pectoris: Secondary | ICD-10-CM | POA: Insufficient documentation

## 2018-04-15 DIAGNOSIS — Z791 Long term (current) use of non-steroidal anti-inflammatories (NSAID): Secondary | ICD-10-CM | POA: Insufficient documentation

## 2018-04-15 DIAGNOSIS — R001 Bradycardia, unspecified: Secondary | ICD-10-CM | POA: Diagnosis not present

## 2018-04-15 DIAGNOSIS — Z8249 Family history of ischemic heart disease and other diseases of the circulatory system: Secondary | ICD-10-CM | POA: Diagnosis not present

## 2018-04-15 DIAGNOSIS — R7303 Prediabetes: Secondary | ICD-10-CM | POA: Insufficient documentation

## 2018-04-15 DIAGNOSIS — E669 Obesity, unspecified: Secondary | ICD-10-CM | POA: Insufficient documentation

## 2018-04-15 DIAGNOSIS — R071 Chest pain on breathing: Secondary | ICD-10-CM | POA: Diagnosis not present

## 2018-04-15 DIAGNOSIS — R079 Chest pain, unspecified: Principal | ICD-10-CM | POA: Diagnosis present

## 2018-04-15 DIAGNOSIS — E785 Hyperlipidemia, unspecified: Secondary | ICD-10-CM | POA: Diagnosis not present

## 2018-04-15 DIAGNOSIS — I10 Essential (primary) hypertension: Secondary | ICD-10-CM | POA: Diagnosis not present

## 2018-04-15 DIAGNOSIS — Z79899 Other long term (current) drug therapy: Secondary | ICD-10-CM | POA: Diagnosis not present

## 2018-04-15 DIAGNOSIS — M797 Fibromyalgia: Secondary | ICD-10-CM | POA: Insufficient documentation

## 2018-04-15 DIAGNOSIS — Z79891 Long term (current) use of opiate analgesic: Secondary | ICD-10-CM | POA: Diagnosis not present

## 2018-04-15 DIAGNOSIS — Z79890 Hormone replacement therapy (postmenopausal): Secondary | ICD-10-CM | POA: Insufficient documentation

## 2018-04-15 DIAGNOSIS — E039 Hypothyroidism, unspecified: Secondary | ICD-10-CM

## 2018-04-15 DIAGNOSIS — Z85831 Personal history of malignant neoplasm of soft tissue: Secondary | ICD-10-CM | POA: Diagnosis not present

## 2018-04-15 DIAGNOSIS — E876 Hypokalemia: Secondary | ICD-10-CM | POA: Diagnosis not present

## 2018-04-15 DIAGNOSIS — F419 Anxiety disorder, unspecified: Secondary | ICD-10-CM | POA: Diagnosis not present

## 2018-04-15 DIAGNOSIS — R69 Illness, unspecified: Secondary | ICD-10-CM | POA: Diagnosis not present

## 2018-04-15 LAB — BASIC METABOLIC PANEL
ANION GAP: 9 (ref 5–15)
BUN: 13 mg/dL (ref 6–20)
CO2: 26 mmol/L (ref 22–32)
CREATININE: 0.79 mg/dL (ref 0.44–1.00)
Calcium: 9.9 mg/dL (ref 8.9–10.3)
Chloride: 103 mmol/L (ref 98–111)
GFR calc Af Amer: >60 mL/min (ref >60)
Glucose, Bld: 88 mg/dL (ref 70–99)
Potassium: 3 mmol/L — ABNORMAL LOW (ref 3.5–5.1)
SODIUM: 138 mmol/L (ref 135–145)

## 2018-04-15 LAB — CBC
HCT: 38.2 % (ref 36.0–46.0)
HEMOGLOBIN: 12.9 g/dL (ref 12.0–15.0)
MCH: 27.9 pg (ref 26.0–34.0)
MCHC: 33.8 g/dL (ref 30.0–36.0)
MCV: 82.7 fL (ref 78.0–100.0)
PLATELETS: 279 10*3/uL (ref 150–400)
RBC: 4.62 MIL/uL (ref 3.87–5.11)
RDW: 13.9 % (ref 11.5–15.5)
WBC: 6 10*3/uL (ref 4.0–10.5)

## 2018-04-15 LAB — TROPONIN I

## 2018-04-15 LAB — D-DIMER, QUANTITATIVE (NOT AT ARMC): D DIMER QUANT: 1.38 ug{FEU}/mL — AB (ref 0.00–0.50)

## 2018-04-15 MED ORDER — POTASSIUM CHLORIDE IN NACL 20-0.9 MEQ/L-% IV SOLN
INTRAVENOUS | Status: DC
  Administered 2018-04-15: 22:00:00 via INTRAVENOUS
  Filled 2018-04-15: qty 1000

## 2018-04-15 MED ORDER — HYDROCHLOROTHIAZIDE 25 MG PO TABS
25.0000 mg | ORAL_TABLET | Freq: Every day | ORAL | Status: DC
  Administered 2018-04-16 – 2018-04-17 (×2): 25 mg via ORAL
  Filled 2018-04-15 (×2): qty 1

## 2018-04-15 MED ORDER — ACETAMINOPHEN 650 MG RE SUPP: 650.0000 mg | Freq: Four times a day (QID) | RECTAL | Status: DC | PRN

## 2018-04-15 MED ORDER — MOMETASONE FURO-FORMOTEROL FUM 200-5 MCG/ACT IN AERO
2.0000 | INHALATION_SPRAY | Freq: Two times a day (BID) | RESPIRATORY_TRACT | Status: DC
  Administered 2018-04-16 – 2018-04-17 (×2): 2 via RESPIRATORY_TRACT
  Filled 2018-04-15: qty 8.8

## 2018-04-15 MED ORDER — MORPHINE SULFATE (PF) 2 MG/ML IV SOLN: 0.5000 mg | INTRAVENOUS | Status: DC | PRN

## 2018-04-15 MED ORDER — ACETAMINOPHEN 325 MG PO TABS
650.0000 mg | ORAL_TABLET | Freq: Four times a day (QID) | ORAL | Status: DC | PRN
  Administered 2018-04-17: 650 mg via ORAL
  Filled 2018-04-15: qty 2

## 2018-04-15 MED ORDER — MORPHINE SULFATE (PF) 4 MG/ML IV SOLN
4.0000 mg | Freq: Once | INTRAVENOUS | Status: AC
  Administered 2018-04-15: 4 mg via INTRAVENOUS
  Filled 2018-04-15: qty 1

## 2018-04-15 MED ORDER — POTASSIUM CHLORIDE CRYS ER 20 MEQ PO TBCR
40.0000 meq | EXTENDED_RELEASE_TABLET | Freq: Once | ORAL | Status: AC
  Administered 2018-04-15: 40 meq via ORAL
  Filled 2018-04-15: qty 2

## 2018-04-15 MED ORDER — HYDROXYZINE HCL 25 MG PO TABS: 25.0000 mg | ORAL_TABLET | Freq: Three times a day (TID) | ORAL | Status: DC | PRN

## 2018-04-15 MED ORDER — NITROGLYCERIN 0.4 MG SL SUBL
0.4000 mg | SUBLINGUAL_TABLET | SUBLINGUAL | Status: DC | PRN
  Administered 2018-04-15 – 2018-04-17 (×4): 0.4 mg via SUBLINGUAL
  Filled 2018-04-15 (×3): qty 1

## 2018-04-15 MED ORDER — HYDRALAZINE HCL 20 MG/ML IJ SOLN
10.0000 mg | Freq: Four times a day (QID) | INTRAMUSCULAR | Status: DC | PRN
  Administered 2018-04-16: 10 mg via INTRAVENOUS
  Filled 2018-04-15: qty 1

## 2018-04-15 MED ORDER — IOPAMIDOL (ISOVUE-370) INJECTION 76%
100.0000 mL | Freq: Once | INTRAVENOUS | Status: AC | PRN
  Administered 2018-04-15: 100 mL via INTRAVENOUS

## 2018-04-15 MED ORDER — ADULT MULTIVITAMIN W/MINERALS CH
1.0000 | ORAL_TABLET | Freq: Every day | ORAL | Status: DC
  Administered 2018-04-16 – 2018-04-17 (×2): 1 via ORAL
  Filled 2018-04-15 (×2): qty 1

## 2018-04-15 MED ORDER — LEVOTHYROXINE SODIUM 88 MCG PO TABS
88.0000 ug | ORAL_TABLET | Freq: Every day | ORAL | Status: DC
  Administered 2018-04-16 – 2018-04-17 (×2): 88 ug via ORAL
  Filled 2018-04-15 (×2): qty 1

## 2018-04-15 MED ORDER — TRAMADOL HCL 50 MG PO TABS
50.0000 mg | ORAL_TABLET | Freq: Three times a day (TID) | ORAL | Status: DC
  Administered 2018-04-15 – 2018-04-17 (×5): 50 mg via ORAL
  Filled 2018-04-15 (×5): qty 1

## 2018-04-15 MED ORDER — ATORVASTATIN CALCIUM 80 MG PO TABS
80.0000 mg | ORAL_TABLET | Freq: Every day | ORAL | Status: DC
  Administered 2018-04-16: 80 mg via ORAL
  Filled 2018-04-15: qty 1

## 2018-04-15 MED ORDER — ENOXAPARIN SODIUM 40 MG/0.4ML ~~LOC~~ SOLN
40.0000 mg | SUBCUTANEOUS | Status: DC
  Administered 2018-04-15: 40 mg via SUBCUTANEOUS
  Filled 2018-04-15: qty 0.4

## 2018-04-15 MED ORDER — ONDANSETRON HCL 4 MG/2ML IJ SOLN
4.0000 mg | Freq: Once | INTRAMUSCULAR | Status: AC
Start: 2018-04-15 — End: 2018-04-15
  Administered 2018-04-15: 4 mg via INTRAVENOUS
  Filled 2018-04-15: qty 2

## 2018-04-15 MED ORDER — NITROGLYCERIN 2 % TD OINT
0.5000 [in_us] | TOPICAL_OINTMENT | Freq: Three times a day (TID) | TRANSDERMAL | Status: DC
  Administered 2018-04-15 – 2018-04-16 (×4): 0.5 [in_us] via TOPICAL
  Filled 2018-04-15 (×2): qty 30

## 2018-04-15 MED ORDER — ASPIRIN EC 325 MG PO TBEC
325.0000 mg | DELAYED_RELEASE_TABLET | Freq: Every day | ORAL | Status: DC
  Administered 2018-04-15 – 2018-04-16 (×2): 325 mg via ORAL
  Filled 2018-04-15 (×2): qty 1

## 2018-04-15 MED ORDER — PREGABALIN 100 MG PO CAPS
100.0000 mg | ORAL_CAPSULE | Freq: Three times a day (TID) | ORAL | Status: DC
  Administered 2018-04-15 – 2018-04-17 (×5): 100 mg via ORAL
  Filled 2018-04-15 (×5): qty 1

## 2018-04-15 NOTE — ED Notes (Signed)
Report given to [place] with [facility] and [facility]as RN at [facility] . Kept all belongings, purse, shirt, shoes, shirt, eye glasses and cell phone .

## 2018-04-15 NOTE — ED Notes (Signed)
Pt on monitor 

## 2018-04-15 NOTE — ED Provider Notes (Signed)
[facility] EMERGENCY DEPARTMENT Provider Note   CSN: [id number] Arrival date & time: 04/15/18  1400     History   Chief Complaint Chief Complaint  Patient presents with  . Chest Pain    HPI Whitney Daniel is a 62 y.o. female.  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  Patient reports that she was cooking in the kitchen around 12: 30 p.m. today and suddenly felt gradually worsening left-sided sharp and tight chest pain which radiated to her left shoulder and left side of her head.  She states that she also had some associated nausea, lightheadedness and shortness of breath.  Reports that she continues to have chest pain which is moderate in severity.  Pain has been constant and is unchanged since it began.  She also reports that she feels generally weak.  States that the chest pain is worsened with deep breathing.  No alleviating factors.  She denies history of PE/DVT, unilateral leg swelling or calf tenderness, recent surgery or immobilization, exogenous estrogen or active cancer.  She denies tobacco use.  Her maternal grandmother had a heart attack at age 59.  She denies fevers, chills, cough, abdominal pain, vomiting, numbness or focal weakness.  She states the bilateral ankles swell from time to time, but this is not new.  Past Medical History:  Diagnosis Date  . Anemia   . Cancer ([place])   . Fibromyalgia   . Hypertension   . Hypothyroidism   . Osteoarthritis   . SVD (spontaneous vaginal delivery)    x 5    Patient Active Problem List   Diagnosis Date Noted  . Fibromyalgia syndrome 03/20/2017  . Greater trochanteric bursitis of right hip 10/02/2016  . 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 Right lumbar radiculitis 07/05/2015  .  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 Current tear knee, medial meniscus 04/30/2015  . History of sarcoma of soft tissue 04/27/2015  . Arthritis of knee, degenerative 03/05/2015  . Paresthesias 10/27/2014  . 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 COLONOSCOPY    . DILATATION & CURRETTAGE/HYSTEROSCOPY WITH RESECTOCOPE N/A 07/15/2014   Procedure: DILATATION & CURETTAGE/HYSTEROSCOPY WITH RESECTOCOPE;  Surgeon: Marvene Staff, MD;  Location: [facility];  Service: Gynecology;  Laterality: N/A;  . DILATION AND CURETTAGE OF UTERUS     polyps  . right thigh surgery     cancer  . WISDOM TOOTH EXTRACTION       OB History   None      Home Medications    Prior to Admission medications   Medication Sig Start Date End Date Taking? Authorizing Provider  budesonide-formoterol (SYMBICORT) 160-4.5 MCG/ACT inhaler Inhale 2 puffs into the lungs 2 (two) times daily.    [provider]  hydrochlorothiazide (HYDRODIURIL) 25 MG tablet Take 25 mg by mouth daily.    [provider]  hydrOXYzine (ATARAX/VISTARIL) 25 MG tablet Take 25-50 mg by mouth every 8 (eight) hours as needed for itching.    [provider]  levothyroxine (SYNTHROID, LEVOTHROID) 88 MCG tablet Take 88 mcg by mouth daily before breakfast.    [provider]  Multiple Vitamin (MULTIVITAMIN WITH MINERALS) TABS Take 1 tablet  by mouth daily.    [provider]  naproxen (NAPROSYN) 250 MG tablet Take 250 mg by mouth.    [provider]  pregabalin (LYRICA) 100 MG capsule Take 1 capsule (100 mg total) by mouth 3 (three) times daily. 10/25/17   Kirsteins, Luanna Salk, MD  traMADol (ULTRAM) 50 MG tablet Take 1 tablet (50 mg total) by mouth 3 (three) times daily. 10/25/17   Kirsteins, Luanna Salk, MD    Family History Family History  Problem Relation Age of Onset  .  Hypertension Mother   . CVA Mother   . Cancer Father   . Diabetes Father   . Diabetes Sister   . Heart attack Sister     Social History Social History   Tobacco Use  . Smoking status: Never Smoker  . Smokeless tobacco: Never Used  Substance Use Topics  . Alcohol use: Yes    Alcohol/week: 0.0 standard drinks    Comment: social - wine  . Drug use: No     Allergies   Codeine and Vicodin [hydrocodone-acetaminophen]   Review of Systems Review of Systems  Constitutional: Negative for chills and fever.  HENT: Negative for congestion and sore throat.   Eyes: Negative for visual disturbance.  Respiratory: Positive for chest tightness and shortness of breath. Negative for cough and wheezing.   Cardiovascular: Positive for chest pain. Negative for leg swelling.  Gastrointestinal: Positive for nausea. Negative for abdominal pain and vomiting.  Genitourinary: Negative for difficulty urinating.  Musculoskeletal: Negative for back pain.  Skin: Negative for rash.  Neurological: Positive for weakness (generalized) and light-headedness. Negative for dizziness, syncope and numbness.     Physical Exam Updated Vital Signs BP (!) 133/101   Pulse 70   Temp 98.2 F (36.8 C) (Oral)   Resp 12   Ht 5\' 8"  (1.727 m)   Wt 95.7 kg   SpO2 100%   BMI 32.08 kg/m   Physical Exam  Constitutional: She is oriented to person, place, and time. She appears well-developed and well-nourished. No distress.  No diaphoresis. Non-toxic appearing. No acute distress.  HENT:  Head: Normocephalic and atraumatic.  Mouth/Throat: Oropharynx is clear and moist.  Eyes: Pupils are equal, round, and reactive to light. Conjunctivae are normal. Right eye exhibits no discharge. Left eye exhibits no discharge.  Neck: Normal range of motion. Neck supple. No JVD present. No tracheal deviation present.  Cardiovascular: Normal rate, regular rhythm and intact distal pulses.  No murmur heard. Pulmonary/Chest: Effort  normal and breath sounds normal. No stridor. No respiratory distress. She has no wheezes. She has no rales.  Abdominal: Soft. Bowel sounds are normal. There is no tenderness.  Musculoskeletal:  No leg swelling or calf tenderness. DP pulses 2+ bilaterally.   Neurological: She is alert and oriented to person, place, and time. Coordination normal.  Skin: Skin is warm and dry. She is not diaphoretic.  Psychiatric: She has a normal mood and affect. Her behavior is normal.  Nursing note and vitals reviewed.    ED Treatments / Results  Labs (all labs ordered are listed, but only abnormal results are displayed) Labs Reviewed  BASIC METABOLIC PANEL - Abnormal; Notable for the following components:      Result Value   Potassium 3.0 (*)    All other components within normal limits  D-DIMER, QUANTITATIVE (NOT AT [facility]) - Abnormal; Notable for the following components:   D-Dimer, Quant 1.38 (*)    All other components within normal limits  CBC  TROPONIN I    EKG EKG Interpretation  Date/Time:  Monday April 15 2018 14:04:50 EDT Ventricular Rate:  63 PR Interval:  154 QRS Duration: 100 QT Interval:  406 QTC Calculation: 415 R Axis:   -66 Text Interpretation:  Normal sinus rhythm Possible Left atrial enlargement Left anterior fascicular block Abnormal ECG When compared to prior, no significant changes seen.  no STEMI Confirmed by Antony Blackbird [id number]) on 04/15/2018 2:37:51 PM   Radiology Dg Chest 2 View  Result Date: 04/15/2018 CLINICAL DATA:  Chest pain. EXAM: CHEST - 2 VIEW COMPARISON:  05/18/2016 FINDINGS: There is new borderline cardiomegaly since the prior study. Pulmonary vascularity is normal. Lungs are clear. No effusions. No bone abnormality. IMPRESSION: New borderline cardiomegaly.  No other change since the prior study. Electronically Signed   By: Lorriane Shire M.D.   On: 04/15/2018 14:55   Ct Angio Chest Pe W And/or Wo Contrast  Result Date: 04/15/2018 CLINICAL DATA:  Chest  pain for 1 hour. EXAM: CT ANGIOGRAPHY CHEST WITH CONTRAST TECHNIQUE: Multidetector CT imaging of the chest was performed using the standard protocol during bolus administration of intravenous contrast. Multiplanar CT image reconstructions and MIPs were obtained to evaluate the vascular anatomy. CONTRAST:  145mL ISOVUE-370 IOPAMIDOL (ISOVUE-370) INJECTION 76% COMPARISON:  None. FINDINGS: Cardiovascular: --Pulmonary arteries: Contrast injection is sufficient to demonstrate satisfactory opacification of the pulmonary arteries to the segmental level. There is no pulmonary embolus. The main pulmonary artery is within normal limits for size. --Aorta: Limited opacification of the aorta due to bolus timing optimization for the pulmonary arteries. conventional 3 vessel aortic branching pattern. The aortic course and caliber are normal. There is no aortic atherosclerosis. --Heart: Normal size. No pericardial effusion. Mediastinum/Nodes: No mediastinal, hilar or axillary lymphadenopathy. The visualized thyroid and thoracic esophageal course are unremarkable. Lungs/Pleura: 5 mm nodule in the right middle lobe (series 5, image 48). No pleural effusion or pneumothorax. No focal airspace consolidation. No focal pleural abnormality. Upper Abdomen: Contrast bolus timing is not optimized for evaluation of the abdominal organs. Within this limitation, the visualized organs of the upper abdomen are normal. Musculoskeletal: No chest wall abnormality. No acute or significant osseous findings. Review of the MIP images confirms the above findings. IMPRESSION: No pulmonary embolus or other acute thoracic abnormality. Electronically Signed   By: Ulyses Jarred M.D.   On: 04/15/2018 17:02    Procedures Procedures (including critical care time)  Medications Ordered in ED Medications  nitroGLYCERIN (NITROSTAT) SL tablet 0.4 mg (0.4 mg Sublingual Given 04/15/18 1811)  iopamidol (ISOVUE-370) 76 % injection 100 mL (100 mLs Intravenous  Contrast Given 04/15/18 1631)  potassium chloride SA (K-DUR,KLOR-CON) CR tablet 40 mEq (40 mEq Oral Given 04/15/18 1657)  morphine 4 MG/ML injection 4 mg (4 mg Intravenous Given 04/15/18 1823)  ondansetron (ZOFRAN) injection 4 mg (4 mg Intravenous Given 04/15/18 1823)     Initial Impression / Assessment and Plan / ED Course  I have reviewed the triage vital signs and the nursing notes.  Pertinent labs & imaging results that were available during my care of the patient were reviewed by me and considered in my medical decision making (see chart for details).    Patient with history of hypertension, obesity and family history of ACS presents with gradual onset of chest pain which began today around 12 PM.  Pain is described as tight and was moved to the left shoulder and left head.  She has associated shortness of breath, nausea and lightheadedness. Initial troponin negative  and EKG non-ischemic. Ddimer ordered given pleuritic symptoms which is elevated at 1.38. CT angio chest negative for PE or other acute abnormality. She is hypokalemic with potassium 3.0, replaced with po potassium. No other major electrolyte abnormalities. CBC unremarkable. On recheck she continues to report chest pain. Nitroglycerin did not help. Morphine ordered.   Patient has a HEART score of 4. Plan to admit for further evaluation and ACS r/o. Do not suspect TAD given no radiation to the back, pulse deficit or neurologic symptoms. She is afebrile and nontoxic no concern for esophageal perforation. Discussed this patient with Dr. Maudie Mercury who will admit to [facility] step down. Patient informed and agrees with this plan.    Final Clinical Impressions(s) / ED Diagnoses   Final diagnoses:  None    ED Discharge Orders    None       Bernarda Caffey 04/15/18 1902    Deno Etienne, DO 04/15/18 [place], DO 04/15/18 2244

## 2018-04-15 NOTE — H&P (Addendum)
TRH H&P   Patient Demographics:    Whitney Daniel, is a 62 y.o. female  MRN: [id number]   DOB - March 25, 1956  Admit Date - 04/15/2018  Outpatient Primary MD for the patient is [name], MD  Referring MD/NP/PA:   Corwin Levins PA  Outpatient Specialists:    Patient coming from: home=> med center [facility]  Chief Complaint  Patient presents with  . Chest Pain      HPI:    Whitney Daniel  is a 62 y.o. female, w hyeprtension, hypothyroidism apparently c/o chest pain left sided, "tightness" radiating to the left arm. Starting afternoon on 8/12.  Pt noted slight sob.  Pt denies fever, chills, cough, palp, n/v, diarrhea, brbpr, black stool.  Pt was given nitor without relief in ED x3.    In Ed,   CTA chest IMPRESSION: No pulmonary embolus or other acute thoracic abnormality.  Ekg nsr at 38, lad, poor R progression t inversion in avl  Na 138, K 3.0 Bun 13, Creatinine 0.79 Wbc 6.0, Hgb 12.9, Plt 279 Trop <0.03  Pt will be admitted for chest pain and hypokalemia    Review of systems:    In addition to the HPI above,  No Fever-chills, No Headache, No changes with Vision or hearing, No problems swallowing food or Liquids, No  Cough or Shortness of Breath, No Abdominal pain, No Nausea or Vommitting, Bowel movements are regular, No Blood in stool or Urine, No dysuria, No new skin rashes or bruises, No new joints pains-aches,  No new weakness, tingling, numbness in any extremity, No recent weight gain or loss, No polyuria, polydypsia or polyphagia, No significant Mental Stressors.  A full 10 point Review of Systems was done, except as stated above, all other Review of Systems were negative.   With Past History of the following :    Past Medical History:  Diagnosis Date  . Anemia   . Cancer ([place])   . Fibromyalgia   . Hypertension   . Hypothyroidism   .  Osteoarthritis   . SVD (spontaneous vaginal delivery)    x 5      Past Surgical History:  Procedure Laterality Date  . COLONOSCOPY    . DILATATION & CURRETTAGE/HYSTEROSCOPY WITH RESECTOCOPE N/A 07/15/2014   Procedure: DILATATION & CURETTAGE/HYSTEROSCOPY WITH RESECTOCOPE;  Surgeon: Marvene Staff, MD;  Location: [facility];  Service: Gynecology;  Laterality: N/A;  . DILATION AND CURETTAGE OF UTERUS     polyps  . right thigh surgery     cancer  . WISDOM TOOTH EXTRACTION        Social History:     Social History   Tobacco Use  . Smoking status: Never Smoker  . Smokeless tobacco: Never Used  Substance Use Topics  . Alcohol use: Yes    Alcohol/week: 0.0 standard drinks    Comment: social - wine  Lives - at home  Mobility - walks by self   Family History :     Family History  Problem Relation Age of Onset  . Hypertension Mother   . CVA Mother   . Cancer Father   . Diabetes Father   . Diabetes Sister   . Heart attack Sister        Home Medications:   Prior to Admission medications   Medication Sig Start Date End Date Taking? Authorizing Provider  budesonide-formoterol (SYMBICORT) 160-4.5 MCG/ACT inhaler Inhale 2 puffs into the lungs 2 (two) times daily.    [provider]  hydrochlorothiazide (HYDRODIURIL) 25 MG tablet Take 25 mg by mouth daily.    [provider]  hydrOXYzine (ATARAX/VISTARIL) 25 MG tablet Take 25-50 mg by mouth every 8 (eight) hours as needed for itching.    [provider]  levothyroxine (SYNTHROID, LEVOTHROID) 88 MCG tablet Take 88 mcg by mouth daily before breakfast.    [provider]  Multiple Vitamin (MULTIVITAMIN WITH MINERALS) TABS Take 1 tablet by mouth daily.    [provider]  naproxen (NAPROSYN) 250 MG tablet Take 250 mg by mouth.    [provider]  pregabalin (LYRICA) 100 MG capsule Take 1 capsule (100 mg total) by mouth 3 (three) times daily. 10/25/17   Kirsteins, Luanna Salk, MD  traMADol (ULTRAM) 50 MG tablet Take 1 tablet (50 mg total) by mouth 3 (three) times daily. 10/25/17   Kirsteins, Luanna Salk, MD     Allergies:     Allergies  Allergen Reactions  . Codeine Other (See Comments)    Patient states "she did not like the way codeine made her feel"    . Vicodin [Hydrocodone-Acetaminophen] Nausea And Vomiting    Doesn't like how it makes her feel but can take if needs to for pain     Physical Exam:   Vitals  Blood pressure (!) 148/85, pulse (!) 55, temperature 98.7 F (37.1 C), temperature source Oral, resp. rate 13, height 5\' 8"  (1.727 m), weight 95.6 kg, SpO2 100 %.   1. General  lying in bed in NAD,   2. Normal affect and insight, Not Suicidal or Homicidal, Awake Alert, Oriented X 3.  3. No F.N deficits, ALL C.Nerves Intact, Strength 5/5 all 4 extremities, Sensation intact all 4 extremities, Plantars down going.  4. Ears and Eyes appear Normal, Conjunctivae clear, PERRLA. Moist Oral Mucosa.  5. Supple Neck, No JVD, No cervical lymphadenopathy appriciated, No Carotid Bruits.  6. Symmetrical Chest wall movement, Good air movement bilaterally, CTAB.  7. RRR, No Gallops, Rubs or Murmurs, No Parasternal Heave.  8. Positive Bowel Sounds, Abdomen Soft, No tenderness, No organomegaly appriciated,No rebound -guarding or rigidity.  9.  No Cyanosis, Normal Skin Turgor, No Skin Rash or Bruise.  10. Good muscle tone,  joints appear normal , no effusions, Normal ROM.  11. No Palpable Lymph Nodes in Neck or Axillae      Data Review:    CBC Recent Labs  Lab 04/15/18 1418  WBC 6.0  HGB 12.9  HCT 38.2  PLT 279  MCV 82.7  MCH 27.9  MCHC 33.8  RDW 13.9   ------------------------------------------------------------------------------------------------------------------  Chemistries  Recent Labs  Lab 04/15/18 1418  NA 138  K 3.0*  CL 103  CO2 26  GLUCOSE 88  BUN 13  CREATININE 0.79  CALCIUM 9.9    ------------------------------------------------------------------------------------------------------------------ estimated creatinine clearance is 90.4 mL/min (by C-G formula based on SCr of 0.79 mg/dL). ------------------------------------------------------------------------------------------------------------------  No results for input(s): TSH, T4TOTAL, T3FREE, THYROIDAB in the last 72 hours.  Invalid input(s): FREET3  Coagulation profile No results for input(s): INR, PROTIME in the last 168 hours. ------------------------------------------------------------------------------------------------------------------- Recent Labs    04/15/18 1419  DDIMER 1.38*   -------------------------------------------------------------------------------------------------------------------  Cardiac Enzymes Recent Labs  Lab 04/15/18 1419  TROPONINI <0.03   ------------------------------------------------------------------------------------------------------------------ No results found for: BNP   ---------------------------------------------------------------------------------------------------------------  Urinalysis    Component Value Date/Time   COLORURINE YELLOW 01/26/2017 1135   APPEARANCEUR CLOUDY (A) 01/26/2017 1135   LABSPEC 1.016 01/26/2017 1135   PHURINE 7.5 01/26/2017 1135   GLUCOSEU NEGATIVE 01/26/2017 1135   HGBUR NEGATIVE 01/26/2017 1135   BILIRUBINUR NEGATIVE 01/26/2017 1135   KETONESUR NEGATIVE 01/26/2017 1135   PROTEINUR NEGATIVE 01/26/2017 1135   UROBILINOGEN 0.2 03/19/2015 1125   NITRITE NEGATIVE 01/26/2017 1135   LEUKOCYTESUR SMALL (A) 01/26/2017 1135    ----------------------------------------------------------------------------------------------------------------   Imaging Results:    Dg Chest 2 View  Result Date: 04/15/2018 CLINICAL DATA:  Chest pain. EXAM: CHEST - 2 VIEW COMPARISON:  05/18/2016 FINDINGS: There is new borderline cardiomegaly since the  prior study. Pulmonary vascularity is normal. Lungs are clear. No effusions. No bone abnormality. IMPRESSION: New borderline cardiomegaly.  No other change since the prior study. Electronically Signed   By: Lorriane Shire M.D.   On: 04/15/2018 14:55   Ct Angio Chest Pe W And/or Wo Contrast  Result Date: 04/15/2018 CLINICAL DATA:  Chest pain for 1 hour. EXAM: CT ANGIOGRAPHY CHEST WITH CONTRAST TECHNIQUE: Multidetector CT imaging of the chest was performed using the standard protocol during bolus administration of intravenous contrast. Multiplanar CT image reconstructions and MIPs were obtained to evaluate the vascular anatomy. CONTRAST:  163mL ISOVUE-370 IOPAMIDOL (ISOVUE-370) INJECTION 76% COMPARISON:  None. FINDINGS: Cardiovascular: --Pulmonary arteries: Contrast injection is sufficient to demonstrate satisfactory opacification of the pulmonary arteries to the segmental level. There is no pulmonary embolus. The main pulmonary artery is within normal limits for size. --Aorta: Limited opacification of the aorta due to bolus timing optimization for the pulmonary arteries. conventional 3 vessel aortic branching pattern. The aortic course and caliber are normal. There is no aortic atherosclerosis. --Heart: Normal size. No pericardial effusion. Mediastinum/Nodes: No mediastinal, hilar or axillary lymphadenopathy. The visualized thyroid and thoracic esophageal course are unremarkable. Lungs/Pleura: 5 mm nodule in the right middle lobe (series 5, image 48). No pleural effusion or pneumothorax. No focal airspace consolidation. No focal pleural abnormality. Upper Abdomen: Contrast bolus timing is not optimized for evaluation of the abdominal organs. Within this limitation, the visualized organs of the upper abdomen are normal. Musculoskeletal: No chest wall abnormality. No acute or significant osseous findings. Review of the MIP images confirms the above findings. IMPRESSION: No pulmonary embolus or other acute thoracic  abnormality. Electronically Signed   By: Ulyses Jarred M.D.   On: 04/15/2018 17:02       Assessment & Plan:    Principal Problem:   Chest pain Active Problems:   Hypertension   Hypothyroidism    Chest pain Tele Trop I q6h x3 Cardiac echo Check hga1c, lipid Start Aspirin 325mg  po qday Start Lipitor 80mg  po qhs No betablocker due to bradycardia Cardiology consult by email requested  Hypokalemia Replete Check cmp in am  Hypertension Cont hydrochlorothiazide (note this can cause hypokalemia)  Hypothyroidism Cont levothyroxine 88 micrograms po qday  Asthma  ??? Cont Symbicort=> Dulera  Fibromyalgia Cont lyrica 100mg  po tid Cont Tramadol 50mg  prn   DVT Prophylaxis Lovenox - SCDs  AM Labs Ordered, also please review Full Orders  Family Communication: Admission, patients condition and plan of care including tests being ordered have been discussed with the patient  who indicate understanding and agree with the plan and Code Status.  Code Status  FULL CODE  Likely DC to  home  Condition GUARDED   Consults called: cardiology by email  Admission status: observation  Time spent in minutes : 60   Jani Gravel M.D on 04/15/2018 at 9:13 PM  Between 7am to 7pm - Pager - [phone]  . After 7pm go to [email or web address] - password [name]  [facility] - Office  [phone]

## 2018-04-15 NOTE — ED Notes (Signed)
Patient is resting comfortably, texting on phone

## 2018-04-15 NOTE — ED Notes (Addendum)
Anxious, states," Would I be able to talk if I had a heart attack" Reassurance given, showed nurse picture of granddaughter on phone., smiling when talking about her family

## 2018-04-15 NOTE — Progress Notes (Addendum)
62 yo female with hypertension with chhest pain w radiation to the left arm starting this afternoon.  D dimer +, trop negative, CTA chest negative for PE.  Ekg nsr at 67, lad, poor R progression no st-t changes c/w ischemia Pt continues to have chest pain, willl admit to stepdown obs.   Corwin Levins PA requesting admission for CP

## 2018-04-15 NOTE — ED Triage Notes (Signed)
Patient states that she has had chest pain x 1 hour  - patient states that she was cooking when she had a sharp pain that went through the center of her chest. The patient reports that she feels like her chest is pulling and stretching into her head.

## 2018-04-16 ENCOUNTER — Ambulatory Visit (HOSPITAL_COMMUNITY)
Admission: EM | Disposition: A | Discharge status: Home / Self Care | Payer: Self-pay | Source: Home / Self Care | Attending: Emergency Medicine

## 2018-04-16 ENCOUNTER — Ambulatory Visit (HOSPITAL_BASED_OUTPATIENT_CLINIC_OR_DEPARTMENT_OTHER): Payer: BLUE CROSS/BLUE SHIELD

## 2018-04-16 DIAGNOSIS — I2510 Atherosclerotic heart disease of native coronary artery without angina pectoris: Secondary | ICD-10-CM

## 2018-04-16 DIAGNOSIS — E785 Hyperlipidemia, unspecified: Secondary | ICD-10-CM | POA: Diagnosis not present

## 2018-04-16 DIAGNOSIS — R079 Chest pain, unspecified: Secondary | ICD-10-CM | POA: Diagnosis not present

## 2018-04-16 DIAGNOSIS — I10 Essential (primary) hypertension: Secondary | ICD-10-CM

## 2018-04-16 DIAGNOSIS — R7303 Prediabetes: Secondary | ICD-10-CM

## 2018-04-16 DIAGNOSIS — E039 Hypothyroidism, unspecified: Secondary | ICD-10-CM | POA: Diagnosis not present

## 2018-04-16 DIAGNOSIS — I25118 Atherosclerotic heart disease of native coronary artery with other forms of angina pectoris: Secondary | ICD-10-CM | POA: Diagnosis not present

## 2018-04-16 DIAGNOSIS — I259 Chronic ischemic heart disease, unspecified: Secondary | ICD-10-CM | POA: Diagnosis not present

## 2018-04-16 HISTORY — PX: LEFT HEART CATH AND CORONARY ANGIOGRAPHY: CATH118249

## 2018-04-16 LAB — LIPID PANEL
Cholesterol: 206 mg/dL — ABNORMAL HIGH (ref 0–200)
HDL: 46 mg/dL (ref >40)
LDL CALC: 144 mg/dL — AB (ref 0–99)
TRIGLYCERIDES: 81 mg/dL (ref <150)
Total CHOL/HDL Ratio: 4.5 RATIO
VLDL: 16 mg/dL (ref 0–40)

## 2018-04-16 LAB — COMPREHENSIVE METABOLIC PANEL
ALBUMIN: 3.6 g/dL (ref 3.5–5.0)
ALT: 15 U/L (ref 0–44)
ANION GAP: 10 (ref 5–15)
AST: 22 U/L (ref 15–41)
Alkaline Phosphatase: 65 U/L (ref 38–126)
BILIRUBIN TOTAL: 0.5 mg/dL (ref 0.3–1.2)
BUN: 10 mg/dL (ref 6–20)
CHLORIDE: 103 mmol/L (ref 98–111)
CO2: 24 mmol/L (ref 22–32)
Calcium: 9.6 mg/dL (ref 8.9–10.3)
Creatinine, Ser: 0.91 mg/dL (ref 0.44–1.00)
GFR calc Af Amer: >60 mL/min (ref >60)
GFR calc non Af Amer: >60 mL/min (ref >60)
GLUCOSE: 91 mg/dL (ref 70–99)
POTASSIUM: 3.8 mmol/L (ref 3.5–5.1)
SODIUM: 137 mmol/L (ref 135–145)
TOTAL PROTEIN: 6.8 g/dL (ref 6.5–8.1)

## 2018-04-16 LAB — CBC
HEMATOCRIT: 37.7 % (ref 36.0–46.0)
HEMOGLOBIN: 11.9 g/dL — AB (ref 12.0–15.0)
MCH: 27.3 pg (ref 26.0–34.0)
MCHC: 31.6 g/dL (ref 30.0–36.0)
MCV: 86.5 fL (ref 78.0–100.0)
Platelets: 252 10*3/uL (ref 150–400)
RBC: 4.36 MIL/uL (ref 3.87–5.11)
RDW: 14 % (ref 11.5–15.5)
WBC: 6.4 10*3/uL (ref 4.0–10.5)

## 2018-04-16 LAB — ECHOCARDIOGRAM COMPLETE
Height: 68 in
Weight: 3372.16 oz

## 2018-04-16 LAB — MRSA PCR SCREENING: MRSA by PCR: NEGATIVE

## 2018-04-16 LAB — HIV ANTIBODY (ROUTINE TESTING W REFLEX): HIV SCREEN 4TH GENERATION: NONREACTIVE

## 2018-04-16 LAB — APTT: aPTT: 36 seconds (ref 24–36)

## 2018-04-16 LAB — HEMOGLOBIN A1C
Hgb A1c MFr Bld: 6 % — ABNORMAL HIGH (ref 4.8–5.6)
MEAN PLASMA GLUCOSE: 125.5 mg/dL

## 2018-04-16 LAB — PROTIME-INR
INR: 1.06
PROTHROMBIN TIME: 13.8 s (ref 11.4–15.2)

## 2018-04-16 LAB — TROPONIN I: Troponin I: <0.03 ng/mL (ref <0.03)

## 2018-04-16 SURGERY — LEFT HEART CATH AND CORONARY ANGIOGRAPHY
Anesthesia: LOCAL

## 2018-04-16 MED ORDER — SODIUM CHLORIDE 0.9% FLUSH: 3.0000 mL | INTRAVENOUS | Status: DC | PRN

## 2018-04-16 MED ORDER — MIDAZOLAM HCL 2 MG/2ML IJ SOLN
INTRAMUSCULAR | Status: DC | PRN
  Administered 2018-04-16 (×2): 1 mg via INTRAVENOUS

## 2018-04-16 MED ORDER — HEPARIN (PORCINE) IN NACL 1000-0.9 UT/500ML-% IV SOLN
INTRAVENOUS | Status: DC | PRN
  Administered 2018-04-16 (×2): 500 mL

## 2018-04-16 MED ORDER — IOHEXOL 350 MG/ML SOLN
INTRAVENOUS | Status: DC | PRN
  Administered 2018-04-16: 65 mL via INTRA_ARTERIAL

## 2018-04-16 MED ORDER — SODIUM CHLORIDE 0.9 % WEIGHT BASED INFUSION: 1.0000 mL/kg/h | INTRAVENOUS | Status: DC

## 2018-04-16 MED ORDER — ONDANSETRON HCL 4 MG/2ML IJ SOLN
4.0000 mg | Freq: Four times a day (QID) | INTRAMUSCULAR | Status: DC | PRN
  Administered 2018-04-17: 4 mg via INTRAVENOUS
  Filled 2018-04-16: qty 2

## 2018-04-16 MED ORDER — SODIUM CHLORIDE 0.9 % IV SOLN: 250.0000 mL | INTRAVENOUS | Status: DC | PRN

## 2018-04-16 MED ORDER — SODIUM CHLORIDE 0.9% FLUSH: 3.0000 mL | Freq: Two times a day (BID) | INTRAVENOUS | Status: DC

## 2018-04-16 MED ORDER — LIDOCAINE HCL (PF) 1 % IJ SOLN
INTRAMUSCULAR | Status: DC | PRN
  Administered 2018-04-16: 2 mL via INTRADERMAL

## 2018-04-16 MED ORDER — FENTANYL CITRATE (PF) 100 MCG/2ML IJ SOLN
INTRAMUSCULAR | Status: DC | PRN
  Administered 2018-04-16: 25 ug via INTRAVENOUS
  Administered 2018-04-16: 50 ug via INTRAVENOUS

## 2018-04-16 MED ORDER — SODIUM CHLORIDE 0.9 % WEIGHT BASED INFUSION
3.0000 mL/kg/h | INTRAVENOUS | Status: DC
  Administered 2018-04-16: 3 mL/kg/h via INTRAVENOUS

## 2018-04-16 MED ORDER — ENOXAPARIN SODIUM 40 MG/0.4ML ~~LOC~~ SOLN
40.0000 mg | SUBCUTANEOUS | Status: DC
  Administered 2018-04-17: 40 mg via SUBCUTANEOUS
  Filled 2018-04-16: qty 0.4

## 2018-04-16 MED ORDER — HEPARIN SODIUM (PORCINE) 1000 UNIT/ML IJ SOLN
INTRAMUSCULAR | Status: AC
  Filled 2018-04-16: qty 1

## 2018-04-16 MED ORDER — HEPARIN (PORCINE) IN NACL 1000-0.9 UT/500ML-% IV SOLN
INTRAVENOUS | Status: AC
  Filled 2018-04-16: qty 1000

## 2018-04-16 MED ORDER — SODIUM CHLORIDE 0.9 % IV SOLN
INTRAVENOUS | Status: AC
  Administered 2018-04-16: 17:00:00 via INTRAVENOUS

## 2018-04-16 MED ORDER — ASPIRIN 81 MG PO CHEW
81.0000 mg | CHEWABLE_TABLET | Freq: Every day | ORAL | Status: DC
  Administered 2018-04-17: 81 mg via ORAL
  Filled 2018-04-16: qty 1

## 2018-04-16 MED ORDER — VERAPAMIL HCL 2.5 MG/ML IV SOLN
INTRAVENOUS | Status: AC
  Filled 2018-04-16: qty 2

## 2018-04-16 MED ORDER — HEPARIN SODIUM (PORCINE) 1000 UNIT/ML IJ SOLN
INTRAMUSCULAR | Status: DC | PRN
  Administered 2018-04-16: 5000 [IU] via INTRAVENOUS

## 2018-04-16 MED ORDER — ATORVASTATIN CALCIUM 80 MG PO TABS: 80.0000 mg | ORAL_TABLET | Freq: Every day | ORAL | Status: DC

## 2018-04-16 MED ORDER — LIDOCAINE HCL (PF) 1 % IJ SOLN
INTRAMUSCULAR | Status: AC
  Filled 2018-04-16: qty 30

## 2018-04-16 MED ORDER — ASPIRIN 81 MG PO CHEW
CHEWABLE_TABLET | ORAL | Status: AC
  Filled 2018-04-16: qty 1

## 2018-04-16 MED ORDER — ACETAMINOPHEN 325 MG PO TABS: 650.0000 mg | ORAL_TABLET | ORAL | Status: DC | PRN

## 2018-04-16 MED ORDER — MIDAZOLAM HCL 2 MG/2ML IJ SOLN
INTRAMUSCULAR | Status: AC
  Filled 2018-04-16: qty 2

## 2018-04-16 MED ORDER — FENTANYL CITRATE (PF) 100 MCG/2ML IJ SOLN
INTRAMUSCULAR | Status: AC
  Filled 2018-04-16: qty 2

## 2018-04-16 SURGICAL SUPPLY — 11 items
CATH INFINITI 5 FR JL3.5 (CATHETERS) ×2 IMPLANT
CATH INFINITI JR4 5F (CATHETERS) ×1 IMPLANT
DEVICE RAD COMP TR BAND LRG (VASCULAR PRODUCTS) ×2 IMPLANT
GLIDESHEATH SLEND A-KIT 6F 22G (SHEATH) ×1 IMPLANT
GUIDEWIRE INQWIRE 1.5J.035X260 (WIRE) ×1 IMPLANT
INQWIRE 1.5J .035X260CM (WIRE) ×2
KIT HEART LEFT (KITS) ×2 IMPLANT
PACK CARDIAC CATHETERIZATION (CUSTOM PROCEDURE TRAY) ×2 IMPLANT
SHEATH PROBE COVER 6X72 (BAG) ×1 IMPLANT
TRANSDUCER W/STOPCOCK (MISCELLANEOUS) ×2 IMPLANT
TUBING CIL FLEX 10 FLL-RA (TUBING) ×2 IMPLANT

## 2018-04-16 NOTE — Consult Note (Addendum)
Cardiology Consult    Patient ID: Whitney Daniel MRN: [id number], DOB/AGE: Jan 14, 1956   Admit date: 04/15/2018 Date of Consult: 04/16/2018  Primary Physician: [name], MD Primary Cardiologist: New Requesting Provider: Dr. Maudie Mercury Reason for Consultation: Chest pain  Whitney Daniel is a 62 y.o. female who is being seen today for the evaluation of chest pain at the request of Dr. Maudie Mercury.   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  Past Medical History   Past Medical History:  Diagnosis Date  . Anemia   . Cancer ([facility])   . Fibromyalgia   . Hypertension   . Hypothyroidism   . Osteoarthritis   . SVD (spontaneous vaginal delivery)    x 5    Past Surgical History:  Procedure Laterality Date  . COLONOSCOPY    . DILATATION & CURRETTAGE/HYSTEROSCOPY WITH RESECTOCOPE N/A 07/15/2014   Procedure: DILATATION & CURETTAGE/HYSTEROSCOPY WITH RESECTOCOPE;  Surgeon: Marvene Staff, MD;  Location: [facility];  Service: Gynecology;  Laterality: N/A;  . DILATION AND CURETTAGE OF UTERUS     polyps  . right thigh surgery     cancer  . WISDOM TOOTH EXTRACTION       Allergies  Allergies  Allergen Reactions  . Codeine Other (See Comments)    Patient states "she did not like the way codeine made her feel"    . 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  Reports  most of her care has been through [facility] secondary to her history of fibromyxoid sarcoma of the right hip where she underwent a resection, and hip arthroplasty in 2011.  States she is followed by her PCP for her other chronic issues.  States she has not been diabetic, but recently instructed by her PCP that she was prediabetic and plan is to treat with diet and exercise currently.  She is a non-smoker.  Does report strong family history of CAD with one sister having reported congestive heart failure, and another having multiple MIs in her late 43s, and deceased.  Patient reports she is normally active around her home, cooks and cleans and does not generally have any exertional symptoms.  Does note over the past couple weeks she has had some increasing shortness of breath with day-to-day activity and some mild chest discomfort.  States yesterday she was in the kitchen cooking whenever she experienced a sudden onset of centralized chest pain with radiation up into her left jaw and head and down her left arm.  States symptoms were pretty intense, and she also felt short of breath and developed a headache.  Did sit down and noted her blood pressure was elevated when checked.  Talked with a friend who encouraged her to call EMS for further evaluation.  She did eventually decide to call when symptoms persisted.  Reports she was given sublingual nitroglycerin and IV morphine with improvement in her  symptoms.  In the ED her labs showed stable electrolytes, hemoglobin 11.9, troponin negative x3, creatinine 0.91, hemoglobin A1c 6.0.  Her d-dimer was elevated at 1.38 and underwent a CT angios which was negative for PE.  EKG showed sinus rhythm with no acute ST/T wave abnormalities.  Chest x-ray was negative.  Given her symptoms she was transferred to [facility] from [facility] for further work-up.   Inpatient Medications    . aspirin EC  325 mg Oral Daily  . atorvastatin  80 mg Oral q1800  . enoxaparin  (LOVENOX) injection  40 mg Subcutaneous Q24H  . hydrochlorothiazide  25 mg Oral Daily  . levothyroxine  88 mcg Oral QAC breakfast  . mometasone-formoterol  2 puff Inhalation BID  . multivitamin with minerals  1 tablet Oral Daily  . nitroGLYCERIN  0.5 inch Topical Q8H  . pregabalin  100 mg Oral TID  . traMADol  50 mg Oral TID    Family History    Family History  Problem Relation Age of Onset  . Hypertension Mother   . CVA Mother   . Cancer Father   . Diabetes Father   . Diabetes Sister   . Heart attack Sister     Social History    Social History   Socioeconomic History  . Marital status: Married    Spouse name: Not on file  . Number of children: Not on file  . Years of education: Not on file  . Highest education level: Not on file  Occupational History  . Not on file  Social Needs  . Financial resource strain: Not on file  . Food insecurity:    Worry: Not on file    Inability: Not on file  . Transportation needs:    Medical: Not on file    Non-medical: Not on file  Tobacco Use  . Smoking status: Never Smoker  . Smokeless tobacco: Never Used  Substance and Sexual Activity  . Alcohol use: Yes    Alcohol/week: 0.0 standard drinks    Comment: social - wine  . Drug use: No  . Sexual activity: Yes    Birth control/protection: Post-menopausal  Lifestyle  . Physical activity:    Days per week: Not on file    Minutes per session: Not on file  . Stress: Not on file  Relationships  . Social connections:    Talks on phone: Not on file    Gets together: Not on file    Attends religious service: Not on file    Active member of club or organization: Not on file    Attends meetings of clubs or organizations: Not on file    Relationship status: Not on file  . Intimate partner violence:    Fear of current or ex partner: Not on file    Emotionally abused: Not on file    Physically abused: Not on file    Forced sexual activity: Not on file  Other Topics Concern  .  Not on file  Social History Narrative   Lives alone in a one story home.  Has 5 children.  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      Review of Systems    See HPI  All other systems reviewed and are otherwise negative except as noted above.  Physical Exam    Blood pressure (!) 161/93, pulse (!) 56, temperature 97.9 F (  36.6 C), temperature source Oral, resp. rate 15, height 5\' 8"  (1.727 m), weight 95.6 kg, SpO2 100 %.  General: Pleasant, AAF, NAD Psych: Normal affect. Neuro: Alert and oriented X 3. Moves all extremities spontaneously. HEENT: Normal  Neck: Supple without bruits or JVD. Lungs:  Resp regular and unlabored, CTA. Heart: RRR no murmurs. Abdomen: Soft, non-tender, non-distended, BS + x 4.  Extremities: No clubbing, cyanosis or edema. DP/PT/Radials 2+ and equal bilaterally.  Labs    Troponin (Point of Care Test) No results for input(s): TROPIPOC in the last 72 hours. Recent Labs    04/15/18 1419 04/16/18 0024 04/16/18 0605 04/16/18 1134  TROPONINI <0.03 <0.03 <0.03 <0.03   Lab Results  Component Value Date   WBC 6.4 04/16/2018   HGB 11.9 (L) 04/16/2018   HCT 37.7 04/16/2018   MCV 86.5 04/16/2018   PLT 252 04/16/2018    Recent Labs  Lab 04/16/18 0605  NA 137  K 3.8  CL 103  CO2 24  BUN 10  CREATININE 0.91  CALCIUM 9.6  PROT 6.8  BILITOT 0.5  ALKPHOS 65  ALT 15  AST 22  GLUCOSE 91   Lab Results  Component Value Date   CHOL 206 (H) 04/16/2018   HDL 46 04/16/2018   LDLCALC 144 (H) 04/16/2018   TRIG 81 04/16/2018   Lab Results  Component Value Date   DDIMER 1.38 (H) 04/15/2018     Radiology Studies    Dg Chest 2 View  Result Date: 04/15/2018 CLINICAL DATA:  Chest pain. EXAM: CHEST - 2 VIEW COMPARISON:  05/18/2016 FINDINGS: There is new borderline cardiomegaly since the prior study. Pulmonary vascularity is normal. Lungs are clear. No  effusions. No bone abnormality. IMPRESSION: New borderline cardiomegaly.  No other change since the prior study. Electronically Signed   By: Lorriane Shire M.D.   On: 04/15/2018 14:55   Ct Angio Chest Pe W And/or Wo Contrast  Result Date: 04/15/2018 CLINICAL DATA:  Chest pain for 1 hour. EXAM: CT ANGIOGRAPHY CHEST WITH CONTRAST TECHNIQUE: Multidetector CT imaging of the chest was performed using the standard protocol during bolus administration of intravenous contrast. Multiplanar CT image reconstructions and MIPs were obtained to evaluate the vascular anatomy. CONTRAST:  163mL ISOVUE-370 IOPAMIDOL (ISOVUE-370) INJECTION 76% COMPARISON:  None. FINDINGS: Cardiovascular: --Pulmonary arteries: Contrast injection is sufficient to demonstrate satisfactory opacification of the pulmonary arteries to the segmental level. There is no pulmonary embolus. The main pulmonary artery is within normal limits for size. --Aorta: Limited opacification of the aorta due to bolus timing optimization for the pulmonary arteries. conventional 3 vessel aortic branching pattern. The aortic course and caliber are normal. There is no aortic atherosclerosis. --Heart: Normal size. No pericardial effusion. Mediastinum/Nodes: No mediastinal, hilar or axillary lymphadenopathy. The visualized thyroid and thoracic esophageal course are unremarkable. Lungs/Pleura: 5 mm nodule in the right middle lobe (series 5, image 48). No pleural effusion or pneumothorax. No focal airspace consolidation. No focal pleural abnormality. Upper Abdomen: Contrast bolus timing is not optimized for evaluation of the abdominal organs. Within this limitation, the visualized organs of the upper abdomen are normal. Musculoskeletal: No chest wall abnormality. No acute or significant osseous findings. Review of the MIP images confirms the above findings. IMPRESSION: No pulmonary embolus or other acute thoracic abnormality. Electronically Signed   By: Ulyses Jarred M.D.   On:  04/15/2018 17:02   US Abdomen Limited Ruq  Result Date: 03/19/2018 CLINICAL DATA:  62 year old female with advanced hepatic fibrosis. Initial encounter. EXAM:  ULTRASOUND ABDOMEN LIMITED RIGHT UPPER QUADRANT COMPARISON:  12/07/2016 ultrasound. FINDINGS: Gallbladder: No gallstones or wall thickening visualized. No sonographic Murphy sign noted by sonographer. Common bile duct: Diameter: 4 mm Liver: Liver of slight increased echogenicity consistent with mild fatty infiltration and/or hepatocellular disease. No focal hepatic lesion. Portal vein is patent on color Doppler imaging with normal direction of blood flow towards the liver. IMPRESSION: Liver of slight increased echogenicity consistent with mild fatty infiltration and/or hepatocellular disease. No focal hepatic lesion. Otherwise negative right upper quadrant ultrasound. Electronically Signed   By: Genia Del M.D.   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  1. Chest pain: reports intermittent episodes of chest pain over the past couple of weeks, but sudden onset of chest pain yesterday afternoon while making lunch. Started in her chest and radiated into the left jaw and arm. Symptoms persisted and she presented to the ED. Does have CRFs including HTN, pre diabetes, HL and strong family hx of early CAD. trops neg x3. EKG non acute. Given her family hx of early CAD, and CRFs seems best approach is to proceed with cardiac cath. Patient is agreeable to this plan.  -- The patient understands that risks included but are not limited to stroke (1 in 1000), death (1 in 59), kidney failure [usually  temporary] (1 in 500), bleeding (1 in 200), allergic reaction [possibly serious] (1 in 200).   2. HTN: blood pressures have been elevated throughout admission. Only on HCTZ, would consider adding ACEi/ARB given her prediabetic diagnosis.  -- no room for BB given her bradycardia  3. HL: hx of the same many years ago and was on medication after being on birth control, but stopped after she come off birth control medications. LDL 144 on admission. High dose statin started.   4. Low grade fibromyxoid sarcoma of the right hip: Followed through [facility], NP-C Pager [phone] 04/16/2018, 1:08 PM    Patient seen and examined. Agree with assessment and plan.  Whitney Daniel is a very pleasant 62 year old African-American female who has a history of hypertension, pre-diabetes, hypothyroidism, history of hyperlipidemia remotely on statins but discontinued by her PCP several years ago, and has a history of low-grade 5 problem myxoid sarcoma of her right hip for which she underwent resection.  She has a family history for premature coronary disease in family members the sister having had MIs in her 68s and ultimately died at around 35 and another sister and her early 70s who has had issues with subsequent heart failure development.  Patient states that yesterday experience a substernal chest tightness while cooking which she described as almost a rubber band around her chest this was associated with radiation to her left shoulder, down her arm, and also to her left side of her head.  She ultimately brought to the ER by EMS.  Her chest pain improved following nitroglycerin and morphine but is uncertain as to if it was immediately relieved with the nitroglycerin alone.  Presently, he still notes a vague chest sensation.  On exam she is in no acute distress.  Her blood  pressure has been labile ranging from 962 up to 952 systolic.  She is bradycardic in the 50s.  She is mildly obese.  HEENT  is unremarkable.  Carotids are equal bilaterally without bruits.  Lungs are clear.  There is no chest wall tenderness to palpation.  Rhythm is regular with no S3 or S4 gallop.  She has moderate central adiposity.  Pulses are 2+.  There is no clubbing cyanosis or edema.  Homans sign is negative.  Neurologic exam is grossly nonfocal.  Laboratory stable except glucose was 125.  Troponin was negative.  Lipids are increased with an LDL of 144.  CBC is normal.  D-dimer was mildly increased to 1.38 and CT chest was negative for PE or other acute thoracic abnormality.  5 mm nodule in the right middle lobe was noted.  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  I had a long discussion with the patient.  I am concerned with her significant chest tightness symptomatology as well as her significant premature cardiac history in 2 of her sisters.  Discussed noninvasive versus definitive evaluation with cardiac catheterization with her in detail.  After much discussion, I have recommended definitive cardiac catheterization. I have reviewed the risks, indications, and alternatives to cardiac catheterization, possible angioplasty, and stenting with the patient. Risks include but are not limited to bleeding, infection, vascular injury, stroke, myocardial infection, arrhythmia, kidney injury, radiation-related injury in the case of prolonged fluoroscopy use, emergency cardiac surgery, and death. The patient understands the risks of serious complication is 1-2 in 8413 with diagnostic cardiac cath and 1-2% or less with angioplasty/stenting.  The patient has not had anything to eat.  We will plan for this catheterization procedure later this afternoon.   Troy Sine, MD, [facility] 04/16/2018 2:35 PM

## 2018-04-16 NOTE — Progress Notes (Signed)
[name] - Stepdown/ICU TEAM  Whitney Daniel  TFT:[id number] DOB: [birth year] DOA: 04/15/2018 PCP: [name], MD    Brief Narrative:  62 y.o. F w/ a hx of HTN and  hypothyroidism who presented w/ c/o chest pain left sided, "tightness" radiating to the left arm along w/ slight sob.    In the ED CTa revealed no pulmonary embolus or other acute thoracic abnormality. EKG noted nsr at 60, poor R progression and t inversion in avl.    Significant Events: 8/12 admit   Subjective: Resting comfortably w/o chest pain at the time of my visit.  Denies sob, n/v, or abdom pain.    Assessment & Plan:  Chest pain Ruled out for MI - Cards to eval w/ cardiac cath this afternoon  Hypokalemia  Corrected w/ supplementation  HLD LDL 144 - Lipitor has been initiated   HTN BP presently not at goal, but pt is also anxius about cath - follow w/o change in tx now and re-assess post-cath   Hypothryoidism Cont home synthroid dose  Low grade fibromyxoid sarcoma of the right hip Followed at [facility]    DVT prophylaxis: lovenox  Code Status: FULL CODE Family Communication: no family present at time of exam  Disposition Plan: possible d/c home 8/14 if cardiac cath w/o signif findings   Consultants:  [facility]   Antimicrobials:  none  Objective: Blood pressure (!) 161/93, pulse (!) 56, temperature 97.9 F (36.6 C), temperature source Oral, resp. rate 15, height 5\' 8"  (1.727 m), weight 95.6 kg, SpO2 100 %.  Intake/Output Summary (Last 24 hours) at 04/16/2018 1420 Last data filed at 04/16/2018 1300 Gross per 24 hour  Intake 765 ml  Output 1300 ml  Net -535 ml   Filed Weights   04/15/18 1408 04/15/18 2103 04/16/18 0431  Weight: 95.7 kg 95.6 kg 95.6 kg    Examination: General: No acute respiratory distress Lungs: Clear to auscultation bilaterally without wheezes or crackles Cardiovascular: Regular rate and rhythm without murmur gallop or rub normal S1 and S2 Abdomen:  Nontender, nondistended, soft, bowel sounds positive, no rebound, no ascites, no appreciable mass Extremities: No significant cyanosis, clubbing, or edema bilateral lower extremities  CBC: Recent Labs  Lab 04/15/18 1418 04/16/18 0605  WBC 6.0 6.4  HGB 12.9 11.9*  HCT 38.2 37.7  MCV 82.7 86.5  PLT 279 706   Basic Metabolic Panel: Recent Labs  Lab 04/15/18 1418 04/16/18 0605  NA 138 137  K 3.0* 3.8  CL 103 103  CO2 26 24  GLUCOSE 88 91  BUN 13 10  CREATININE 0.79 0.91  CALCIUM 9.9 9.6   GFR: Estimated Creatinine Clearance: 79.5 mL/min (by C-G formula based on SCr of 0.91 mg/dL).  Liver Function Tests: Recent Labs  Lab 04/16/18 0605  AST 22  ALT 15  ALKPHOS 65  BILITOT 0.5  PROT 6.8  ALBUMIN 3.6    Coagulation Profile: Recent Labs  Lab 04/16/18 0605  INR 1.06    Cardiac Enzymes: Recent Labs  Lab 04/15/18 1419 04/16/18 0024 04/16/18 0605 04/16/18 1134  TROPONINI <0.03 <0.03 <0.03 <0.03    HbA1C: Hgb A1c MFr Bld  Date/Time Value Ref Range Status  04/16/2018 12:24 AM 6.0 (H) 4.8 - 5.6 % Final    Comment:    (NOTE) Pre diabetes:          5.7%-6.4% Diabetes:              >6.4% Glycemic control for   <7.0%  adults with diabetes      Recent Results (from the past 240 hour(s))  MRSA PCR Screening     Status: None   Collection Time: 04/15/18  9:03 PM  Result Value Ref Range Status   MRSA by PCR NEGATIVE NEGATIVE Final    Comment:        The GeneXpert MRSA Assay (FDA approved for NASAL specimens only), is one component of a comprehensive MRSA colonization surveillance program. It is not intended to diagnose MRSA infection nor to guide or monitor treatment for MRSA infections. Performed at [facility], [place] [address], [place], [place] [address]      Scheduled Meds: . aspirin EC  325 mg Oral Daily  . atorvastatin  80 mg Oral q1800  . enoxaparin (LOVENOX) injection  40 mg Subcutaneous Q24H  . hydrochlorothiazide  25 mg Oral Daily    . levothyroxine  88 mcg Oral QAC breakfast  . mometasone-formoterol  2 puff Inhalation BID  . multivitamin with minerals  1 tablet Oral Daily  . nitroGLYCERIN  0.5 inch Topical Q8H  . pregabalin  100 mg Oral TID  . sodium chloride flush  3 mL Intravenous Q12H  . traMADol  50 mg Oral TID   Continuous Infusions: . sodium chloride    . sodium chloride     Followed by  . sodium chloride       LOS: 0 days   Cherene Altes, MD [facility] Office  [phone] Pager - Text Page per Amion  If 7PM-7AM, please contact night-coverage per Amion 04/16/2018, 2:20 PM

## 2018-04-16 NOTE — H&P (View-Only) (Signed)
Cardiology Consult    Patient ID: Whitney Daniel MRN: [id number], DOB/AGE: [birth year]   Admit date: 04/15/2018 Date of Consult: 04/16/2018  Primary Physician: [name], MD Primary Cardiologist: New Requesting Provider: Dr. Maudie Mercury Reason for Consultation: Chest pain  Whitney Daniel is a 62 y.o. female who is being seen today for the evaluation of chest pain at the request of Dr. Maudie Mercury.   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  Past Medical History   Past Medical History:  Diagnosis Date  . Anemia   . Cancer ([place])   . Fibromyalgia   . Hypertension   . Hypothyroidism   . Osteoarthritis   . SVD (spontaneous vaginal delivery)    x 5    Past Surgical History:  Procedure Laterality Date  . COLONOSCOPY    . DILATATION & CURRETTAGE/HYSTEROSCOPY WITH RESECTOCOPE N/A 07/15/2014   Procedure: DILATATION & CURETTAGE/HYSTEROSCOPY WITH RESECTOCOPE;  Surgeon: Marvene Staff, MD;  Location: [facility];  Service: Gynecology;  Laterality: N/A;  . DILATION AND CURETTAGE OF UTERUS     polyps  . right thigh surgery     cancer  . WISDOM TOOTH EXTRACTION       Allergies  Allergies  Allergen Reactions  . Codeine Other (See Comments)    Patient states "she did not like the way codeine made her feel"    . 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  Reports  most of her care has been through [facility] secondary to her history of fibromyxoid sarcoma of the right hip where she underwent a resection, and hip arthroplasty in 2011.  States she is followed by her PCP for her other chronic issues.  States she has not been diabetic, but recently instructed by her PCP that she was prediabetic and plan is to treat with diet and exercise currently.  She is a non-smoker.  Does report strong family history of CAD with one sister having reported congestive heart failure, and another having multiple MIs in her late 41s, and deceased.  Patient reports she is normally active around her home, cooks and cleans and does not generally have any exertional symptoms.  Does note over the past couple weeks she has had some increasing shortness of breath with day-to-day activity and some mild chest discomfort.  States yesterday she was in the kitchen cooking whenever she experienced a sudden onset of centralized chest pain with radiation up into her left jaw and head and down her left arm.  States symptoms were pretty intense, and she also felt short of breath and developed a headache.  Did sit down and noted her blood pressure was elevated when checked.  Talked with a friend who encouraged her to call EMS for further evaluation.  She did eventually decide to call when symptoms persisted.  Reports she was given sublingual nitroglycerin and IV morphine with improvement in her  symptoms.  In the ED her labs showed stable electrolytes, hemoglobin 11.9, troponin negative x3, creatinine 0.91, hemoglobin A1c 6.0.  Her d-dimer was elevated at 1.38 and underwent a CT angios which was negative for PE.  EKG showed sinus rhythm with no acute ST/T wave abnormalities.  Chest x-ray was negative.  Given her symptoms she was transferred to [facility] from [facility] for further work-up.   Inpatient Medications    . aspirin EC  325 mg Oral Daily  . atorvastatin  80 mg Oral q1800  . enoxaparin  (LOVENOX) injection  40 mg Subcutaneous Q24H  . hydrochlorothiazide  25 mg Oral Daily  . levothyroxine  88 mcg Oral QAC breakfast  . mometasone-formoterol  2 puff Inhalation BID  . multivitamin with minerals  1 tablet Oral Daily  . nitroGLYCERIN  0.5 inch Topical Q8H  . pregabalin  100 mg Oral TID  . traMADol  50 mg Oral TID    Family History    Family History  Problem Relation Age of Onset  . Hypertension Mother   . CVA Mother   . Cancer Father   . Diabetes Father   . Diabetes Sister   . Heart attack Sister     Social History    Social History   Socioeconomic History  . Marital status: Married    Spouse name: Not on file  . Number of children: Not on file  . Years of education: Not on file  . Highest education level: Not on file  Occupational History  . Not on file  Social Needs  . Financial resource strain: Not on file  . Food insecurity:    Worry: Not on file    Inability: Not on file  . Transportation needs:    Medical: Not on file    Non-medical: Not on file  Tobacco Use  . Smoking status: Never Smoker  . Smokeless tobacco: Never Used  Substance and Sexual Activity  . Alcohol use: Yes    Alcohol/week: 0.0 standard drinks    Comment: social - wine  . Drug use: No  . Sexual activity: Yes    Birth control/protection: Post-menopausal  Lifestyle  . Physical activity:    Days per week: Not on file    Minutes per session: Not on file  . Stress: Not on file  Relationships  . Social connections:    Talks on phone: Not on file    Gets together: Not on file    Attends religious service: Not on file    Active member of club or organization: Not on file    Attends meetings of clubs or organizations: Not on file    Relationship status: Not on file  . Intimate partner violence:    Fear of current or ex partner: Not on file    Emotionally abused: Not on file    Physically abused: Not on file    Forced sexual activity: Not on file  Other Topics Concern  .  Not on file  Social History Narrative   Lives alone in a one story home.  Has 5 children.  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      Review of Systems    See HPI  All other systems reviewed and are otherwise negative except as noted above.  Physical Exam    Blood pressure (!) 161/93, pulse (!) 56, temperature 97.9 F (  36.6 C), temperature source Oral, resp. rate 15, height 5\' 8"  (1.727 m), weight 95.6 kg, SpO2 100 %.  General: Pleasant, AAF, NAD Psych: Normal affect. Neuro: Alert and oriented X 3. Moves all extremities spontaneously. HEENT: Normal  Neck: Supple without bruits or JVD. Lungs:  Resp regular and unlabored, CTA. Heart: RRR no murmurs. Abdomen: Soft, non-tender, non-distended, BS + x 4.  Extremities: No clubbing, cyanosis or edema. DP/PT/Radials 2+ and equal bilaterally.  Labs    Troponin (Point of Care Test) No results for input(s): TROPIPOC in the last 72 hours. Recent Labs    04/15/18 1419 04/16/18 0024 04/16/18 0605 04/16/18 1134  TROPONINI <0.03 <0.03 <0.03 <0.03   Lab Results  Component Value Date   WBC 6.4 04/16/2018   HGB 11.9 (L) 04/16/2018   HCT 37.7 04/16/2018   MCV 86.5 04/16/2018   PLT 252 04/16/2018    Recent Labs  Lab 04/16/18 0605  NA 137  K 3.8  CL 103  CO2 24  BUN 10  CREATININE 0.91  CALCIUM 9.6  PROT 6.8  BILITOT 0.5  ALKPHOS 65  ALT 15  AST 22  GLUCOSE 91   Lab Results  Component Value Date   CHOL 206 (H) 04/16/2018   HDL 46 04/16/2018   LDLCALC 144 (H) 04/16/2018   TRIG 81 04/16/2018   Lab Results  Component Value Date   DDIMER 1.38 (H) 04/15/2018     Radiology Studies    Dg Chest 2 View  Result Date: 04/15/2018 CLINICAL DATA:  Chest pain. EXAM: CHEST - 2 VIEW COMPARISON:  05/18/2016 FINDINGS: There is new borderline cardiomegaly since the prior study. Pulmonary vascularity is normal. Lungs are clear. No  effusions. No bone abnormality. IMPRESSION: New borderline cardiomegaly.  No other change since the prior study. Electronically Signed   By: Lorriane Shire M.D.   On: 04/15/2018 14:55   Ct Angio Chest Pe W And/or Wo Contrast  Result Date: 04/15/2018 CLINICAL DATA:  Chest pain for 1 hour. EXAM: CT ANGIOGRAPHY CHEST WITH CONTRAST TECHNIQUE: Multidetector CT imaging of the chest was performed using the standard protocol during bolus administration of intravenous contrast. Multiplanar CT image reconstructions and MIPs were obtained to evaluate the vascular anatomy. CONTRAST:  1101mL ISOVUE-370 IOPAMIDOL (ISOVUE-370) INJECTION 76% COMPARISON:  None. FINDINGS: Cardiovascular: --Pulmonary arteries: Contrast injection is sufficient to demonstrate satisfactory opacification of the pulmonary arteries to the segmental level. There is no pulmonary embolus. The main pulmonary artery is within normal limits for size. --Aorta: Limited opacification of the aorta due to bolus timing optimization for the pulmonary arteries. conventional 3 vessel aortic branching pattern. The aortic course and caliber are normal. There is no aortic atherosclerosis. --Heart: Normal size. No pericardial effusion. Mediastinum/Nodes: No mediastinal, hilar or axillary lymphadenopathy. The visualized thyroid and thoracic esophageal course are unremarkable. Lungs/Pleura: 5 mm nodule in the right middle lobe (series 5, image 48). No pleural effusion or pneumothorax. No focal airspace consolidation. No focal pleural abnormality. Upper Abdomen: Contrast bolus timing is not optimized for evaluation of the abdominal organs. Within this limitation, the visualized organs of the upper abdomen are normal. Musculoskeletal: No chest wall abnormality. No acute or significant osseous findings. Review of the MIP images confirms the above findings. IMPRESSION: No pulmonary embolus or other acute thoracic abnormality. Electronically Signed   By: Ulyses Jarred M.D.   On:  04/15/2018 17:02   US Abdomen Limited Ruq  Result Date: 03/19/2018 CLINICAL DATA:  62 year old female with advanced hepatic fibrosis. Initial encounter. EXAM:  ULTRASOUND ABDOMEN LIMITED RIGHT UPPER QUADRANT COMPARISON:  12/07/2016 ultrasound. FINDINGS: Gallbladder: No gallstones or wall thickening visualized. No sonographic Murphy sign noted by sonographer. Common bile duct: Diameter: 4 mm Liver: Liver of slight increased echogenicity consistent with mild fatty infiltration and/or hepatocellular disease. No focal hepatic lesion. Portal vein is patent on color Doppler imaging with normal direction of blood flow towards the liver. IMPRESSION: Liver of slight increased echogenicity consistent with mild fatty infiltration and/or hepatocellular disease. No focal hepatic lesion. Otherwise negative right upper quadrant ultrasound. Electronically Signed   By: Genia Del M.D.   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  1. Chest pain: reports intermittent episodes of chest pain over the past couple of weeks, but sudden onset of chest pain yesterday afternoon while making lunch. Started in her chest and radiated into the left jaw and arm. Symptoms persisted and she presented to the ED. Does have CRFs including HTN, pre diabetes, HL and strong family hx of early CAD. trops neg x3. EKG non acute. Given her family hx of early CAD, and CRFs seems best approach is to proceed with cardiac cath. Patient is agreeable to this plan.  -- The patient understands that risks included but are not limited to stroke (1 in 1000), death (1 in 51), kidney failure [usually  temporary] (1 in 500), bleeding (1 in 200), allergic reaction [possibly serious] (1 in 200).   2. HTN: blood pressures have been elevated throughout admission. Only on HCTZ, would consider adding ACEi/ARB given her prediabetic diagnosis.  -- no room for BB given her bradycardia  3. HL: hx of the same many years ago and was on medication after being on birth control, but stopped after she come off birth control medications. LDL 144 on admission. High dose statin started.   4. Low grade fibromyxoid sarcoma of the right hip: Followed through [facility], NP-C Pager [phone] 04/16/2018, 1:08 PM    Patient seen and examined. Agree with assessment and plan.  Whitney Daniel is a very pleasant 62 year old African-American female who has a history of hypertension, pre-diabetes, hypothyroidism, history of hyperlipidemia remotely on statins but discontinued by her PCP several years ago, and has a history of low-grade 5 problem myxoid sarcoma of her right hip for which she underwent resection.  She has a family history for premature coronary disease in family members the sister having had MIs in her 29s and ultimately died at around 38 and another sister and her early 79s who has had issues with subsequent heart failure development.  Patient states that yesterday experience a substernal chest tightness while cooking which she described as almost a rubber band around her chest this was associated with radiation to her left shoulder, down her arm, and also to her left side of her head.  She ultimately brought to the ER by EMS.  Her chest pain improved following nitroglycerin and morphine but is uncertain as to if it was immediately relieved with the nitroglycerin alone.  Presently, he still notes a vague chest sensation.  On exam she is in no acute distress.  Her blood  pressure has been labile ranging from 562 up to 130 systolic.  She is bradycardic in the 50s.  She is mildly obese.  HEENT  is unremarkable.  Carotids are equal bilaterally without bruits.  Lungs are clear.  There is no chest wall tenderness to palpation.  Rhythm is regular with no S3 or S4 gallop.  She has moderate central adiposity.  Pulses are 2+.  There is no clubbing cyanosis or edema.  Homans sign is negative.  Neurologic exam is grossly nonfocal.  Laboratory stable except glucose was 125.  Troponin was negative.  Lipids are increased with an LDL of 144.  CBC is normal.  D-dimer was mildly increased to 1.38 and CT chest was negative for PE or other acute thoracic abnormality.  5 mm nodule in the right middle lobe was noted.  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  I had a long discussion with the patient.  I am concerned with her significant chest tightness symptomatology as well as her significant premature cardiac history in 2 of her sisters.  Discussed noninvasive versus definitive evaluation with cardiac catheterization with her in detail.  After much discussion, I have recommended definitive cardiac catheterization. I have reviewed the risks, indications, and alternatives to cardiac catheterization, possible angioplasty, and stenting with the patient. Risks include but are not limited to bleeding, infection, vascular injury, stroke, myocardial infection, arrhythmia, kidney injury, radiation-related injury in the case of prolonged fluoroscopy use, emergency cardiac surgery, and death. The patient understands the risks of serious complication is 1-2 in 8657 with diagnostic cardiac cath and 1-2% or less with angioplasty/stenting.  The patient has not had anything to eat.  We will plan for this catheterization procedure later this afternoon.   Troy Sine, MD, [facility] 04/16/2018 2:35 PM

## 2018-04-16 NOTE — Interval H&P Note (Signed)
Cath Lab Visit (complete for each Cath Lab visit)  Clinical Evaluation Leading to the Procedure:   ACS: No.  Non-ACS:    Anginal Classification: CCS III  Anti-ischemic medical therapy: Minimal Therapy (1 class of medications)  Non-Invasive Test Results: Low-risk stress test findings: cardiac mortality <1%/year  Prior CABG: No previous CABG      History and Physical Interval Note:  04/16/2018 3:20 PM  Allende Skene  has presented today for surgery, with the diagnosis of ua  The various methods of treatment have been discussed with the patient and family. After consideration of risks, benefits and other options for treatment, the patient has consented to  Procedure(s): LEFT HEART CATH AND CORONARY ANGIOGRAPHY (N/A) as a surgical intervention .  The patient's history has been reviewed, patient examined, no change in status, stable for surgery.  I have reviewed the patient's chart and labs.  Questions were answered to the patient's satisfaction.     Belva Crome III

## 2018-04-16 NOTE — Progress Notes (Signed)
  Echocardiogram 2D Echocardiogram has been performed.  Whitney Daniel 04/16/2018, 3:11 PM

## 2018-04-17 ENCOUNTER — Encounter (HOSPITAL_COMMUNITY): Payer: Self-pay | Admitting: Interventional Cardiology

## 2018-04-17 DIAGNOSIS — I25118 Atherosclerotic heart disease of native coronary artery with other forms of angina pectoris: Secondary | ICD-10-CM | POA: Diagnosis not present

## 2018-04-17 DIAGNOSIS — E785 Hyperlipidemia, unspecified: Secondary | ICD-10-CM

## 2018-04-17 DIAGNOSIS — I259 Chronic ischemic heart disease, unspecified: Secondary | ICD-10-CM

## 2018-04-17 DIAGNOSIS — I2510 Atherosclerotic heart disease of native coronary artery without angina pectoris: Secondary | ICD-10-CM | POA: Diagnosis not present

## 2018-04-17 DIAGNOSIS — I10 Essential (primary) hypertension: Secondary | ICD-10-CM | POA: Diagnosis not present

## 2018-04-17 DIAGNOSIS — R079 Chest pain, unspecified: Secondary | ICD-10-CM | POA: Diagnosis not present

## 2018-04-17 LAB — BASIC METABOLIC PANEL
ANION GAP: 9 (ref 5–15)
BUN: 10 mg/dL (ref 6–20)
CO2: 25 mmol/L (ref 22–32)
Calcium: 9.8 mg/dL (ref 8.9–10.3)
Chloride: 101 mmol/L (ref 98–111)
Creatinine, Ser: 0.94 mg/dL (ref 0.44–1.00)
GFR calc non Af Amer: >60 mL/min (ref >60)
GLUCOSE: 146 mg/dL — AB (ref 70–99)
POTASSIUM: 3.9 mmol/L (ref 3.5–5.1)
Sodium: 135 mmol/L (ref 135–145)

## 2018-04-17 LAB — MAGNESIUM: Magnesium: 2 mg/dL (ref 1.7–2.4)

## 2018-04-17 MED ORDER — HYDROCHLOROTHIAZIDE 12.5 MG PO TABS: 12.5000 mg | ORAL_TABLET | Freq: Every day | ORAL | 0 refills | Status: DC

## 2018-04-17 MED ORDER — ASPIRIN 81 MG PO CHEW: 81.0000 mg | CHEWABLE_TABLET | Freq: Every day | ORAL | Status: DC

## 2018-04-17 MED ORDER — ATORVASTATIN CALCIUM 80 MG PO TABS: 80.0000 mg | ORAL_TABLET | Freq: Every day | ORAL | 0 refills | Status: DC

## 2018-04-17 MED ORDER — METOPROLOL TARTRATE 25 MG PO TABS: 12.5000 mg | ORAL_TABLET | Freq: Two times a day (BID) | ORAL | 0 refills | Status: DC

## 2018-04-17 MED ORDER — PREGABALIN 100 MG PO CAPS: 100.0000 mg | ORAL_CAPSULE | Freq: Two times a day (BID) | ORAL | Status: DC | PRN

## 2018-04-17 MED ORDER — TRAMADOL HCL 50 MG PO TABS: 50.0000 mg | ORAL_TABLET | Freq: Two times a day (BID) | ORAL | Status: DC | PRN

## 2018-04-17 MED ORDER — METOPROLOL TARTRATE 12.5 MG HALF TABLET
12.5000 mg | ORAL_TABLET | Freq: Two times a day (BID) | ORAL | Status: DC
  Administered 2018-04-17: 12.5 mg via ORAL
  Filled 2018-04-17: qty 1

## 2018-04-17 MED ORDER — HYDROCHLOROTHIAZIDE 25 MG PO TABS: 12.5000 mg | ORAL_TABLET | Freq: Every day | ORAL | Status: DC

## 2018-04-17 MED ORDER — NITROGLYCERIN 0.4 MG SL SUBL: 0.4000 mg | SUBLINGUAL_TABLET | SUBLINGUAL | 0 refills | Status: DC | PRN

## 2018-04-17 MED FILL — Verapamil HCl IV Soln 2.5 MG/ML: INTRAVENOUS | Qty: 2 | Status: AC

## 2018-04-17 NOTE — Progress Notes (Signed)
Await cardiology evaluation today and if stable, plan to d/c this PM.  Eulogio Bear DO

## 2018-04-17 NOTE — Discharge Instructions (Signed)
Chest Wall Pain °Chest wall pain is pain in or around the bones and muscles of your chest. Sometimes, an injury causes this pain. Sometimes, the cause may not be known. This pain may take several weeks or longer to get better. °Follow these instructions at home: °Pay attention to any changes in your symptoms. Take these actions to help with your pain: °· Rest as told by your health care provider. °· Avoid activities that cause pain. These include any activities that use your chest muscles or your abdominal and side muscles to lift heavy items. °· If directed, apply ice to the painful area: °? Put ice in a plastic bag. °? Place a towel between your skin and the bag. °? Leave the ice on for 20 minutes, 2-3 times per day. °· Take over-the-counter and prescription medicines only as told by your health care provider. °· Do not use tobacco products, including cigarettes, chewing tobacco, and e-cigarettes. If you need help quitting, ask your health care provider. °· Keep all follow-up visits as told by your health care provider. This is important. ° °Contact a health care provider if: °· You have a fever. °· Your chest pain becomes worse. °· You have new symptoms. °Get help right away if: °· You have nausea or vomiting. °· You feel sweaty or light-headed. °· You have a cough with phlegm (sputum) or you cough up blood. °· You develop shortness of breath. °This information is not intended to replace advice given to you by your health care provider. Make sure you discuss any questions you have with your health care provider. °Document Released: 08/21/2005 Document Revised: 12/30/2015 Document Reviewed: 11/16/2014 °Elsevier Interactive Patient Education © 2018 [employer] ° °

## 2018-04-17 NOTE — Discharge Summary (Addendum)
Physician Discharge Summary  NARMEEN KERPER YBW:[id number] DOB: 07-17-1956 DOA: 04/15/2018  PCP: [name], MD  Admit date: 04/15/2018 Discharge date: 04/17/2018  Admitted From: home Discharge disposition: home   Recommendations for Outpatient Follow-Up:   1. Suspect a large component of symptoms related to anxiety 2. outpatient GI work up 3. Aggressive risk factor modification   Discharge Diagnosis:   Principal Problem:   Chest pain Active Problems:   Hypertension   Hypothyroidism    Discharge Condition: Improved.  Diet recommendation: Low sodium, heart healthy.  Carbohydrate-modified.  Wound care: None.  Code status: Full.   History of Present Illness:  Per Dr. Maudie Mercury: [place] Natarajan  is a 62 y.o. female, w hyeprtension, hypothyroidism apparently c/o chest pain left sided, "tightness" radiating to the left arm. Starting afternoon on 8/12.  Pt noted slight sob.  Pt denies fever, chills, cough, palp, n/v, diarrhea, brbpr, black stool.  Pt was given nitor without relief in ED x3.     Hospital Course by Problem:   Moderate CAD:  -s/p cath -add BB and titrate. -ASA 81 mg. -decreasing HCTZ to 12.5 mg -sl NTG.     HLD:  - atorvastatin 80 mg  -aggresive rick factor modification  HTN: decrease HCTZ ; start metoprolol and if additional med needed bor BP possibly add amlodipine as outpatient.  D dimer: CT negative for PE.  Anxiety -outpatient follow up  fibromyalgia -continue home meds  Medical Consultants:   cards  Discharge Exam:   Vitals:   04/17/18 0749 04/17/18 1155  BP: 140/76 120/74  Pulse: 62 63  Resp: 15 17  Temp: 98.3 F (36.8 C) 97.7 F (36.5 C)  SpO2: 97% 98%   Vitals:   04/17/18 0349 04/17/18 0533 04/17/18 0749 04/17/18 1155  BP: (!) 113/57  140/76 120/74  Pulse:   62 63  Resp:   15 17  Temp: 98.7 F (37.1 C)  98.3 F (36.8 C) 97.7 F (36.5 C)  TempSrc: Oral  Oral Oral  SpO2:   97% 98%  Weight:  95 kg    Height:         General exam: Anxious  The results of significant diagnostics from this hospitalization (including imaging, microbiology, ancillary and laboratory) are listed below for reference.     Procedures and Diagnostic Studies:   Dg Chest 2 View  Result Date: 04/15/2018 CLINICAL DATA:  Chest pain. EXAM: CHEST - 2 VIEW COMPARISON:  05/18/2016 FINDINGS: There is new borderline cardiomegaly since the prior study. Pulmonary vascularity is normal. Lungs are clear. No effusions. No bone abnormality. IMPRESSION: New borderline cardiomegaly.  No other change since the prior study. Electronically Signed   By: Lorriane Shire M.D.   On: 04/15/2018 14:55   Ct Angio Chest Pe W And/or Wo Contrast  Result Date: 04/15/2018 CLINICAL DATA:  Chest pain for 1 hour. EXAM: CT ANGIOGRAPHY CHEST WITH CONTRAST TECHNIQUE: Multidetector CT imaging of the chest was performed using the standard protocol during bolus administration of intravenous contrast. Multiplanar CT image reconstructions and MIPs were obtained to evaluate the vascular anatomy. CONTRAST:  172mL ISOVUE-370 IOPAMIDOL (ISOVUE-370) INJECTION 76% COMPARISON:  None. FINDINGS: Cardiovascular: --Pulmonary arteries: Contrast injection is sufficient to demonstrate satisfactory opacification of the pulmonary arteries to the segmental level. There is no pulmonary embolus. The main pulmonary artery is within normal limits for size. --Aorta: Limited opacification of the aorta due to bolus timing optimization for the pulmonary arteries. conventional 3 vessel aortic branching pattern. The  aortic course and caliber are normal. There is no aortic atherosclerosis. --Heart: Normal size. No pericardial effusion. Mediastinum/Nodes: No mediastinal, hilar or axillary lymphadenopathy. The visualized thyroid and thoracic esophageal course are unremarkable. Lungs/Pleura: 5 mm nodule in the right middle lobe (series 5, image 48). No pleural effusion or pneumothorax. No focal airspace  consolidation. No focal pleural abnormality. Upper Abdomen: Contrast bolus timing is not optimized for evaluation of the abdominal organs. Within this limitation, the visualized organs of the upper abdomen are normal. Musculoskeletal: No chest wall abnormality. No acute or significant osseous findings. Review of the MIP images confirms the above findings. IMPRESSION: No pulmonary embolus or other acute thoracic abnormality. Electronically Signed   By: Ulyses Jarred M.D.   On: 04/15/2018 17:02     Labs:   Basic Metabolic Panel: Recent Labs  Lab 04/15/18 1418 04/16/18 0605 04/17/18 0233  NA 138 137 135  K 3.0* 3.8 3.9  CL 103 103 101  CO2 26 24 25   GLUCOSE 88 91 146*  BUN 13 10 10   CREATININE 0.79 0.91 0.94  CALCIUM 9.9 9.6 9.8  MG  --   --  2.0   GFR Estimated Creatinine Clearance: 76.7 mL/min (by C-G formula based on SCr of 0.94 mg/dL). Liver Function Tests: Recent Labs  Lab 04/16/18 0605  AST 22  ALT 15  ALKPHOS 65  BILITOT 0.5  PROT 6.8  ALBUMIN 3.6   No results for input(s): LIPASE, AMYLASE in the last 168 hours. No results for input(s): AMMONIA in the last 168 hours. Coagulation profile Recent Labs  Lab 04/16/18 0605  INR 1.06    CBC: Recent Labs  Lab 04/15/18 1418 04/16/18 0605  WBC 6.0 6.4  HGB 12.9 11.9*  HCT 38.2 37.7  MCV 82.7 86.5  PLT 279 252   Cardiac Enzymes: Recent Labs  Lab 04/15/18 1419 04/16/18 0024 04/16/18 0605 04/16/18 1134  TROPONINI <0.03 <0.03 <0.03 <0.03   BNP: Invalid input(s): POCBNP CBG: No results for input(s): GLUCAP in the last 168 hours. D-Dimer Recent Labs    04/15/18 1419  DDIMER 1.38*   Hgb A1c Recent Labs    04/16/18 0024  HGBA1C 6.0*   Lipid Profile Recent Labs    04/16/18 0605  CHOL 206*  HDL 46  LDLCALC 144*  TRIG 81  CHOLHDL 4.5   Thyroid function studies No results for input(s): TSH, T4TOTAL, T3FREE, THYROIDAB in the last 72 hours.  Invalid input(s): FREET3 Anemia work up No results  for input(s): VITAMINB12, FOLATE, FERRITIN, TIBC, IRON, RETICCTPCT in the last 72 hours. Microbiology Recent Results (from the past 240 hour(s))  MRSA PCR Screening     Status: None   Collection Time: 04/15/18  9:03 PM  Result Value Ref Range Status   MRSA by PCR NEGATIVE NEGATIVE Final    Comment:        The GeneXpert MRSA Assay (FDA approved for NASAL specimens only), is one component of a comprehensive MRSA colonization surveillance program. It is not intended to diagnose MRSA infection nor to guide or monitor treatment for MRSA infections. Performed at [facility], [place] [address], [place], [place] [address]      Discharge Instructions:   Discharge Instructions    Diet - low sodium heart healthy   Complete by:  As directed    Increase activity slowly   Complete by:  As directed      Allergies as of 04/17/2018      Reactions   Codeine Other (See  Comments)   Patient states "she did not like the way codeine made her feel"     Vicodin [hydrocodone-acetaminophen] Nausea And Vomiting   Doesn't like how it makes her feel but can take if needs to for pain      Medication List    STOP taking these medications   naproxen 250 MG tablet Commonly known as:  NAPROSYN     TAKE these medications   aspirin 81 MG chewable tablet Chew 1 tablet (81 mg total) by mouth daily. Start taking on:  04/18/2018   atorvastatin 80 MG tablet Commonly known as:  LIPITOR Take 1 tablet (80 mg total) by mouth daily at 6 PM.   budesonide-formoterol 160-4.5 MCG/ACT inhaler Commonly known as:  SYMBICORT Inhale 2 puffs into the lungs daily as needed (shortness of breath or wheezing).   hydrochlorothiazide 12.5 MG tablet Commonly known as:  HYDRODIURIL Take 1 tablet (12.5 mg total) by mouth daily. Start taking on:  04/18/2018 What changed:    medication strength  how much to take   hydrOXYzine 25 MG tablet Commonly known as:  ATARAX/VISTARIL Take 25-50 mg by mouth every 8 (eight)  hours as needed for itching.   levothyroxine 88 MCG tablet Commonly known as:  SYNTHROID, LEVOTHROID Take 88 mcg by mouth daily before breakfast.   metoprolol tartrate 25 MG tablet Commonly known as:  LOPRESSOR Take 0.5 tablets (12.5 mg total) by mouth 2 (two) times daily.   multivitamin with minerals Tabs tablet Take 1 tablet by mouth daily.   nitroGLYCERIN 0.4 MG SL tablet Commonly known as:  NITROSTAT Place 1 tablet (0.4 mg total) under the tongue every 5 (five) minutes as needed for chest pain.   pregabalin 100 MG capsule Commonly known as:  LYRICA Take 1 capsule (100 mg total) by mouth 2 (two) times daily as needed (nerve pain).   traMADol 50 MG tablet Commonly known as:  ULTRAM Take 1 tablet (50 mg total) by mouth 2 (two) times daily as needed for moderate pain.      Follow-up Information    [name], MD Follow up in 1 week(s).   Specialty:  Family Medicine Contact information: [address] [address] [phone]            Time coordinating discharge: 35 min  Signed:  Geradine Girt  [facility] 04/17/2018, 3:45 PM

## 2018-04-17 NOTE — Progress Notes (Signed)
Progress Note  Patient Name: Whitney Daniel Date of Encounter: 04/17/2018  Primary Cardiologist: No primary care provider on file.  Subjective   Feeling well this morning.   Inpatient Medications    Scheduled Meds: . aspirin  81 mg Oral Daily  . atorvastatin  80 mg Oral q1800  . enoxaparin (LOVENOX) injection  40 mg Subcutaneous Q24H  . hydrochlorothiazide  25 mg Oral Daily  . levothyroxine  88 mcg Oral QAC breakfast  . mometasone-formoterol  2 puff Inhalation BID  . multivitamin with minerals  1 tablet Oral Daily  . pregabalin  100 mg Oral TID  . sodium chloride flush  3 mL Intravenous Q12H  . traMADol  50 mg Oral TID   Continuous Infusions: . sodium chloride     PRN Meds: sodium chloride, acetaminophen **OR** acetaminophen, hydrALAZINE, hydrOXYzine, morphine injection, nitroGLYCERIN, ondansetron (ZOFRAN) IV, sodium chloride flush   Vital Signs    Vitals:   04/17/18 0349 04/17/18 0533 04/17/18 0749 04/17/18 1155  BP: (!) 113/57  140/76 120/74  Pulse:   62 63  Resp:   15 17  Temp: 98.7 F (37.1 C)  98.3 F (36.8 C) 97.7 F (36.5 C)  TempSrc: Oral  Oral Oral  SpO2:   97% 98%  Weight:  95 kg    Height:        Intake/Output Summary (Last 24 hours) at 04/17/2018 1502 Last data filed at 04/17/2018 0802 Gross per 24 hour  Intake 1314.49 ml  Output 1400 ml  Net -85.51 ml   Filed Weights   04/15/18 2103 04/16/18 0431 04/17/18 0533  Weight: 95.6 kg 95.6 kg 95 kg    Telemetry    SR - Personally Reviewed  Physical Exam   General: Well developed, well nourished, AA female appearing in no acute distress. Head: Normocephalic, atraumatic.  Neck: Supple without bruits, JVD. Lungs:  Resp regular and unlabored, CTA. Heart: RRR, S1, S2, no S3, S4, or murmur; no rub. Abdomen: Soft, non-tender, non-distended with normoactive bowel sounds.  Extremities: No clubbing, cyanosis, edema. Distal pedal pulses are 2+ bilaterally. Right radial cath site stable.  Neuro:  Alert and oriented X 3. Moves all extremities spontaneously. Psych: Normal affect.  Labs    Chemistry Recent Labs  Lab 04/15/18 1418 04/16/18 0605 04/17/18 0233  NA 138 137 135  K 3.0* 3.8 3.9  CL 103 103 101  CO2 26 24 25   GLUCOSE 88 91 146*  BUN 13 10 10   CREATININE 0.79 0.91 0.94  CALCIUM 9.9 9.6 9.8  PROT  --  6.8  --   ALBUMIN  --  3.6  --   AST  --  22  --   ALT  --  15  --   ALKPHOS  --  65  --   BILITOT  --  0.5  --   GFRNONAA >60 >60 >60  GFRAA >60 >60 >60  ANIONGAP 9 10 9      Hematology Recent Labs  Lab 04/15/18 1418 04/16/18 0605  WBC 6.0 6.4  RBC 4.62 4.36  HGB 12.9 11.9*  HCT 38.2 37.7  MCV 82.7 86.5  MCH 27.9 27.3  MCHC 33.8 31.6  RDW 13.9 14.0  PLT 279 252    Cardiac Enzymes Recent Labs  Lab 04/15/18 1419 04/16/18 0024 04/16/18 0605 04/16/18 1134  TROPONINI <0.03 <0.03 <0.03 <0.03   No results for input(s): TROPIPOC in the last 168 hours.   BNPNo results for input(s): BNP, PROBNP in the last 168  hours.   DDimer  Recent Labs  Lab 04/15/18 1419  DDIMER 1.38*      Radiology    Ct Angio Chest Pe W And/or Wo Contrast  Result Date: 04/15/2018 CLINICAL DATA:  Chest pain for 1 hour. EXAM: CT ANGIOGRAPHY CHEST WITH CONTRAST TECHNIQUE: Multidetector CT imaging of the chest was performed using the standard protocol during bolus administration of intravenous contrast. Multiplanar CT image reconstructions and MIPs were obtained to evaluate the vascular anatomy. CONTRAST:  177mL ISOVUE-370 IOPAMIDOL (ISOVUE-370) INJECTION 76% COMPARISON:  None. FINDINGS: Cardiovascular: --Pulmonary arteries: Contrast injection is sufficient to demonstrate satisfactory opacification of the pulmonary arteries to the segmental level. There is no pulmonary embolus. The main pulmonary artery is within normal limits for size. --Aorta: Limited opacification of the aorta due to bolus timing optimization for the pulmonary arteries. conventional 3 vessel aortic branching  pattern. The aortic course and caliber are normal. There is no aortic atherosclerosis. --Heart: Normal size. No pericardial effusion. Mediastinum/Nodes: No mediastinal, hilar or axillary lymphadenopathy. The visualized thyroid and thoracic esophageal course are unremarkable. Lungs/Pleura: 5 mm nodule in the right middle lobe (series 5, image 48). No pleural effusion or pneumothorax. No focal airspace consolidation. No focal pleural abnormality. Upper Abdomen: Contrast bolus timing is not optimized for evaluation of the abdominal organs. Within this limitation, the visualized organs of the upper abdomen are normal. Musculoskeletal: No chest wall abnormality. No acute or significant osseous findings. Review of the MIP images confirms the above findings. IMPRESSION: No pulmonary embolus or other acute thoracic abnormality. Electronically Signed   By: Ulyses Jarred M.D.   On: 04/15/2018 17:02    Cardiac Studies   Cath: 04/16/18   Right dominant coronary system.  40 to 50% ostial RCA stenosis.  Normal left main.  Apical 70% LAD after the third diagonal branch.  The LAD and diagonals are otherwise normal.  Widely patent circumflex artery without significant obstruction.  Normal left ventricular systolic function.  EF greater than 50%.  LVEDP.  RECOMMENDATIONS:   Medical therapy for moderate coronary disease.  Symptoms could be related to the distal/apical LAD lesion.  Consider beta-blocker or long-acting nitrates if recurring episodes of chest pain.  Is also conceivable the chest pain was not cardiac in nature.  Aggressive risk factor modification: LDL less than 70, aspirin 81 mg daily, blood pressure control to 130/80 mmHg or less, hemoglobin A1c less than 70, aerobic activity as tolerated.  Recommend Aspirin 81mg  daily for moderate CAD.  TTE: 04/16/18  Study Conclusions  - Left ventricle: The cavity size was normal. Systolic function was   normal. The estimated ejection fraction was in  the range of 55%   to 60%. Wall motion was normal; there were no regional wall   motion abnormalities. Left ventricular diastolic function   parameters were normal. - Tricuspid valve: There was trivial regurgitation. - Pulmonic valve: There was trivial regurgitation. - Pulmonary arteries: Systolic pressure could not be accurately   estimated.  Patient Profile     61 y.o. 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 Underwent cath noted above.   Assessment & Plan    1. Chest pain: Troponins negative. Underwent cath with nonobstructive CAD noted. Will plan for ASA 81mg  daily along with statin. Echo with normal EF. Instructions/precautions given related to radial site.    2. HTN:  blood pressures improved.   3. HL: hx of the same many years ago and was on medication after being on birth control, but stopped after she come off birth control medications. LDL 144 on admission. High dose statin started. Would continue the same.   4. 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

## 2018-04-17 NOTE — Progress Notes (Signed)
Patient discharged home with family friend, paperwork and discharge instructions reviewed and all questions answered. Patient given pill splitter for home, instructed on how to take medications. Personal belongings returned, patient wheeled to front entrance for ride.

## 2018-04-17 NOTE — Progress Notes (Signed)
Progress Note  Patient Name: Whitney Daniel Date of Encounter: 04/17/2018  Primary Cardiologist: new, Dr. Claiborne Billings  Subjective   No chest pain.  Inpatient Medications    Scheduled Meds: . aspirin  81 mg Oral Daily  . atorvastatin  80 mg Oral q1800  . enoxaparin (LOVENOX) injection  40 mg Subcutaneous Q24H  . hydrochlorothiazide  25 mg Oral Daily  . levothyroxine  88 mcg Oral QAC breakfast  . mometasone-formoterol  2 puff Inhalation BID  . multivitamin with minerals  1 tablet Oral Daily  . pregabalin  100 mg Oral TID  . sodium chloride flush  3 mL Intravenous Q12H  . traMADol  50 mg Oral TID   Continuous Infusions: . sodium chloride     PRN Meds: sodium chloride, acetaminophen **OR** acetaminophen, hydrALAZINE, hydrOXYzine, morphine injection, nitroGLYCERIN, ondansetron (ZOFRAN) IV, sodium chloride flush   Vital Signs    Vitals:   04/17/18 0349 04/17/18 0533 04/17/18 0749 04/17/18 1155  BP: (!) 113/57  140/76 120/74  Pulse:   62 63  Resp:   15 17  Temp: 98.7 F (37.1 C)  98.3 F (36.8 C) 97.7 F (36.5 C)  TempSrc: Oral  Oral Oral  SpO2:   97% 98%  Weight:  95 kg    Height:        Intake/Output Summary (Last 24 hours) at 04/17/2018 1502 Last data filed at 04/17/2018 0802 Gross per 24 hour  Intake 1314.49 ml  Output 1400 ml  Net -85.51 ml    I/O since admission: -[place] Weights   04/15/18 2103 04/16/18 0431 04/17/18 0533  Weight: 95.6 kg 95.6 kg 95 kg    Telemetry    Sinus - Personally Reviewed  ECG    ECG (independently read by me): NSR at 63, LAHB  Physical Exam   BP 120/74 (BP Location: Left Arm)   Pulse 63   Temp 97.7 F (36.5 C) (Oral)   Resp 17   Ht 5\' 8"  (1.727 m)   Wt 95 kg   SpO2 98%   BMI 31.85 kg/m  General: Alert, oriented, no distress.  Skin: normal turgor, no rashes, warm and dry HEENT: Normocephalic, atraumatic. Pupils equal round and reactive to light; sclera anicteric; extraocular muscles intact; Nose without nasal  septal hypertrophy Mouth/Parynx benign; Mallinpatti scale Neck: No JVD, no carotid bruits; normal carotid upstroke Lungs: clear to ausculatation and percussion; no wheezing or rales Chest wall: without tenderness to palpitation Heart: PMI not displaced, RRR, s1 s2 normal, 1/6 systolic murmur, no diastolic murmur, no rubs, gallops, thrills, or heaves Abdomen: soft, nontender; no hepatosplenomehaly, BS+; abdominal aorta nontender and not dilated by palpation. Back: no CVA tenderness Pulses 2+ Musculoskeletal: full range of motion, normal strength, no joint deformities Extremities: no clubbing cyanosis or edema, Homan's sign negative  Neurologic: grossly nonfocal; Cranial nerves grossly wnl Psychologic: Normal mood and affect   Labs    Chemistry Recent Labs  Lab 04/15/18 1418 04/16/18 0605 04/17/18 0233  NA 138 137 135  K 3.0* 3.8 3.9  CL 103 103 101  CO2 26 24 25   GLUCOSE 88 91 146*  BUN 13 10 10   CREATININE 0.79 0.91 0.94  CALCIUM 9.9 9.6 9.8  PROT  --  6.8  --   ALBUMIN  --  3.6  --   AST  --  22  --   ALT  --  15  --   ALKPHOS  --  65  --   BILITOT  --  0.5  --   GFRNONAA >60 >60 >60  GFRAA >60 >60 >60  ANIONGAP 9 10 9      Hematology Recent Labs  Lab 04/15/18 1418 04/16/18 0605  WBC 6.0 6.4  RBC 4.62 4.36  HGB 12.9 11.9*  HCT 38.2 37.7  MCV 82.7 86.5  MCH 27.9 27.3  MCHC 33.8 31.6  RDW 13.9 14.0  PLT 279 252    Cardiac Enzymes Recent Labs  Lab 04/15/18 1419 04/16/18 0024 04/16/18 0605 04/16/18 1134  TROPONINI <0.03 <0.03 <0.03 <0.03   No results for input(s): TROPIPOC in the last 168 hours.   BNPNo results for input(s): BNP, PROBNP in the last 168 hours.   DDimer  Recent Labs  Lab 04/15/18 1419  DDIMER 1.38*     Lipid Panel     Component Value Date/Time   CHOL 206 (H) 04/16/2018 0605   TRIG 81 04/16/2018 0605   HDL 46 04/16/2018 0605   CHOLHDL 4.5 04/16/2018 0605   VLDL 16 04/16/2018 0605   LDLCALC 144 (H) 04/16/2018 0605     Radiology    Ct Angio Chest Pe W And/or Wo Contrast  Result Date: 04/15/2018 CLINICAL DATA:  Chest pain for 1 hour. EXAM: CT ANGIOGRAPHY CHEST WITH CONTRAST TECHNIQUE: Multidetector CT imaging of the chest was performed using the standard protocol during bolus administration of intravenous contrast. Multiplanar CT image reconstructions and MIPs were obtained to evaluate the vascular anatomy. CONTRAST:  154mL ISOVUE-370 IOPAMIDOL (ISOVUE-370) INJECTION 76% COMPARISON:  None. FINDINGS: Cardiovascular: --Pulmonary arteries: Contrast injection is sufficient to demonstrate satisfactory opacification of the pulmonary arteries to the segmental level. There is no pulmonary embolus. The main pulmonary artery is within normal limits for size. --Aorta: Limited opacification of the aorta due to bolus timing optimization for the pulmonary arteries. conventional 3 vessel aortic branching pattern. The aortic course and caliber are normal. There is no aortic atherosclerosis. --Heart: Normal size. No pericardial effusion. Mediastinum/Nodes: No mediastinal, hilar or axillary lymphadenopathy. The visualized thyroid and thoracic esophageal course are unremarkable. Lungs/Pleura: 5 mm nodule in the right middle lobe (series 5, image 48). No pleural effusion or pneumothorax. No focal airspace consolidation. No focal pleural abnormality. Upper Abdomen: Contrast bolus timing is not optimized for evaluation of the abdominal organs. Within this limitation, the visualized organs of the upper abdomen are normal. Musculoskeletal: No chest wall abnormality. No acute or significant osseous findings. Review of the MIP images confirms the above findings. IMPRESSION: No pulmonary embolus or other acute thoracic abnormality. Electronically Signed   By: Ulyses Jarred M.D.   On: 04/15/2018 17:02    Cardiac Studies    Right dominant coronary system.  40 to 50% ostial RCA stenosis.  Normal left main.  Apical 70% LAD after the third  diagonal branch.  The LAD and diagonals are otherwise normal.  Widely patent circumflex artery without significant obstruction.  Normal left ventricular systolic function.  EF greater than 50%.  LVEDP.  RECOMMENDATIONS:   Medical therapy for moderate coronary disease.  Symptoms could be related to the distal/apical LAD lesion.  Consider beta-blocker or long-acting nitrates if recurring episodes of chest pain.  Is also conceivable the chest pain was not cardiac in nature.  Aggressive risk factor modification: LDL less than 70, aspirin 81 mg daily, blood pressure control to 130/80 mmHg or less, hemoglobin A1c less than 70, aerobic activity as tolerated.  Recommend Aspirin 81mg  daily for moderate CAD.  Patient Profile     62 y.o. female admitted with new onset  chest pain worrisome for angina.  Assessment & Plan    1.  Moderate CAD: will initiate medical therapy with metoprolol 25 mg bid and titrate.  Continue ASA 81 mg. Consider decreasing HCTZ to 12.5 mg or possiblew dc if BP low.  Should give Rx for sl NTG.     2. HLD: now on atorvastatin 80 mg with LDL 144.   3. HTN: decrease HCTZ to 12.5 or possibly dc; start metoprolol and if additional med needed bor BP possibly add amlodipine as outpatient.  4. D dimer: CT negative for PE.  Will have APP arrange for f/u cardiology with me.   Signed, Troy Sine, MD, [facility] 04/17/2018, 3:02 PM

## 2018-04-23 ENCOUNTER — Ambulatory Visit: Payer: BLUE CROSS/BLUE SHIELD | Admitting: Physical Medicine & Rehabilitation

## 2018-04-24 DIAGNOSIS — E7800 Pure hypercholesterolemia, unspecified: Secondary | ICD-10-CM | POA: Diagnosis not present

## 2018-04-24 DIAGNOSIS — F419 Anxiety disorder, unspecified: Secondary | ICD-10-CM | POA: Diagnosis not present

## 2018-04-24 DIAGNOSIS — I2510 Atherosclerotic heart disease of native coronary artery without angina pectoris: Secondary | ICD-10-CM | POA: Diagnosis not present

## 2018-04-24 DIAGNOSIS — R69 Illness, unspecified: Secondary | ICD-10-CM | POA: Diagnosis not present

## 2018-04-26 ENCOUNTER — Ambulatory Visit (HOSPITAL_BASED_OUTPATIENT_CLINIC_OR_DEPARTMENT_OTHER): Payer: BLUE CROSS/BLUE SHIELD | Admitting: Physical Medicine & Rehabilitation

## 2018-04-26 ENCOUNTER — Encounter: Payer: BLUE CROSS/BLUE SHIELD | Attending: Physical Medicine & Rehabilitation

## 2018-04-26 ENCOUNTER — Encounter: Payer: Self-pay | Admitting: Physical Medicine & Rehabilitation

## 2018-04-26 ENCOUNTER — Other Ambulatory Visit: Payer: Self-pay

## 2018-04-26 VITALS — BP 128/68 | HR 73 | Ht 68.0 in | Wt 210.0 lb

## 2018-04-26 DIAGNOSIS — Z833 Family history of diabetes mellitus: Secondary | ICD-10-CM | POA: Insufficient documentation

## 2018-04-26 DIAGNOSIS — Z859 Personal history of malignant neoplasm, unspecified: Secondary | ICD-10-CM | POA: Diagnosis not present

## 2018-04-26 DIAGNOSIS — M797 Fibromyalgia: Secondary | ICD-10-CM | POA: Diagnosis not present

## 2018-04-26 DIAGNOSIS — R69 Illness, unspecified: Secondary | ICD-10-CM | POA: Diagnosis not present

## 2018-04-26 DIAGNOSIS — E039 Hypothyroidism, unspecified: Secondary | ICD-10-CM | POA: Insufficient documentation

## 2018-04-26 DIAGNOSIS — I10 Essential (primary) hypertension: Secondary | ICD-10-CM | POA: Diagnosis not present

## 2018-04-26 DIAGNOSIS — F419 Anxiety disorder, unspecified: Secondary | ICD-10-CM | POA: Diagnosis not present

## 2018-04-26 DIAGNOSIS — M5430 Sciatica, unspecified side: Secondary | ICD-10-CM | POA: Insufficient documentation

## 2018-04-26 DIAGNOSIS — M1990 Unspecified osteoarthritis, unspecified site: Secondary | ICD-10-CM | POA: Diagnosis not present

## 2018-04-26 DIAGNOSIS — Z8249 Family history of ischemic heart disease and other diseases of the circulatory system: Secondary | ICD-10-CM | POA: Insufficient documentation

## 2018-04-26 DIAGNOSIS — G8929 Other chronic pain: Secondary | ICD-10-CM | POA: Diagnosis not present

## 2018-04-26 DIAGNOSIS — Z823 Family history of stroke: Secondary | ICD-10-CM | POA: Diagnosis not present

## 2018-04-26 DIAGNOSIS — M47816 Spondylosis without myelopathy or radiculopathy, lumbar region: Secondary | ICD-10-CM | POA: Diagnosis not present

## 2018-04-26 MED ORDER — PREGABALIN 100 MG PO CAPS: 100.0000 mg | ORAL_CAPSULE | Freq: Two times a day (BID) | ORAL | 5 refills | Status: DC | PRN

## 2018-04-26 MED ORDER — TRAMADOL HCL 50 MG PO TABS: 50.0000 mg | ORAL_TABLET | Freq: Two times a day (BID) | ORAL | 5 refills | Status: AC | PRN

## 2018-04-26 MED ORDER — DIAZEPAM 5 MG PO TABS: 5.0000 mg | ORAL_TABLET | Freq: Four times a day (QID) | ORAL | 1 refills | Status: DC | PRN

## 2018-04-26 NOTE — Progress Notes (Signed)
Subjective:    Patient ID: Whitney Daniel, female    DOB: [birth year], 62 y.o.   MRN: [id number]  HPI   07/05/2017 Right lumbar L3, L4 medial branch blocks and L5 dorsal ramus injection under fluoroscopic guidance  08/02/2017 Left Lumbar L3, L4  medial branch blocks and L 5 dorsal ramus injection under fluoroscopic guidance   Joined [employer] walking biking, weights and water aerobics Pain Inventory Average Pain 8 Pain Right Now 6 My pain is aching  In the last 24 hours, has pain interfered with the following? General activity 4 Relation with others 0 Enjoyment of life 8 What TIME of day is your pain at its worst? all Sleep (in general) Poor  Pain is worse with: walking, bending, sitting and standing Pain improves with: medication Relief from Meds: 5  Mobility walk without assistance ability to climb steps?  yes do you drive?  yes  Function disabled: date disabled n/a  Neuro/Psych depression anxiety  Prior Studies Any changes since last visit?  yes Echocardiagram in hospital  Physicians involved in your care Any changes since last visit?  yes Primary care Dr. [name]   Family History  Problem Relation Age of Onset  . Hypertension Mother   . CVA Mother   . Cancer Father   . Diabetes Father   . Diabetes Sister   . Heart attack Sister    Social History   Socioeconomic History  . Marital status: Married    Spouse name: Not on file  . Number of children: Not on file  . Years of education: Not on file  . Highest education level: Not on file  Occupational History  . Not on file  Social Needs  . Financial resource strain: Not on file  . Food insecurity:    Worry: Not on file    Inability: Not on file  . Transportation needs:    Medical: Not on file    Non-medical: Not on file  Tobacco Use  . Smoking status: Never Smoker  . Smokeless tobacco: Never Used  Substance and Sexual Activity  . Alcohol use: Yes    Alcohol/week: 0.0 standard drinks   Comment: social - wine  . Drug use: No  . Sexual activity: Yes    Birth control/protection: Post-menopausal  Lifestyle  . Physical activity:    Days per week: Not on file    Minutes per session: Not on file  . Stress: Not on file  Relationships  . Social connections:    Talks on phone: Not on file    Gets together: Not on file    Attends religious service: Not on file    Active member of club or organization: Not on file    Attends meetings of clubs or organizations: Not on file    Relationship status: Not on file  Other Topics Concern  . Not on file  Social History Narrative   Lives alone in a one story home.  Has 5 children.  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    Past Surgical History:  Procedure Laterality Date  . COLONOSCOPY    . DILATATION & CURRETTAGE/HYSTEROSCOPY WITH RESECTOCOPE N/A 07/15/2014   Procedure: DILATATION & CURETTAGE/HYSTEROSCOPY WITH RESECTOCOPE;  Surgeon: Marvene Staff, MD;  Location: [facility];  Service: Gynecology;  Laterality: N/A;  . DILATION AND CURETTAGE OF UTERUS     polyps  . LEFT HEART CATH  AND CORONARY ANGIOGRAPHY N/A 04/16/2018   Procedure: LEFT HEART CATH AND CORONARY ANGIOGRAPHY;  Surgeon: Belva Crome, MD;  Location: [facility];  Service: Cardiovascular;  Laterality: N/A;  . right thigh surgery     cancer  . WISDOM TOOTH EXTRACTION     Past Medical History:  Diagnosis Date  . Anemia   . Cancer ([place])   . Fibromyalgia   . Hypertension   . Hypothyroidism   . Osteoarthritis   . SVD (spontaneous vaginal delivery)    x 5   BP 128/68   Pulse 73   Ht 5\' 8"  (1.727 m)   Wt 210 lb (95.3 kg)   SpO2 97%   BMI 31.93 kg/m   Opioid Risk Score:   Fall Risk Score:  `1  Depression screen PHQ 2/9  Depression screen [facility] 2/9 04/26/2018 06/04/2015 04/27/2015  Decreased Interest 1 3 3   Down, Depressed, Hopeless 1 1 1   PHQ - 2 Score 2  4 4   Altered sleeping - - 3  Tired, decreased energy - - 3  Change in appetite - - 2  Feeling bad or failure about yourself  - - 1  Trouble concentrating - - 1  Moving slowly or fidgety/restless - - 3  Suicidal thoughts - - 2  PHQ-9 Score - - 19  Difficult doing work/chores - - Extremely dIfficult     Review of Systems  Constitutional: Negative.   HENT: Negative.   Eyes: Negative.   Respiratory: Negative.   Cardiovascular: Negative.   Gastrointestinal: Negative.   Endocrine: Negative.   Genitourinary: Negative.   Musculoskeletal: Negative.   Skin: Negative.   Allergic/Immunologic: Negative.   Neurological: Negative.   Hematological: Negative.   Psychiatric/Behavioral: Negative.   All other systems reviewed and are negative.      Objective:   Physical Exam  Constitutional: She is oriented to person, place, and time. She appears well-developed and well-nourished. No distress.  HENT:  Head: Normocephalic and atraumatic.  Eyes: Pupils are equal, round, and reactive to light. EOM are normal.  Neck: Normal range of motion.  Musculoskeletal: Normal range of motion.  Neurological: She is alert and oriented to person, place, and time.  Skin: Skin is warm and dry. She is not diaphoretic.  Psychiatric: She has a normal mood and affect.  Nursing note and vitals reviewed. Motor strength is 5/5 bilateral hip flexor knee extensor ankle dorsiflexor Negative straight leg raise Lumbar spine is tenderness palpation along the lumbar paraspinals bilaterally. Pain with extension of the lumbar spine relieved by flexion of the lumbar spine        Assessment & Plan:  1.  Lumbar spondylosis without myelopathy she has had fairly prolonged relief with lumbar medial branch blocks.  Is been over 8 months since the last injections.  She has bilateral pain therefore we will do bilateral repeat medial branch blocks. 2.  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

## 2018-04-26 NOTE — Patient Instructions (Signed)
Keep Exercising!

## 2018-05-16 DIAGNOSIS — E7800 Pure hypercholesterolemia, unspecified: Secondary | ICD-10-CM | POA: Diagnosis not present

## 2018-05-16 DIAGNOSIS — I10 Essential (primary) hypertension: Secondary | ICD-10-CM | POA: Diagnosis not present

## 2018-05-16 DIAGNOSIS — R9431 Abnormal electrocardiogram [ECG] [EKG]: Secondary | ICD-10-CM | POA: Diagnosis not present

## 2018-05-16 DIAGNOSIS — I25118 Atherosclerotic heart disease of native coronary artery with other forms of angina pectoris: Secondary | ICD-10-CM | POA: Diagnosis not present

## 2018-05-17 ENCOUNTER — Ambulatory Visit (HOSPITAL_BASED_OUTPATIENT_CLINIC_OR_DEPARTMENT_OTHER): Payer: BLUE CROSS/BLUE SHIELD | Admitting: Physical Medicine & Rehabilitation

## 2018-05-17 ENCOUNTER — Encounter: Payer: Self-pay | Admitting: Physical Medicine & Rehabilitation

## 2018-05-17 ENCOUNTER — Other Ambulatory Visit: Payer: Self-pay

## 2018-05-17 ENCOUNTER — Encounter: Payer: BLUE CROSS/BLUE SHIELD | Attending: Physical Medicine & Rehabilitation

## 2018-05-17 VITALS — BP 129/85 | HR 58 | Ht 65.0 in | Wt 210.4 lb

## 2018-05-17 DIAGNOSIS — M1990 Unspecified osteoarthritis, unspecified site: Secondary | ICD-10-CM | POA: Insufficient documentation

## 2018-05-17 DIAGNOSIS — G894 Chronic pain syndrome: Secondary | ICD-10-CM | POA: Diagnosis not present

## 2018-05-17 DIAGNOSIS — Z5181 Encounter for therapeutic drug level monitoring: Secondary | ICD-10-CM

## 2018-05-17 DIAGNOSIS — I10 Essential (primary) hypertension: Secondary | ICD-10-CM | POA: Insufficient documentation

## 2018-05-17 DIAGNOSIS — E039 Hypothyroidism, unspecified: Secondary | ICD-10-CM | POA: Diagnosis not present

## 2018-05-17 DIAGNOSIS — Z8249 Family history of ischemic heart disease and other diseases of the circulatory system: Secondary | ICD-10-CM | POA: Insufficient documentation

## 2018-05-17 DIAGNOSIS — M5430 Sciatica, unspecified side: Secondary | ICD-10-CM | POA: Diagnosis not present

## 2018-05-17 DIAGNOSIS — G8929 Other chronic pain: Secondary | ICD-10-CM | POA: Diagnosis not present

## 2018-05-17 DIAGNOSIS — Z823 Family history of stroke: Secondary | ICD-10-CM | POA: Diagnosis not present

## 2018-05-17 DIAGNOSIS — M797 Fibromyalgia: Secondary | ICD-10-CM | POA: Insufficient documentation

## 2018-05-17 DIAGNOSIS — Z859 Personal history of malignant neoplasm, unspecified: Secondary | ICD-10-CM | POA: Diagnosis not present

## 2018-05-17 DIAGNOSIS — M47816 Spondylosis without myelopathy or radiculopathy, lumbar region: Secondary | ICD-10-CM

## 2018-05-17 DIAGNOSIS — Z79899 Other long term (current) drug therapy: Secondary | ICD-10-CM | POA: Diagnosis not present

## 2018-05-17 DIAGNOSIS — Z833 Family history of diabetes mellitus: Secondary | ICD-10-CM | POA: Diagnosis not present

## 2018-05-17 DIAGNOSIS — Z79891 Long term (current) use of opiate analgesic: Secondary | ICD-10-CM

## 2018-05-17 NOTE — Progress Notes (Signed)
  [facility]   Name: MORAYO LEVEN DOB:09-16-1955 MRN: [id number]  Date:05/17/2018  Physician: Alysia Penna, MD    Nurse/CMA: Wessling CMA  Allergies:  Allergies  Allergen Reactions  . Codeine Other (See Comments)    Patient states "she did not like the way codeine made her feel"    . Vicodin [Hydrocodone-Acetaminophen] Nausea And Vomiting    Doesn't like how it makes her feel but can take if needs to for pain    Consent Signed: Yes.    Is patient diabetic? No.  CBG today?   Pregnant: No. LMP: No LMP recorded. Patient is postmenopausal. (age 60-55)  Anticoagulants: no Anti-inflammatory: did not take asa 81 mg today Antibiotics: no Took diazepam pre injection Procedure:bilateral medial branch block lumbar 3-4-5 Position: Prone Start Time: 11:15am    End Time: 11:30am  Fluoro Time: 1:03s  RN/CMA Sports administrator CMA    Time 10:43 11:30am    BP 129/85 159/84    Pulse 58 57    Respirations 14 14    O2 Sat 98 98    S/S 6 6    Pain Level 7/10 0/10     D/C home with husband, patient A & O X 3, D/C instructions reviewed, and sits independently.

## 2018-05-17 NOTE — Patient Instructions (Signed)

## 2018-05-17 NOTE — Progress Notes (Signed)

## 2018-05-24 ENCOUNTER — Telehealth: Payer: Self-pay | Admitting: *Deleted

## 2018-05-24 LAB — TOXASSURE SELECT,+ANTIDEPR,UR

## 2018-05-24 NOTE — Telephone Encounter (Signed)
Urine drug screen for this encounter is consistent for prescribed medication 

## 2018-07-02 DIAGNOSIS — Z23 Encounter for immunization: Secondary | ICD-10-CM | POA: Diagnosis not present

## 2018-07-05 DIAGNOSIS — Z96642 Presence of left artificial hip joint: Secondary | ICD-10-CM | POA: Diagnosis not present

## 2018-07-05 DIAGNOSIS — C4921 Malignant neoplasm of connective and soft tissue of right lower limb, including hip: Secondary | ICD-10-CM | POA: Diagnosis not present

## 2018-07-05 DIAGNOSIS — C499 Malignant neoplasm of connective and soft tissue, unspecified: Secondary | ICD-10-CM | POA: Diagnosis not present

## 2018-07-18 ENCOUNTER — Encounter: Payer: BLUE CROSS/BLUE SHIELD | Admitting: Psychology

## 2018-08-03 DIAGNOSIS — R0789 Other chest pain: Secondary | ICD-10-CM | POA: Diagnosis not present

## 2018-08-03 DIAGNOSIS — Z79899 Other long term (current) drug therapy: Secondary | ICD-10-CM | POA: Diagnosis not present

## 2018-08-03 DIAGNOSIS — R69 Illness, unspecified: Secondary | ICD-10-CM | POA: Diagnosis not present

## 2018-08-03 DIAGNOSIS — I2510 Atherosclerotic heart disease of native coronary artery without angina pectoris: Secondary | ICD-10-CM | POA: Diagnosis not present

## 2018-08-03 DIAGNOSIS — F419 Anxiety disorder, unspecified: Secondary | ICD-10-CM | POA: Diagnosis not present

## 2018-08-03 DIAGNOSIS — E039 Hypothyroidism, unspecified: Secondary | ICD-10-CM | POA: Diagnosis not present

## 2018-08-03 DIAGNOSIS — R000 Tachycardia, unspecified: Secondary | ICD-10-CM | POA: Diagnosis not present

## 2018-08-03 DIAGNOSIS — Z9114 Patient's other noncompliance with medication regimen: Secondary | ICD-10-CM | POA: Diagnosis not present

## 2018-08-03 DIAGNOSIS — I4891 Unspecified atrial fibrillation: Secondary | ICD-10-CM | POA: Diagnosis not present

## 2018-08-03 DIAGNOSIS — I509 Heart failure, unspecified: Secondary | ICD-10-CM | POA: Diagnosis not present

## 2018-08-03 DIAGNOSIS — R079 Chest pain, unspecified: Secondary | ICD-10-CM | POA: Diagnosis not present

## 2018-08-03 DIAGNOSIS — Z9889 Other specified postprocedural states: Secondary | ICD-10-CM | POA: Diagnosis not present

## 2018-08-03 DIAGNOSIS — I959 Hypotension, unspecified: Secondary | ICD-10-CM | POA: Diagnosis not present

## 2018-08-03 DIAGNOSIS — I10 Essential (primary) hypertension: Secondary | ICD-10-CM | POA: Diagnosis not present

## 2018-08-04 DIAGNOSIS — I444 Left anterior fascicular block: Secondary | ICD-10-CM | POA: Diagnosis not present

## 2018-08-04 DIAGNOSIS — I4891 Unspecified atrial fibrillation: Secondary | ICD-10-CM | POA: Diagnosis not present

## 2018-08-04 DIAGNOSIS — Z7982 Long term (current) use of aspirin: Secondary | ICD-10-CM | POA: Diagnosis not present

## 2018-08-04 DIAGNOSIS — I2510 Atherosclerotic heart disease of native coronary artery without angina pectoris: Secondary | ICD-10-CM | POA: Diagnosis not present

## 2018-08-04 DIAGNOSIS — Z951 Presence of aortocoronary bypass graft: Secondary | ICD-10-CM | POA: Diagnosis not present

## 2018-08-06 DIAGNOSIS — I25118 Atherosclerotic heart disease of native coronary artery with other forms of angina pectoris: Secondary | ICD-10-CM | POA: Diagnosis not present

## 2018-08-06 DIAGNOSIS — I480 Paroxysmal atrial fibrillation: Secondary | ICD-10-CM | POA: Diagnosis not present

## 2018-08-06 DIAGNOSIS — Z1231 Encounter for screening mammogram for malignant neoplasm of breast: Secondary | ICD-10-CM | POA: Diagnosis not present

## 2018-08-06 DIAGNOSIS — E7800 Pure hypercholesterolemia, unspecified: Secondary | ICD-10-CM | POA: Diagnosis not present

## 2018-08-06 DIAGNOSIS — I10 Essential (primary) hypertension: Secondary | ICD-10-CM | POA: Diagnosis not present

## 2018-08-06 MED ORDER — HYDROCODONE-ACETAMINOPHEN 5-325 MG PO TABS
1.00 | ORAL_TABLET | ORAL | Status: DC
Start: ? — End: 2018-08-06

## 2018-08-06 MED ORDER — ATORVASTATIN CALCIUM 40 MG PO TABS
80.00 | ORAL_TABLET | ORAL | Status: DC
Start: 2018-08-05 — End: 2018-08-06

## 2018-08-06 MED ORDER — TRAMADOL HCL 50 MG PO TABS
50.00 | ORAL_TABLET | ORAL | Status: DC
Start: ? — End: 2018-08-06

## 2018-08-06 MED ORDER — ONDANSETRON HCL 4 MG/2ML IJ SOLN
4.00 | INTRAMUSCULAR | Status: DC
Start: ? — End: 2018-08-06

## 2018-08-06 MED ORDER — SODIUM CHLORIDE FLUSH 0.9 % IV SOLN
5.00 | INTRAVENOUS | Status: DC
Start: ? — End: 2018-08-06

## 2018-08-06 MED ORDER — ACETAMINOPHEN 325 MG PO TABS
650.00 | ORAL_TABLET | ORAL | Status: DC
Start: ? — End: 2018-08-06

## 2018-08-06 MED ORDER — LEVOTHYROXINE SODIUM 88 MCG PO TABS
88.00 | ORAL_TABLET | ORAL | Status: DC
Start: 2018-08-05 — End: 2018-08-06

## 2018-08-06 MED ORDER — PREGABALIN 100 MG PO CAPS
100.00 | ORAL_CAPSULE | ORAL | Status: DC
Start: ? — End: 2018-08-06

## 2018-08-06 MED ORDER — ASPIRIN 81 MG PO CHEW
81.00 | CHEWABLE_TABLET | ORAL | Status: DC
Start: 2018-08-05 — End: 2018-08-06

## 2018-08-06 MED ORDER — CHOLECALCIFEROL 25 MCG (1000 UT) PO TABS
1000.00 | ORAL_TABLET | ORAL | Status: DC
Start: 2018-08-05 — End: 2018-08-06

## 2018-08-06 MED ORDER — ENOXAPARIN SODIUM 40 MG/0.4ML ~~LOC~~ SOLN
40.00 | SUBCUTANEOUS | Status: DC
Start: 2018-08-04 — End: 2018-08-06

## 2018-08-06 MED ORDER — FLUTICASONE FUROATE-VILANTEROL 100-25 MCG/INH IN AEPB
1.00 | INHALATION_SPRAY | RESPIRATORY_TRACT | Status: DC
Start: 2018-08-05 — End: 2018-08-06

## 2018-08-06 MED ORDER — NITROGLYCERIN 0.4 MG SL SUBL
0.40 | SUBLINGUAL_TABLET | SUBLINGUAL | Status: DC
Start: ? — End: 2018-08-06

## 2018-08-06 MED ORDER — SODIUM CHLORIDE FLUSH 0.9 % IV SOLN
5.00 | INTRAVENOUS | Status: DC
Start: 2018-08-04 — End: 2018-08-06

## 2018-08-06 MED ORDER — METOPROLOL TARTRATE 25 MG PO TABS
12.50 | ORAL_TABLET | ORAL | Status: DC
Start: ? — End: 2018-08-06

## 2018-08-08 DIAGNOSIS — E7800 Pure hypercholesterolemia, unspecified: Secondary | ICD-10-CM | POA: Diagnosis not present

## 2018-08-19 DIAGNOSIS — I480 Paroxysmal atrial fibrillation: Secondary | ICD-10-CM | POA: Diagnosis not present

## 2018-08-29 DIAGNOSIS — I480 Paroxysmal atrial fibrillation: Secondary | ICD-10-CM | POA: Diagnosis not present

## 2018-08-29 DIAGNOSIS — I25118 Atherosclerotic heart disease of native coronary artery with other forms of angina pectoris: Secondary | ICD-10-CM | POA: Diagnosis not present

## 2018-08-29 DIAGNOSIS — R5383 Other fatigue: Secondary | ICD-10-CM | POA: Diagnosis not present

## 2018-08-29 DIAGNOSIS — F419 Anxiety disorder, unspecified: Secondary | ICD-10-CM | POA: Diagnosis not present

## 2018-09-03 DIAGNOSIS — I10 Essential (primary) hypertension: Secondary | ICD-10-CM | POA: Diagnosis not present

## 2018-09-03 DIAGNOSIS — I25118 Atherosclerotic heart disease of native coronary artery with other forms of angina pectoris: Secondary | ICD-10-CM | POA: Diagnosis not present

## 2018-09-03 DIAGNOSIS — I480 Paroxysmal atrial fibrillation: Secondary | ICD-10-CM | POA: Diagnosis not present

## 2018-09-03 DIAGNOSIS — E7800 Pure hypercholesterolemia, unspecified: Secondary | ICD-10-CM | POA: Diagnosis not present

## 2018-09-25 DIAGNOSIS — I4891 Unspecified atrial fibrillation: Secondary | ICD-10-CM | POA: Diagnosis not present

## 2018-09-25 DIAGNOSIS — I2510 Atherosclerotic heart disease of native coronary artery without angina pectoris: Secondary | ICD-10-CM | POA: Diagnosis not present

## 2018-09-25 DIAGNOSIS — R9431 Abnormal electrocardiogram [ECG] [EKG]: Secondary | ICD-10-CM | POA: Diagnosis not present

## 2018-09-25 DIAGNOSIS — I200 Unstable angina: Secondary | ICD-10-CM | POA: Diagnosis not present

## 2018-10-04 DIAGNOSIS — I480 Paroxysmal atrial fibrillation: Secondary | ICD-10-CM | POA: Diagnosis not present

## 2018-10-04 DIAGNOSIS — I25118 Atherosclerotic heart disease of native coronary artery with other forms of angina pectoris: Secondary | ICD-10-CM | POA: Diagnosis not present

## 2018-10-04 DIAGNOSIS — I493 Ventricular premature depolarization: Secondary | ICD-10-CM | POA: Diagnosis not present

## 2018-10-04 DIAGNOSIS — I10 Essential (primary) hypertension: Secondary | ICD-10-CM | POA: Diagnosis not present

## 2018-10-06 ENCOUNTER — Other Ambulatory Visit: Payer: Self-pay | Admitting: Cardiology

## 2018-10-06 DIAGNOSIS — I480 Paroxysmal atrial fibrillation: Secondary | ICD-10-CM

## 2018-10-06 DIAGNOSIS — I493 Ventricular premature depolarization: Secondary | ICD-10-CM

## 2018-10-17 ENCOUNTER — Ambulatory Visit: Payer: Medicare HMO | Admitting: Physical Medicine & Rehabilitation

## 2018-10-17 ENCOUNTER — Encounter: Payer: Medicare HMO | Attending: Physical Medicine & Rehabilitation

## 2018-10-18 DIAGNOSIS — E039 Hypothyroidism, unspecified: Secondary | ICD-10-CM | POA: Diagnosis not present

## 2018-10-18 DIAGNOSIS — R05 Cough: Secondary | ICD-10-CM | POA: Diagnosis not present

## 2018-10-18 DIAGNOSIS — R7303 Prediabetes: Secondary | ICD-10-CM | POA: Diagnosis not present

## 2018-10-18 DIAGNOSIS — I480 Paroxysmal atrial fibrillation: Secondary | ICD-10-CM | POA: Diagnosis not present

## 2018-10-18 DIAGNOSIS — I10 Essential (primary) hypertension: Secondary | ICD-10-CM | POA: Diagnosis not present

## 2018-10-22 ENCOUNTER — Ambulatory Visit
Admission: RE | Admit: 2018-10-22 | Discharge: 2018-10-22 | Disposition: A | Discharge status: Home / Self Care | Payer: Medicare HMO | Source: Ambulatory Visit | Attending: Family Medicine | Admitting: Family Medicine

## 2018-10-22 ENCOUNTER — Ambulatory Visit: Payer: Medicare HMO | Admitting: Physical Medicine & Rehabilitation

## 2018-10-22 ENCOUNTER — Other Ambulatory Visit: Payer: Self-pay | Admitting: Family Medicine

## 2018-10-22 DIAGNOSIS — R05 Cough: Secondary | ICD-10-CM | POA: Diagnosis not present

## 2018-10-22 DIAGNOSIS — R059 Cough, unspecified: Secondary | ICD-10-CM

## 2018-10-22 DIAGNOSIS — I10 Essential (primary) hypertension: Secondary | ICD-10-CM | POA: Diagnosis not present

## 2018-10-22 DIAGNOSIS — D649 Anemia, unspecified: Secondary | ICD-10-CM | POA: Diagnosis not present

## 2018-10-22 DIAGNOSIS — R7303 Prediabetes: Secondary | ICD-10-CM | POA: Diagnosis not present

## 2018-10-22 DIAGNOSIS — E039 Hypothyroidism, unspecified: Secondary | ICD-10-CM | POA: Diagnosis not present

## 2018-10-23 ENCOUNTER — Telehealth: Payer: Self-pay

## 2018-10-23 NOTE — Telephone Encounter (Signed)
Has missed multiple appts, she has had more than usual leniency prior to d/c

## 2018-10-23 NOTE — Telephone Encounter (Signed)
Patient states she has had some health issues since last being seen in this office and would like to speak to you about them. She states she is wanting to continue to be seen at this office with getting injections and so forth.

## 2018-10-24 NOTE — Telephone Encounter (Signed)
Discharge letter written and mailed.

## 2018-10-25 ENCOUNTER — Other Ambulatory Visit: Payer: Self-pay

## 2018-10-27 ENCOUNTER — Other Ambulatory Visit: Payer: Self-pay | Admitting: Physical Medicine & Rehabilitation

## 2018-10-29 ENCOUNTER — Ambulatory Visit: Payer: BLUE CROSS/BLUE SHIELD

## 2018-10-29 DIAGNOSIS — I493 Ventricular premature depolarization: Secondary | ICD-10-CM | POA: Diagnosis not present

## 2018-10-29 DIAGNOSIS — I480 Paroxysmal atrial fibrillation: Secondary | ICD-10-CM

## 2018-10-30 ENCOUNTER — Other Ambulatory Visit: Payer: Self-pay | Admitting: Cardiology

## 2018-10-30 DIAGNOSIS — I493 Ventricular premature depolarization: Secondary | ICD-10-CM

## 2018-10-31 ENCOUNTER — Telehealth: Payer: Self-pay

## 2018-10-31 NOTE — Telephone Encounter (Signed)
Yes, she can take her other meds as she normally does. Just needs to wait to start sotalol until she gets here. Make sure she stopped diltiazem and started amlodipine.

## 2018-10-31 NOTE — Telephone Encounter (Signed)
Pt called and wanted to know if she should take her meds the morning of her appt to come in here and start the sotalol. Please advise.

## 2018-11-04 ENCOUNTER — Ambulatory Visit: Payer: Self-pay

## 2018-11-05 ENCOUNTER — Encounter: Payer: Medicare HMO | Admitting: Psychology

## 2018-11-05 ENCOUNTER — Encounter

## 2018-11-06 ENCOUNTER — Ambulatory Visit: Payer: Self-pay

## 2018-11-11 ENCOUNTER — Ambulatory Visit (INDEPENDENT_AMBULATORY_CARE_PROVIDER_SITE_OTHER): Payer: BLUE CROSS/BLUE SHIELD | Admitting: Cardiology

## 2018-11-11 DIAGNOSIS — I480 Paroxysmal atrial fibrillation: Secondary | ICD-10-CM

## 2018-11-11 NOTE — Progress Notes (Signed)
63 year old African-American female with medically treated coronary artery disease, distal apical 70% stenosis on cath in 04/2018, paroxysmal atrial fibrillation, frequent PVCs on exercise nuclear stress test.  Patient was recommended sotalol as antiarrhythmic therapy to treat her symptomatic paroxysmal atrial fibrillation, which could potentially also help her frequent PVCs.  Patient had multiple questions regarding initiation of antiarrhythmic therapy today.  Answered her questions to best of my ability.  I explained the rationale for sotalol as the choice of antiarrhythmic therapy, as above.  She has normal QT interval and structurally normal heart.  I also discussed alternate options of antiarrhythmic therapy, including Tikosyn which would require hospital admission, as well as dronedarone which typically is more expensive.  I also gave her the option of referral to electrophysiology for consideration for ablation.  Patient is unsure of which option she would like to proceed with.  She requested follow-up visit in 4 weeks.  EKG 11/11/2018: Sinus rhythm 56 bpm.  Left axis -anterior fascicular block.  Normal QTc interval  Physical Exam  Constitutional: She is oriented to person, place, and time. She appears well-developed and well-nourished. No distress.  HENT:  Head: Normocephalic and atraumatic.  Eyes: Pupils are equal, round, and reactive to light. Conjunctivae are normal.  Neck: No JVD present.  Cardiovascular: Normal rate, regular rhythm and intact distal pulses.  Pulmonary/Chest: Effort normal and breath sounds normal. She has no wheezes. She has no rales.  Abdominal: Soft. Bowel sounds are normal. There is no rebound.  Musculoskeletal:        General: No edema.  Lymphadenopathy:    She has no cervical adenopathy.  Neurological: She is alert and oriented to person, place, and time. No cranial nerve deficit.  Skin: Skin is warm and dry.  Psychiatric: She has a normal mood and affect.   Nursing note and vitals reviewed.  Nigel Mormon, MD [facility] Pager: [phone] Office: [phone] If no answer Cell [phone]

## 2018-11-12 ENCOUNTER — Telehealth: Payer: Self-pay

## 2018-11-12 DIAGNOSIS — J45909 Unspecified asthma, uncomplicated: Secondary | ICD-10-CM | POA: Diagnosis not present

## 2018-11-12 DIAGNOSIS — G8929 Other chronic pain: Secondary | ICD-10-CM | POA: Diagnosis not present

## 2018-11-12 DIAGNOSIS — I2510 Atherosclerotic heart disease of native coronary artery without angina pectoris: Secondary | ICD-10-CM | POA: Diagnosis not present

## 2018-11-12 DIAGNOSIS — I252 Old myocardial infarction: Secondary | ICD-10-CM | POA: Diagnosis not present

## 2018-11-12 DIAGNOSIS — E785 Hyperlipidemia, unspecified: Secondary | ICD-10-CM | POA: Diagnosis not present

## 2018-11-12 DIAGNOSIS — D649 Anemia, unspecified: Secondary | ICD-10-CM | POA: Diagnosis not present

## 2018-11-12 DIAGNOSIS — E039 Hypothyroidism, unspecified: Secondary | ICD-10-CM | POA: Diagnosis not present

## 2018-11-12 DIAGNOSIS — I10 Essential (primary) hypertension: Secondary | ICD-10-CM | POA: Diagnosis not present

## 2018-11-12 DIAGNOSIS — I4891 Unspecified atrial fibrillation: Secondary | ICD-10-CM | POA: Diagnosis not present

## 2018-11-12 DIAGNOSIS — E669 Obesity, unspecified: Secondary | ICD-10-CM | POA: Diagnosis not present

## 2018-11-12 DIAGNOSIS — G629 Polyneuropathy, unspecified: Secondary | ICD-10-CM | POA: Diagnosis not present

## 2018-11-12 NOTE — Telephone Encounter (Signed)
Pt called and left voicemail stating she fell yesterday and hit her head, and wanted to know if she needs to go to the ER.  Tried to call Pt back but she did not answer. Left voicemail for her to call back.  Please Advise. Thank you.  [place]

## 2018-11-12 NOTE — Telephone Encounter (Signed)
How did she fall, hope she didn't pass out? As long as she doesnt have any changes symptoms or mental status changes, she is likely okay to not go.

## 2018-11-13 ENCOUNTER — Telehealth: Payer: Self-pay | Admitting: Interventional Cardiology

## 2018-11-13 ENCOUNTER — Other Ambulatory Visit: Payer: Self-pay

## 2018-11-13 ENCOUNTER — Ambulatory Visit (INDEPENDENT_AMBULATORY_CARE_PROVIDER_SITE_OTHER)
Admission: RE | Admit: 2018-11-13 | Discharge: 2018-11-13 | Disposition: A | Discharge status: Home / Self Care | Payer: BLUE CROSS/BLUE SHIELD | Source: Ambulatory Visit | Attending: Interventional Cardiology | Admitting: Interventional Cardiology

## 2018-11-13 ENCOUNTER — Encounter: Payer: Self-pay | Admitting: Interventional Cardiology

## 2018-11-13 ENCOUNTER — Ambulatory Visit (INDEPENDENT_AMBULATORY_CARE_PROVIDER_SITE_OTHER): Payer: BLUE CROSS/BLUE SHIELD | Admitting: Interventional Cardiology

## 2018-11-13 VITALS — BP 122/78 | HR 64 | Ht 65.0 in | Wt 206.6 lb

## 2018-11-13 DIAGNOSIS — I480 Paroxysmal atrial fibrillation: Secondary | ICD-10-CM

## 2018-11-13 DIAGNOSIS — I25118 Atherosclerotic heart disease of native coronary artery with other forms of angina pectoris: Secondary | ICD-10-CM | POA: Diagnosis not present

## 2018-11-13 DIAGNOSIS — W19XXXA Unspecified fall, initial encounter: Secondary | ICD-10-CM | POA: Diagnosis not present

## 2018-11-13 DIAGNOSIS — I493 Ventricular premature depolarization: Secondary | ICD-10-CM | POA: Diagnosis not present

## 2018-11-13 DIAGNOSIS — S0990XA Unspecified injury of head, initial encounter: Secondary | ICD-10-CM | POA: Diagnosis not present

## 2018-11-13 DIAGNOSIS — I10 Essential (primary) hypertension: Secondary | ICD-10-CM

## 2018-11-13 DIAGNOSIS — R51 Headache: Secondary | ICD-10-CM | POA: Diagnosis not present

## 2018-11-13 NOTE — Telephone Encounter (Signed)
Whitney Daniel with [facility] called with results of CT of head written below, will send to Dr. Tamala Julian and his nurse.    IMPRESSION: Focal area of decreased attenuation in the right occipital lobe, concerning for recent infarct in this area. Elsewhere brain parenchyma appears normal. No mass or hemorrhage. No extra-axial fluid collection.  Mucosal thickening noted in several ethmoid air cells.  These results will be called to the ordering clinician or representative by the Radiologist Assistant, and communication documented in the PACS or zVision Dashboard.

## 2018-11-13 NOTE — Progress Notes (Signed)
Cardiology Office Note:    Date:  11/13/2018   ID:  Whitney, Daniel 1955/12/22, MRN [id number]  PCP:  London Pepper, MD  Cardiologist:  No primary care provider on file.   Referring MD: London Pepper, MD   Chief Complaint  Patient presents with  . Atrial Fibrillation    History of Present Illness:    Whitney Daniel is a 63 y.o. female with a hx of paroxysmal atrial for ablation, premature ventricular contractions, hypertension, and chest pain who is being seen in consultation from London Pepper MD for second opinion concerning management of atrial fibrillation and chest pain.  Whitney Daniel has been previously seen by me during a hospital admission for pain.  She was identified to have distal 75% stenosis in the LAD and 40% proximal RCA disease.  On August 03, 2018 the patient suddenly developed racing heart with chest discomfort.  She was found to be in atrial fib with rapid ventricular response.  She was hospitalized that [facility].  She has spontaneous conversion.  She was subsequently referred to for longitudinal cardiology follow-up to [facility] health at her request.  States that she had requested to see me but ended up with Dr. Virgina Jock.  Additional work-up has been done regarding the atrial fibrillation.  She has apparently worn a 14-day patch but I do not have results.  Not certain if other cardiac work-up is been done.  Echocardiogram performed in August 2019 as outlined below.  Based upon Dr. Bonney Roussel recommendations, the patient is on apixaban 5 mg twice daily and he is recommended a rhythm control strategy with sotalol 80 mg twice per day which the patient has not started.  After reading about the medication side effects, she became concerned about prolonged QT and life-threatening arrhythmia.  This drove the second opinion concerning management.  Based upon information within [facility], apparently a stress nuclear study demonstrated frequent PVCs.   Given significant symptoms when in atrial fibrillation, prior documentation of CAD, sotalol was recommended as a means to control rhythm.  Since starting apixaban, the patient has fallen and has significant occipital blow to her head on a concrete floor.  She denies headache.  This occurred within the past 48 hours.  Past Medical History:  Diagnosis Date  . A-fib ([place])   . Anemia   . Cancer ([place])   . Fibromyalgia   . Hypertension   . Hypothyroidism   . Osteoarthritis   . SVD (spontaneous vaginal delivery)    x 5    Past Surgical History:  Procedure Laterality Date  . COLONOSCOPY    . DILATATION & CURRETTAGE/HYSTEROSCOPY WITH RESECTOCOPE N/A 07/15/2014   Procedure: DILATATION & CURETTAGE/HYSTEROSCOPY WITH RESECTOCOPE;  Surgeon: Marvene Staff, MD;  Location: [facility];  Service: Gynecology;  Laterality: N/A;  . DILATION AND CURETTAGE OF UTERUS     polyps  . LEFT HEART CATH AND CORONARY ANGIOGRAPHY N/A 04/16/2018   Procedure: LEFT HEART CATH AND CORONARY ANGIOGRAPHY;  Surgeon: Belva Crome, MD;  Location: [facility];  Service: Cardiovascular;  Laterality: N/A;  . right thigh surgery     cancer  . WISDOM TOOTH EXTRACTION      Current Medications: Current Meds  Medication Sig  . amLODipine (NORVASC) 5 MG tablet Take 5 mg by mouth daily.  Marland Kitchen atorvastatin (LIPITOR) 80 MG tablet Take 1 tablet (80 mg total) by mouth daily at 6 PM.  . budesonide-formoterol (SYMBICORT) 160-4.5 MCG/ACT inhaler Inhale 2 puffs into the  lungs daily as needed (shortness of breath or wheezing).   . Cholecalciferol (VITAMIN D-1000 MAX ST) 25 MCG (1000 UT) tablet Take 1,000 Units by mouth daily.  Marland Kitchen ELIQUIS 5 MG TABS tablet Take 5 mg by mouth 2 (two) times daily.  . hydrOXYzine (ATARAX/VISTARIL) 25 MG tablet Take 25-50 mg by mouth every 8 (eight) hours as needed for itching.  . levothyroxine (SYNTHROID, LEVOTHROID) 75 MCG tablet Take 75 mcg by mouth daily before breakfast.   .  losartan-hydrochlorothiazide (HYZAAR) 50-12.5 MG tablet Take 1 tablet by mouth daily.  . Multiple Vitamin (MULTIVITAMIN WITH MINERALS) TABS Take 1 tablet by mouth daily.  . nitroGLYCERIN (NITROSTAT) 0.4 MG SL tablet Place 1 tablet (0.4 mg total) under the tongue every 5 (five) minutes as needed for chest pain.  . pregabalin (LYRICA) 100 MG capsule Take 1 capsule (100 mg total) by mouth 2 (two) times daily as needed (nerve pain).  . sotalol (BETAPACE) 80 MG tablet TAKE 1 TABLET BY MOUTH TWICE A DAY. **STOP DILTIAZEM**  . traMADol (ULTRAM) 50 MG tablet Take 1 tablet (50 mg total) by mouth 2 (two) times daily as needed for moderate pain.     Allergies:   Codeine and Vicodin [hydrocodone-acetaminophen]   Social History   Socioeconomic History  . Marital status: Married    Spouse name: Not on file  . Number of children: Not on file  . Years of education: Not on file  . Highest education level: Not on file  Occupational History  . Not on file  Social Needs  . Financial resource strain: Not on file  . Food insecurity:    Worry: Not on file    Inability: Not on file  . Transportation needs:    Medical: Not on file    Non-medical: Not on file  Tobacco Use  . Smoking status: Never Smoker  . Smokeless tobacco: Never Used  Substance and Sexual Activity  . Alcohol use: Yes    Alcohol/week: 0.0 standard drinks    Comment: social - wine  . Drug use: No  . Sexual activity: Yes    Birth control/protection: Post-menopausal  Lifestyle  . Physical activity:    Days per week: Not on file    Minutes per session: Not on file  . Stress: Not on file  Relationships  . Social connections:    Talks on phone: Not on file    Gets together: Not on file    Attends religious service: Not on file    Active member of club or organization: Not on file    Attends meetings of clubs or organizations: Not on file    Relationship status: Not on file  Other Topics Concern  . Not on file  Social History  Narrative   Lives alone in a one story home.  Has 5 children.  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      Family History: The patient's family history includes CVA in her mother; Cancer in her father; Diabetes in her father and sister; Heart attack in her sister; Hypertension in her mother.  ROS:   Please see the history of present illness.    Recent head trauma, back pain, muscle pain, rash, dizziness, easy bruising, difficulty with balance since hitting her head, joint swelling, anxiety concerning her heart health.  Occasional abdominal discomfort.  All other systems reviewed and are negative.  EKGs/Labs/Other Studies Reviewed:  The following studies were reviewed today:  2D Doppler echocardiogram 04/16/2018: Study Conclusions  - Left ventricle: The cavity size was normal. Systolic function was   normal. The estimated ejection fraction was in the range of 55%   to 60%. Wall motion was normal; there were no regional wall   motion abnormalities. Left ventricular diastolic function   parameters were normal. - Tricuspid valve: There was trivial regurgitation. - Pulmonic valve: There was trivial regurgitation. - Pulmonary arteries: Systolic pressure could not be accurately   estimated.  2D Doppler echocardiogram October 29, 2018: Result status: Final result  Echocardiogram 10/29/2018: Left ventricle cavity is normal in size. Normal global wall motion. Normal diastolic filling pattern. Calculated EF 55%. Left atrial cavity is mildly dilated. Aneurysmal interatrial septum. PFO cannot be excluded.  Right atrial cavity is mildly dilated. Mild tricuspid regurgitation.  No evidence of pulmonary hypertension.      EKG:  EKG the most recent EKG was performed on 11/11/2018 by Dr. Virgina Jock and demonstrated sinus bradycardia with left anterior hemiblock.  This appearance is unchanged from  EKGs in August 2019 when she underwent coronary angiography.  Recent Labs: 04/16/2018: ALT 15; Hemoglobin 11.9; Platelets 252 04/17/2018: BUN 10; Creatinine, Ser 0.94; Magnesium 2.0; Potassium 3.9; Sodium 135  Recent Lipid Panel    Component Value Date/Time   CHOL 206 (H) 04/16/2018 0605   TRIG 81 04/16/2018 0605   HDL 46 04/16/2018 0605   CHOLHDL 4.5 04/16/2018 0605   VLDL 16 04/16/2018 0605   LDLCALC 144 (H) 04/16/2018 0605    Physical Exam:    VS:  BP 122/78   Pulse 64   Ht 5\' 5"  (1.651 m)   Wt 206 lb 9.6 oz (93.7 kg)   SpO2 96%   BMI 34.38 kg/m     Wt Readings from Last 3 Encounters:  11/13/18 206 lb 9.6 oz (93.7 kg)  05/17/18 210 lb 6.4 oz (95.4 kg)  04/26/18 210 lb (95.3 kg)     GEN: Mildly obese but overall well appearing.. No acute distress HEENT: Normal NECK: No JVD. LYMPHATICS: No lymphadenopathy CARDIAC: RRR.  No murmur, no gallop, no edema VASCULAR: 2+ bilateral radial and carotid pulses, no bruits RESPIRATORY:  Clear to auscultation without rales, wheezing or rhonchi  ABDOMEN: Soft, non-tender, non-distended, No pulsatile mass, MUSCULOSKELETAL: No deformity  SKIN: Warm and dry NEUROLOGIC:  Alert and oriented x 3 PSYCHIATRIC:  Normal affect   ASSESSMENT:    1. PAF (paroxysmal atrial fibrillation) ([place])   2. Frequent PVCs   3. Essential hypertension   4. Chest pain, unspecified type   5. Fall, initial encounter    PLAN:    In order of problems listed above:  1. 1 extended episode of atrial fibrillation with symptoms and included dyspnea, chest pressure, and extreme fatigue leading to admission to the hospital where she ultimately had spontaneous conversion. 2. As mentioned above, PVCs were noted on a stress nuclear study performed recently. 3. History of elevated blood pressure now well controlled on current medical regimen. 4. Chest pain in the setting of atrial fib with rapid ventricular response is likely related to ischemia in the distal LAD  territory due to supply demand mismatch. 5. Anticoagulated patient with recent fall and significant blow to her head.  Noncontrasted CT will be obtained today to rule out intracranial bleeding related to the fall.  The patient has not yet started sotalol.  Sotalol therapy is reasonable given her clinical situation.  I did not pressure her to  resume therapy.  We will get information concerning ambulatory monitor.  We discussed the rationale of rhythm versus rate control.  Rhythm control seems reasonable given her severe symptoms during the episode of atrial fibrillation.  She would like to continue to follow-up with me.  I will plan to see her back in 1 month after have had time to review all of her data.  In the meantime she will contact us if any significant tacky arrhythmias or other clinical problem.  Addendum:  CT scan performed 11/13/2018: IMPRESSION: Focal area of decreased attenuation in the right occipital lobe, concerning for recent infarct in this area. Elsewhere brain parenchyma appears normal. No mass or hemorrhage. No extra-axial fluid collection.  Mucosal thickening noted in several ethmoid air cells.  These results will be called to the ordering clinician or representative by the Radiologist Assistant, and communication documented in the PACS or zVision Dashboard.  Medication Adjustments/Labs and Tests Ordered: Current medicines are reviewed at length with the patient today.  Concerns regarding medicines are outlined above.  Orders Placed This Encounter  Procedures  . CT Head Wo Contrast   No orders of the defined types were placed in this encounter.   Patient Instructions  Medication Instructions:  Your physician recommends that you continue on your current medications as directed. Please refer to the Current Medication list given to you today.  If you need a refill on your cardiac medications before your next appointment, please call your pharmacy.   Lab work:  None If you have labs (blood work) drawn today and your tests are completely normal, you will receive your results only by: Marland Kitchen MyChart Message (if you have MyChart) OR . A paper copy in the mail If you have any lab test that is abnormal or we need to change your treatment, we will call you to review the results.  Testing/Procedures: Your physician recommends that you have a non-contrast CT of the head performed.  Follow-Up: At [facility], you and your health needs are our priority.  As part of our continuing mission to provide you with exceptional heart care, we have created designated Provider Care Teams.  These Care Teams include your primary Cardiologist (physician) and Advanced Practice Providers (APPs -  Physician Assistants and Nurse Practitioners) who all work together to provide you with the care you need, when you need it. You will need a follow up appointment in 1 months (can have 4/16 at 10:20A) . You may see Dr. Tamala Julian or one of the following Advanced Practice Providers on your designated Care Team:   Truitt Merle, NP Cecilie Kicks, NP . Kathyrn Drown, NP  Any Other Special Instructions Will Be Listed Below (If Applicable).       Signed, Sinclair Grooms, MD  11/13/2018 12:42 PM    [place]

## 2018-11-13 NOTE — Telephone Encounter (Signed)
See CT result

## 2018-11-13 NOTE — Telephone Encounter (Signed)
New Message   Erline Levine from [facility] has Stat call report for patient of Dr. Thompson Caul.

## 2018-11-13 NOTE — Patient Instructions (Addendum)
Medication Instructions:  Your physician recommends that you continue on your current medications as directed. Please refer to the Current Medication list given to you today.  If you need a refill on your cardiac medications before your next appointment, please call your pharmacy.   Lab work: None If you have labs (blood work) drawn today and your tests are completely normal, you will receive your results only by: Marland Kitchen MyChart Message (if you have MyChart) OR . A paper copy in the mail If you have any lab test that is abnormal or we need to change your treatment, we will call you to review the results.  Testing/Procedures: Your physician recommends that you have a non-contrast CT of the head performed.  Follow-Up: At [facility], you and your health needs are our priority.  As part of our continuing mission to provide you with exceptional heart care, we have created designated Provider Care Teams.  These Care Teams include your primary Cardiologist (physician) and Advanced Practice Providers (APPs -  Physician Assistants and Nurse Practitioners) who all work together to provide you with the care you need, when you need it. You will need a follow up appointment in 1 months (can have 4/16 at 10:20A) . You may see Dr. Tamala Julian or one of the following Advanced Practice Providers on your designated Care Team:   Truitt Merle, NP Cecilie Kicks, NP . Kathyrn Drown, NP  Any Other Special Instructions Will Be Listed Below (If Applicable).

## 2018-11-14 ENCOUNTER — Other Ambulatory Visit: Payer: Self-pay | Admitting: Nurse Practitioner

## 2018-11-14 DIAGNOSIS — K7400 Hepatic fibrosis, unspecified: Secondary | ICD-10-CM

## 2018-11-18 ENCOUNTER — Other Ambulatory Visit: Payer: Medicare HMO

## 2018-11-19 NOTE — Telephone Encounter (Signed)
Left another voicemail for Pt to call back.

## 2018-11-19 NOTE — Telephone Encounter (Signed)
I have reviewed her chart, it appears that she has gotten second opinion from Dr. Daneen Schick and prefers to follow back up with him. He has ordered CT of the head that he will follow up on. I am fine with this, just need to confirm with the patient on her wishes and will be happy to send any test results to Dr. Tamala Julian that he will need.

## 2018-11-22 ENCOUNTER — Telehealth: Payer: Self-pay | Admitting: Interventional Cardiology

## 2018-11-22 NOTE — Telephone Encounter (Signed)
Medical records requested from [facility]. 11/22/18 vlm

## 2018-11-30 ENCOUNTER — Other Ambulatory Visit: Payer: Self-pay | Admitting: Cardiology

## 2018-11-30 DIAGNOSIS — I493 Ventricular premature depolarization: Secondary | ICD-10-CM

## 2018-12-02 NOTE — Telephone Encounter (Signed)
Can I fill this for pt

## 2018-12-03 ENCOUNTER — Other Ambulatory Visit: Payer: Self-pay | Admitting: Cardiology

## 2018-12-03 DIAGNOSIS — I25118 Atherosclerotic heart disease of native coronary artery with other forms of angina pectoris: Secondary | ICD-10-CM

## 2018-12-04 ENCOUNTER — Telehealth: Payer: Self-pay | Admitting: Interventional Cardiology

## 2018-12-04 NOTE — Telephone Encounter (Signed)
While reviewing medications with pt, she was getting a call from Dr Irven Shelling office, pt and I Benson [place] up with understanding she will call back if further clarification is needed post call.

## 2018-12-04 NOTE — Telephone Encounter (Signed)
Pt has medicine issues and needs clarification about which medications she should be taking

## 2018-12-08 ENCOUNTER — Telehealth: Payer: Self-pay | Admitting: Interventional Cardiology

## 2018-12-08 NOTE — Telephone Encounter (Signed)
Video virtual visit.

## 2018-12-11 DIAGNOSIS — Z79899 Other long term (current) drug therapy: Secondary | ICD-10-CM | POA: Diagnosis not present

## 2018-12-11 DIAGNOSIS — M47817 Spondylosis without myelopathy or radiculopathy, lumbosacral region: Secondary | ICD-10-CM | POA: Diagnosis not present

## 2018-12-11 DIAGNOSIS — M2550 Pain in unspecified joint: Secondary | ICD-10-CM | POA: Diagnosis not present

## 2018-12-11 DIAGNOSIS — Z8739 Personal history of other diseases of the musculoskeletal system and connective tissue: Secondary | ICD-10-CM | POA: Diagnosis not present

## 2018-12-11 DIAGNOSIS — Z79891 Long term (current) use of opiate analgesic: Secondary | ICD-10-CM | POA: Diagnosis not present

## 2018-12-11 DIAGNOSIS — G894 Chronic pain syndrome: Secondary | ICD-10-CM | POA: Diagnosis not present

## 2018-12-12 NOTE — Telephone Encounter (Signed)
Left message to call back  

## 2018-12-13 ENCOUNTER — Ambulatory Visit
Admission: RE | Admit: 2018-12-13 | Discharge: 2018-12-13 | Disposition: A | Discharge status: Home / Self Care | Payer: BLUE CROSS/BLUE SHIELD | Source: Ambulatory Visit | Attending: Pain Medicine | Admitting: Pain Medicine

## 2018-12-13 ENCOUNTER — Other Ambulatory Visit: Payer: Self-pay

## 2018-12-13 ENCOUNTER — Other Ambulatory Visit: Payer: Self-pay | Admitting: Pain Medicine

## 2018-12-13 DIAGNOSIS — M25552 Pain in left hip: Secondary | ICD-10-CM | POA: Diagnosis not present

## 2018-12-13 DIAGNOSIS — M25551 Pain in right hip: Secondary | ICD-10-CM

## 2018-12-13 NOTE — Telephone Encounter (Signed)
Left message for pt to call back.  Needs to be changed to a virtual visit with Dr. Tamala Julian on 4/16 at 9:30A.

## 2018-12-16 ENCOUNTER — Ambulatory Visit: Payer: Medicare HMO | Admitting: Cardiology

## 2018-12-17 ENCOUNTER — Other Ambulatory Visit: Payer: Self-pay | Admitting: Pain Medicine

## 2018-12-17 DIAGNOSIS — M545 Low back pain, unspecified: Secondary | ICD-10-CM

## 2018-12-17 NOTE — Telephone Encounter (Signed)
Spoke with pt and she is agreeable to a virtual visit at 930 on the 16th.  Advised I would have someone reach out to her to get her set up.

## 2018-12-18 ENCOUNTER — Other Ambulatory Visit: Payer: Self-pay | Admitting: Pain Medicine

## 2018-12-18 ENCOUNTER — Ambulatory Visit
Admission: RE | Admit: 2018-12-18 | Discharge: 2018-12-18 | Disposition: A | Discharge status: Home / Self Care | Payer: BLUE CROSS/BLUE SHIELD | Source: Ambulatory Visit | Attending: Pain Medicine | Admitting: Pain Medicine

## 2018-12-18 ENCOUNTER — Other Ambulatory Visit: Payer: Self-pay

## 2018-12-18 DIAGNOSIS — M5440 Lumbago with sciatica, unspecified side: Secondary | ICD-10-CM

## 2018-12-18 DIAGNOSIS — M545 Low back pain: Secondary | ICD-10-CM | POA: Diagnosis not present

## 2018-12-18 NOTE — Progress Notes (Signed)
Virtual Visit via Video Note   This visit type was conducted due to national recommendations for restrictions regarding the COVID-19 Pandemic (e.g. social distancing) in an effort to limit this patient's exposure and mitigate transmission in our community.  Due to her co-morbid illnesses, this patient is at least at moderate risk for complications without adequate follow up.  This format is felt to be most appropriate for this patient at this time.  All issues noted in this document were discussed and addressed.  A limited physical exam was performed with this format.  Please refer to the patient's chart for her consent to telehealth for [facility].   Evaluation Performed:  Follow-up visit  Date:  12/19/2018   ID:  Whitney Daniel, Whitney Daniel 04/09/1956, MRN [id number]  Patient Location: Home Provider Location: Office  PCP:  London Pepper, MD  Cardiologist:  No primary care provider on file.  Electrophysiologist:  None   Chief Complaint:  PAF  History of Present Illness:    Whitney Daniel is a 63 y.o. female with  PAF, hypertension, chronic anticoagulation, and fibromyxoid fibroma.  She has been having chest discomfort.  She has been having chest discomfort, continuous.  It is made somewhat worse by physical activity.  She is also been dizzy and had an episode where she hit her head on the bed railing.  We discussed the chest discomfort and he gets worse every time she walks and her left foot hits the ground.  Bending and twisting also influences the discomfort.  She is been having a headache on the top of her head 4 days.  This preexisted the recent bump that she received on the end of her bed.  She denies shortness of breath, neurological symptoms, and lower extremity swelling.  The patient does not have symptoms concerning for COVID-19 infection (fever, chills, cough, or new shortness of breath).    Past Medical History:  Diagnosis Date  . A-fib ([place])   . Anemia   . Cancer ([place])    . Fibromyalgia   . Hypertension   . Hypothyroidism   . Osteoarthritis   . SVD (spontaneous vaginal delivery)    x 5   Past Surgical History:  Procedure Laterality Date  . COLONOSCOPY    . DILATATION & CURRETTAGE/HYSTEROSCOPY WITH RESECTOCOPE N/A 07/15/2014   Procedure: DILATATION & CURETTAGE/HYSTEROSCOPY WITH RESECTOCOPE;  Surgeon: Marvene Staff, MD;  Location: [facility];  Service: Gynecology;  Laterality: N/A;  . DILATION AND CURETTAGE OF UTERUS     polyps  . LEFT HEART CATH AND CORONARY ANGIOGRAPHY N/A 04/16/2018   Procedure: LEFT HEART CATH AND CORONARY ANGIOGRAPHY;  Surgeon: Belva Crome, MD;  Location: [facility];  Service: Cardiovascular;  Laterality: N/A;  . right thigh surgery     cancer  . WISDOM TOOTH EXTRACTION       Current Meds  Medication Sig  . amLODipine (NORVASC) 5 MG tablet TAKE 1 TABLET BY MOUTH EVERY DAY  . Ascorbic Acid (VITAMIN C) 500 MG CHEW Chew 500 mg by mouth daily.  Marland Kitchen atorvastatin (LIPITOR) 80 MG tablet TAKE 1 TABLET BY MOUTH EVERY DAY  . Biotin 10000 MCG TABS Take 10,000 mcg by mouth daily.  . budesonide-formoterol (SYMBICORT) 160-4.5 MCG/ACT inhaler Inhale 2 puffs into the lungs daily as needed (shortness of breath or wheezing).   . Chromium Picolinate 1000 MCG TABS Take 1,000 mcg by mouth daily.  Marland Kitchen ELIQUIS 5 MG TABS tablet Take 5 mg by mouth 2 (two)  times daily.  Marland Kitchen levothyroxine (SYNTHROID, LEVOTHROID) 75 MCG tablet Take 75 mcg by mouth daily before breakfast.   . losartan-hydrochlorothiazide (HYZAAR) 50-12.5 MG tablet Take 1 tablet by mouth daily.  . Multiple Vitamin (MULTIVITAMIN WITH MINERALS) TABS Take 1 tablet by mouth daily.  . nitroGLYCERIN (NITROSTAT) 0.4 MG SL tablet Place 1 tablet (0.4 mg total) under the tongue every 5 (five) minutes as needed for chest pain.  . pregabalin (LYRICA) 75 MG capsule Take 75 mg by mouth 3 (three) times daily.  . traMADol (ULTRAM) 50 MG tablet Take 1 tablet (50 mg total) by mouth 2 (two) times daily  as needed for moderate pain.  . vitamin E 400 UNIT capsule Take 400 Units by mouth daily.     Allergies:   Codeine and Vicodin [hydrocodone-acetaminophen]   Social History   Tobacco Use  . Smoking status: Never Smoker  . Smokeless tobacco: Never Used  Substance Use Topics  . Alcohol use: Yes    Alcohol/week: 0.0 standard drinks    Comment: social - wine  . Drug use: No     Family Hx: The patient's family history includes CVA in her mother; Cancer in her father; Diabetes in her father and sister; Heart attack in her sister; Hypertension in her mother.  ROS:   Please see the history of present illness.    She is not sleeping more than 2 to 3 hours per night.  She sets her alarm clock for 3 AM to listen to the radio.  She is anxious and nervous. All other systems reviewed and are negative.   Prior CV studies:   The following studies were reviewed today:  No new data  Labs/Other Tests and Data Reviewed:    EKG:  No ECG reviewed.  Recent Labs: 04/16/2018: ALT 15; Hemoglobin 11.9; Platelets 252 04/17/2018: BUN 10; Creatinine, Ser 0.94; Magnesium 2.0; Potassium 3.9; Sodium 135   Recent Lipid Panel Lab Results  Component Value Date/Time   CHOL 206 (H) 04/16/2018 06:05 AM   TRIG 81 04/16/2018 06:05 AM   HDL 46 04/16/2018 06:05 AM   CHOLHDL 4.5 04/16/2018 06:05 AM   LDLCALC 144 (H) 04/16/2018 06:05 AM    Wt Readings from Last 3 Encounters:  12/19/18 202 lb 8 oz (91.9 kg)  11/13/18 206 lb 9.6 oz (93.7 kg)  05/17/18 210 lb 6.4 oz (95.4 kg)     Objective:    Vital Signs:  BP 128/85   Pulse 76   Ht 5\' 5"  (1.651 m)   Wt 202 lb 8 oz (91.9 kg)   BMI 33.70 kg/m    Well nourished, well developed female in no acute distress. She appears anxious/apprehensive.  She is not having difficulty breathing.  She ambulates without difficulty.  There is no peripheral edema.  ASSESSMENT & PLAN:    1. PAF (paroxysmal atrial fibrillation) ([place])   2. Coronary artery disease of  native artery of native heart with stable angina pectoris ([place])   3. Essential hypertension   4. Frequent PVCs   5. 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 Educated About Covid-19 Virus Infection    PLAN in order of problem:  1. Continue apixaban.  She is not currently taking either sotalol or diltiazem.  Current vital signs suggest that heart rate is well controlled. 2. Current chest discomfort is not felt to represent myocardial ischemia. 3. Excellent blood pressure control.  Reassurance was given over the phone.  I believe she is doing well from a cardiac standpoint.  She seems to be well controlled from the standpoint of blood pressure and heart rate.  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  This was discussed in detail with the patient.  COVID-19 Education: The signs and symptoms of COVID-19 were discussed with the patient and how to seek care for testing (follow up with PCP or arrange E-visit).  The importance of social distancing was discussed today.  Time:   Today, I have spent 20 minutes with the patient with telehealth technology discussing the above problems.     Medication Adjustments/Labs and Tests Ordered: Current medicines are reviewed at length with the patient today.  Concerns regarding medicines are outlined above.   Tests Ordered: No orders of the defined types were placed in this encounter.   Medication Changes: No orders of the defined types were placed in this encounter.   Disposition:  Follow up in 4 month(s)  Signed, Sinclair Grooms, MD  12/19/2018 10:19 AM    [place]

## 2018-12-19 ENCOUNTER — Telehealth (INDEPENDENT_AMBULATORY_CARE_PROVIDER_SITE_OTHER): Payer: BLUE CROSS/BLUE SHIELD | Admitting: Interventional Cardiology

## 2018-12-19 ENCOUNTER — Encounter: Payer: Self-pay | Admitting: Interventional Cardiology

## 2018-12-19 VITALS — BP 128/85 | HR 76 | Ht 65.0 in | Wt 202.5 lb

## 2018-12-19 DIAGNOSIS — C499 Malignant neoplasm of connective and soft tissue, unspecified: Secondary | ICD-10-CM

## 2018-12-19 DIAGNOSIS — Z7189 Other specified counseling: Secondary | ICD-10-CM

## 2018-12-19 DIAGNOSIS — I493 Ventricular premature depolarization: Secondary | ICD-10-CM

## 2018-12-19 DIAGNOSIS — I10 Essential (primary) hypertension: Secondary | ICD-10-CM

## 2018-12-19 DIAGNOSIS — I480 Paroxysmal atrial fibrillation: Secondary | ICD-10-CM

## 2018-12-19 DIAGNOSIS — I25118 Atherosclerotic heart disease of native coronary artery with other forms of angina pectoris: Secondary | ICD-10-CM

## 2018-12-19 NOTE — Patient Instructions (Signed)
Medication Instructions:  Your physician recommends that you continue on your current medications as directed. Please refer to the Current Medication list given to you today.  If you need a refill on your cardiac medications before your next appointment, please call your pharmacy.   Lab work: None If you have labs (blood work) drawn today and your tests are completely normal, you will receive your results only by: Marland Kitchen MyChart Message (if you have MyChart) OR . A paper copy in the mail If you have any lab test that is abnormal or we need to change your treatment, we will call you to review the results.  Testing/Procedures: None  Follow-Up: At [facility], you and your health needs are our priority.  As part of our continuing mission to provide you with exceptional heart care, we have created designated Provider Care Teams.  These Care Teams include your primary Cardiologist (physician) and Advanced Practice Providers (APPs -  Physician Assistants and Nurse Practitioners) who all work together to provide you with the care you need, when you need it. You will need a follow up appointment in 3-4 months.  Please call our office 2 months in advance to schedule this appointment.  You may see Dr. Tamala Julian or one of the following Advanced Practice Providers on your designated Care Team:   Truitt Merle,  Cecilie Kicks, NP . Kathyrn Drown, NP  Any Other Special Instructions Will Be Listed Below (If Applicable).  Dr. Tamala Julian recommends that you take Tylenol for your chest pain and headache.  Call your Primary Care Physician about getting something for your sleeping issues.

## 2018-12-23 DIAGNOSIS — D649 Anemia, unspecified: Secondary | ICD-10-CM | POA: Diagnosis not present

## 2018-12-25 DIAGNOSIS — M47817 Spondylosis without myelopathy or radiculopathy, lumbosacral region: Secondary | ICD-10-CM | POA: Diagnosis not present

## 2018-12-26 ENCOUNTER — Other Ambulatory Visit: Payer: Self-pay | Admitting: Cardiology

## 2018-12-26 DIAGNOSIS — I25118 Atherosclerotic heart disease of native coronary artery with other forms of angina pectoris: Secondary | ICD-10-CM

## 2018-12-27 ENCOUNTER — Other Ambulatory Visit: Payer: Self-pay | Admitting: Cardiology

## 2018-12-27 DIAGNOSIS — I480 Paroxysmal atrial fibrillation: Secondary | ICD-10-CM

## 2018-12-27 DIAGNOSIS — N200 Calculus of kidney: Secondary | ICD-10-CM | POA: Diagnosis not present

## 2018-12-27 DIAGNOSIS — M545 Low back pain: Secondary | ICD-10-CM | POA: Diagnosis not present

## 2018-12-27 NOTE — Telephone Encounter (Signed)
Pt also sees Dr. Tamala Julian @ [facility] Who fills her Eliquis?

## 2019-01-02 ENCOUNTER — Ambulatory Visit: Payer: Medicare HMO | Admitting: Interventional Cardiology

## 2019-01-03 ENCOUNTER — Other Ambulatory Visit: Payer: Self-pay | Admitting: Interventional Cardiology

## 2019-01-03 ENCOUNTER — Ambulatory Visit: Payer: Medicare HMO | Admitting: Cardiology

## 2019-01-03 ENCOUNTER — Other Ambulatory Visit: Payer: Self-pay

## 2019-01-03 DIAGNOSIS — I480 Paroxysmal atrial fibrillation: Secondary | ICD-10-CM

## 2019-01-03 DIAGNOSIS — M545 Low back pain: Secondary | ICD-10-CM | POA: Diagnosis not present

## 2019-01-03 DIAGNOSIS — N200 Calculus of kidney: Secondary | ICD-10-CM | POA: Diagnosis not present

## 2019-01-03 DIAGNOSIS — N130 Hydronephrosis with ureteropelvic junction obstruction: Secondary | ICD-10-CM | POA: Diagnosis not present

## 2019-01-03 DIAGNOSIS — I25118 Atherosclerotic heart disease of native coronary artery with other forms of angina pectoris: Secondary | ICD-10-CM

## 2019-01-08 DIAGNOSIS — G894 Chronic pain syndrome: Secondary | ICD-10-CM | POA: Diagnosis not present

## 2019-01-08 DIAGNOSIS — M25551 Pain in right hip: Secondary | ICD-10-CM | POA: Diagnosis not present

## 2019-01-08 DIAGNOSIS — M47817 Spondylosis without myelopathy or radiculopathy, lumbosacral region: Secondary | ICD-10-CM | POA: Diagnosis not present

## 2019-01-08 DIAGNOSIS — M2550 Pain in unspecified joint: Secondary | ICD-10-CM | POA: Diagnosis not present

## 2019-01-10 ENCOUNTER — Other Ambulatory Visit: Payer: Self-pay | Admitting: Cardiology

## 2019-01-10 NOTE — Telephone Encounter (Signed)
Dr. Smith patient.  

## 2019-01-10 NOTE — Telephone Encounter (Signed)
Please fill

## 2019-01-10 NOTE — Telephone Encounter (Signed)
Spoke to pharacy advised them weare not seeing her anymore and to send to Dr. Daneen Schick @ [facility]. They said we may receive more requests if she is signed up for automatic refills, it will send to last prescriber.

## 2019-01-10 NOTE — Telephone Encounter (Signed)
Please let the pharmacy know that the patient is not seeing me anymore. She sees Dr. Tamala Julian. Please send the refill request to them.   Thanks MJP

## 2019-01-13 ENCOUNTER — Other Ambulatory Visit: Payer: Self-pay | Admitting: Pharmacist

## 2019-01-13 DIAGNOSIS — Z113 Encounter for screening for infections with a predominantly sexual mode of transmission: Secondary | ICD-10-CM | POA: Diagnosis not present

## 2019-01-13 DIAGNOSIS — Z01419 Encounter for gynecological examination (general) (routine) without abnormal findings: Secondary | ICD-10-CM | POA: Diagnosis not present

## 2019-01-13 DIAGNOSIS — Z124 Encounter for screening for malignant neoplasm of cervix: Secondary | ICD-10-CM | POA: Diagnosis not present

## 2019-01-13 MED ORDER — ELIQUIS 5 MG PO TABS: 5.0000 mg | ORAL_TABLET | Freq: Two times a day (BID) | ORAL | 2 refills | Status: DC

## 2019-01-14 ENCOUNTER — Telehealth: Payer: Self-pay

## 2019-01-14 ENCOUNTER — Other Ambulatory Visit: Payer: Self-pay

## 2019-01-14 NOTE — Telephone Encounter (Signed)
rx refilled on 01/13/19.

## 2019-01-15 ENCOUNTER — Telehealth: Payer: Self-pay

## 2019-01-15 NOTE — Telephone Encounter (Signed)
Refill sent on 01/13/19

## 2019-01-16 DIAGNOSIS — M5430 Sciatica, unspecified side: Secondary | ICD-10-CM | POA: Diagnosis not present

## 2019-01-16 DIAGNOSIS — Q762 Congenital spondylolisthesis: Secondary | ICD-10-CM | POA: Diagnosis not present

## 2019-01-29 DIAGNOSIS — M47817 Spondylosis without myelopathy or radiculopathy, lumbosacral region: Secondary | ICD-10-CM | POA: Diagnosis not present

## 2019-02-03 ENCOUNTER — Other Ambulatory Visit: Payer: Self-pay

## 2019-02-03 ENCOUNTER — Ambulatory Visit
Admission: RE | Admit: 2019-02-03 | Discharge: 2019-02-03 | Disposition: A | Discharge status: Home / Self Care | Payer: BLUE CROSS/BLUE SHIELD | Source: Ambulatory Visit | Attending: Pain Medicine | Admitting: Pain Medicine

## 2019-02-03 DIAGNOSIS — M545 Low back pain, unspecified: Secondary | ICD-10-CM

## 2019-02-10 ENCOUNTER — Ambulatory Visit
Admission: RE | Admit: 2019-02-10 | Discharge: 2019-02-10 | Disposition: A | Discharge status: Home / Self Care | Payer: BLUE CROSS/BLUE SHIELD | Source: Ambulatory Visit | Attending: Nurse Practitioner | Admitting: Nurse Practitioner

## 2019-02-10 ENCOUNTER — Other Ambulatory Visit: Payer: Self-pay

## 2019-02-10 DIAGNOSIS — K7400 Hepatic fibrosis, unspecified: Secondary | ICD-10-CM

## 2019-02-12 DIAGNOSIS — M47816 Spondylosis without myelopathy or radiculopathy, lumbar region: Secondary | ICD-10-CM | POA: Diagnosis not present

## 2019-02-12 DIAGNOSIS — I10 Essential (primary) hypertension: Secondary | ICD-10-CM | POA: Diagnosis not present

## 2019-02-12 DIAGNOSIS — M4317 Spondylolisthesis, lumbosacral region: Secondary | ICD-10-CM | POA: Diagnosis not present

## 2019-02-12 DIAGNOSIS — Z6832 Body mass index (BMI) 32.0-32.9, adult: Secondary | ICD-10-CM | POA: Diagnosis not present

## 2019-02-14 ENCOUNTER — Telehealth: Payer: Self-pay | Admitting: *Deleted

## 2019-02-14 NOTE — Telephone Encounter (Signed)
Pt takes Eliquis for afib with CHADS2VASc score of 2 (sex, HTN). Renal function is normal. Recommend holding Eliquis for 3 days prior to spinal procedure.

## 2019-02-14 NOTE — Telephone Encounter (Signed)
   [facility] Pre-operative Risk Assessment    Request for surgical clearance:  1. What type of surgery is being performed? L5-S1 LUMBAR EPIDURAL STEROID INJECTION  2. When is this surgery scheduled? TBD  3. What type of clearance is required (medical clearance vs. Pharmacy clearance to hold med vs. Both)? BOTH  4. Are there any medications that need to be held prior to surgery and how long? ELIQUIS  5. Practice name and name of physician performing surgery? [facility]; DR. Ellene Route  6. What is your office phone number [phone]   7.   What is your office fax number [phone]  8.   Anesthesia type (None, local, MAC, general) ? LOCAL  Whitney Daniel 02/14/2019, 4:34 PM  _________________________________________________________________   (provider comments below)

## 2019-02-17 NOTE — Telephone Encounter (Signed)
   Primary Cardiologist: Sinclair Grooms, MD  Chart reviewed as part of pre-operative protocol coverage. Given past medical history and time since last visit, based on ACC/AHA guidelines, Whitney Daniel would be at acceptable risk for the planned procedure without further cardiovascular testing.   I will route this recommendation to the requesting party via Epic fax function and remove from pre-op pool.  Please call with questions.  [place], [place] 02/17/2019, 12:52 PM

## 2019-02-28 ENCOUNTER — Other Ambulatory Visit: Payer: Self-pay

## 2019-02-28 DIAGNOSIS — I25118 Atherosclerotic heart disease of native coronary artery with other forms of angina pectoris: Secondary | ICD-10-CM

## 2019-02-28 MED ORDER — AMLODIPINE BESYLATE 5 MG PO TABS: 5.0000 mg | ORAL_TABLET | Freq: Every day | ORAL | 1 refills | Status: DC

## 2019-03-03 DIAGNOSIS — M47817 Spondylosis without myelopathy or radiculopathy, lumbosacral region: Secondary | ICD-10-CM | POA: Diagnosis not present

## 2019-04-02 ENCOUNTER — Other Ambulatory Visit: Payer: Self-pay | Admitting: Pain Medicine

## 2019-04-02 ENCOUNTER — Ambulatory Visit
Admission: RE | Admit: 2019-04-02 | Discharge: 2019-04-02 | Disposition: A | Discharge status: Home / Self Care | Payer: BC Managed Care – PPO | Source: Ambulatory Visit | Attending: Pain Medicine | Admitting: Pain Medicine

## 2019-04-02 DIAGNOSIS — M25561 Pain in right knee: Secondary | ICD-10-CM

## 2019-04-02 DIAGNOSIS — Z79899 Other long term (current) drug therapy: Secondary | ICD-10-CM | POA: Diagnosis not present

## 2019-04-02 DIAGNOSIS — Z8739 Personal history of other diseases of the musculoskeletal system and connective tissue: Secondary | ICD-10-CM | POA: Diagnosis not present

## 2019-04-02 DIAGNOSIS — M25562 Pain in left knee: Secondary | ICD-10-CM

## 2019-04-02 DIAGNOSIS — Z79891 Long term (current) use of opiate analgesic: Secondary | ICD-10-CM | POA: Diagnosis not present

## 2019-04-02 DIAGNOSIS — M2550 Pain in unspecified joint: Secondary | ICD-10-CM | POA: Diagnosis not present

## 2019-04-02 DIAGNOSIS — M47817 Spondylosis without myelopathy or radiculopathy, lumbosacral region: Secondary | ICD-10-CM | POA: Diagnosis not present

## 2019-04-02 DIAGNOSIS — M170 Bilateral primary osteoarthritis of knee: Secondary | ICD-10-CM | POA: Diagnosis not present

## 2019-04-02 DIAGNOSIS — G894 Chronic pain syndrome: Secondary | ICD-10-CM | POA: Diagnosis not present

## 2019-04-16 ENCOUNTER — Other Ambulatory Visit: Payer: Self-pay | Admitting: Interventional Cardiology

## 2019-04-16 NOTE — Telephone Encounter (Signed)
Pt last saw Dr Tamala Julian 12/19/18 telemedicine visit Covid-19, last labs 08/04/18 Creat 0.95 at [facility], age 63, weight 91.9kg, based on specified criteria pt is on appropriate dosage of Eliquis 5mg  BID.  Will refill rx.

## 2019-04-20 NOTE — Progress Notes (Deleted)
Cardiology Office Note:    Date:  04/20/2019   ID:  Whitney Daniel, Whitney Daniel 1955-10-12, MRN [id number]  PCP:  London Pepper, MD  Cardiologist:  Sinclair Grooms, MD   Referring MD: London Pepper, MD   No chief complaint on file.   History of Present Illness:    Whitney Daniel is a 63 y.o. female with a hx of PAF, hypertension, chronic anticoagulation, and fibromyxoid fibroma.  ***  Past Medical History:  Diagnosis Date  . A-fib ([place])   . Anemia   . Cancer ([place])   . Fibromyalgia   . Hypertension   . Hypothyroidism   . Osteoarthritis   . SVD (spontaneous vaginal delivery)    x 5    Past Surgical History:  Procedure Laterality Date  . COLONOSCOPY    . DILATATION & CURRETTAGE/HYSTEROSCOPY WITH RESECTOCOPE N/A 07/15/2014   Procedure: DILATATION & CURETTAGE/HYSTEROSCOPY WITH RESECTOCOPE;  Surgeon: Marvene Staff, MD;  Location: [facility];  Service: Gynecology;  Laterality: N/A;  . DILATION AND CURETTAGE OF UTERUS     polyps  . LEFT HEART CATH AND CORONARY ANGIOGRAPHY N/A 04/16/2018   Procedure: LEFT HEART CATH AND CORONARY ANGIOGRAPHY;  Surgeon: Belva Crome, MD;  Location: [facility];  Service: Cardiovascular;  Laterality: N/A;  . right thigh surgery     cancer  . WISDOM TOOTH EXTRACTION      Current Medications: No outpatient medications have been marked as taking for the 04/21/19 encounter (Appointment) with Belva Crome, MD.     Allergies:   Codeine and Vicodin [hydrocodone-acetaminophen]   Social History   Socioeconomic History  . Marital status: Married    Spouse name: Not on file  . Number of children: Not on file  . Years of education: Not on file  . Highest education level: Not on file  Occupational History  . Not on file  Social Needs  . Financial resource strain: Not on file  . Food insecurity    Worry: Not on file    Inability: Not on file  . Transportation needs    Medical: Not on file    Non-medical: Not on file  Tobacco Use   . Smoking status: Never Smoker  . Smokeless tobacco: Never Used  Substance and Sexual Activity  . Alcohol use: Yes    Alcohol/week: 0.0 standard drinks    Comment: social - wine  . Drug use: No  . Sexual activity: Yes    Birth control/protection: Post-menopausal  Lifestyle  . Physical activity    Days per week: Not on file    Minutes per session: Not on file  . Stress: Not on file  Relationships  . Social Herbalist on phone: Not on file    Gets together: Not on file    Attends religious service: Not on file    Active member of club or organization: Not on file    Attends meetings of clubs or organizations: Not on file    Relationship status: Not on file  Other Topics Concern  . Not on file  Social History Narrative   Lives alone in a one story home.  Has 5 children.  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      Family History: The patient's family history includes CVA in her mother; Cancer in her father; Diabetes in her father and sister; Heart  attack in her sister; Hypertension in her mother.  ROS:   Please see the history of present illness.    *** All other systems reviewed and are negative.  EKGs/Labs/Other Studies Reviewed:    The following studies were reviewed today: ***  EKG:  EKG ***  Recent Labs: No results found for requested labs within last 8760 hours.  Recent Lipid Panel    Component Value Date/Time   CHOL 206 (H) 04/16/2018 0605   TRIG 81 04/16/2018 0605   HDL 46 04/16/2018 0605   CHOLHDL 4.5 04/16/2018 0605   VLDL 16 04/16/2018 0605   LDLCALC 144 (H) 04/16/2018 0605    Physical Exam:    VS:  There were no vitals taken for this visit.    Wt Readings from Last 3 Encounters:  12/19/18 202 lb 8 oz (91.9 kg)  11/13/18 206 lb 9.6 oz (93.7 kg)  05/17/18 210 lb 6.4 oz (95.4 kg)     GEN: ***. No acute distress HEENT: Normal NECK: No JVD.  LYMPHATICS: No lymphadenopathy CARDIAC: *** RRR without murmur, gallop, or edema. VASCULAR: *** Normal Pulses. No bruits. RESPIRATORY:  Clear to auscultation without rales, wheezing or rhonchi  ABDOMEN: Soft, non-tender, non-distended, No pulsatile mass, MUSCULOSKELETAL: No deformity  SKIN: Warm and dry NEUROLOGIC:  Alert and oriented x 3 PSYCHIATRIC:  Normal affect   ASSESSMENT:    1. Coronary artery disease of native artery of native heart with stable angina pectoris (HCC)   2. Paroxysmal atrial fibrillation (HCC)   3. Chest pain of uncertain etiology   4. Essential hypertension   5. Frequent PVCs   6. Educated About Covid-19 Virus Infection    PLAN:    In order of problems listed above:  1. ***   Medication Adjustments/Labs and Tests Ordered: Current medicines are reviewed at length with the patient today.  Concerns regarding medicines are outlined above.  No orders of the defined types were placed in this encounter.  No orders of the defined types were placed in this encounter.   There are no Patient Instructions on file for this visit.   Signed, Sinclair Grooms, MD  04/20/2019 11:56 AM    [place]

## 2019-04-21 ENCOUNTER — Ambulatory Visit: Payer: BLUE CROSS/BLUE SHIELD | Admitting: Interventional Cardiology

## 2019-04-21 DIAGNOSIS — I10 Essential (primary) hypertension: Secondary | ICD-10-CM | POA: Diagnosis not present

## 2019-04-21 DIAGNOSIS — R7303 Prediabetes: Secondary | ICD-10-CM | POA: Diagnosis not present

## 2019-04-21 DIAGNOSIS — E7800 Pure hypercholesterolemia, unspecified: Secondary | ICD-10-CM | POA: Diagnosis not present

## 2019-04-21 DIAGNOSIS — E039 Hypothyroidism, unspecified: Secondary | ICD-10-CM | POA: Diagnosis not present

## 2019-04-21 DIAGNOSIS — M2550 Pain in unspecified joint: Secondary | ICD-10-CM | POA: Diagnosis not present

## 2019-04-21 DIAGNOSIS — J45909 Unspecified asthma, uncomplicated: Secondary | ICD-10-CM | POA: Diagnosis not present

## 2019-05-07 DIAGNOSIS — E7800 Pure hypercholesterolemia, unspecified: Secondary | ICD-10-CM | POA: Diagnosis not present

## 2019-05-07 DIAGNOSIS — E039 Hypothyroidism, unspecified: Secondary | ICD-10-CM | POA: Diagnosis not present

## 2019-05-07 DIAGNOSIS — R7303 Prediabetes: Secondary | ICD-10-CM | POA: Diagnosis not present

## 2019-05-21 DIAGNOSIS — M47817 Spondylosis without myelopathy or radiculopathy, lumbosacral region: Secondary | ICD-10-CM | POA: Diagnosis not present

## 2019-05-21 DIAGNOSIS — M2550 Pain in unspecified joint: Secondary | ICD-10-CM | POA: Diagnosis not present

## 2019-05-21 DIAGNOSIS — G894 Chronic pain syndrome: Secondary | ICD-10-CM | POA: Diagnosis not present

## 2019-05-21 DIAGNOSIS — M170 Bilateral primary osteoarthritis of knee: Secondary | ICD-10-CM | POA: Diagnosis not present

## 2019-05-30 NOTE — Progress Notes (Deleted)
CARDIOLOGY OFFICE NOTE  Date:  05/30/2019    Whitney Daniel Date of Birth: 10/13/1955 Medical Record [id number]  PCP:  London Pepper, MD  Cardiologist:  Zack Seal chief complaint on file.   History of Present Illness: Whitney Daniel is a 63 y.o. female who presents today for a follow up visit. Seen for Dr. Tamala Julian.   She has a history of PAF, hypertension, chronic anticoagulation, and fibromyxoid fibroma.  She was last seen by a telehealth visit by Dr. Tamala Julian in April - she was having some continuous chest discomfort - worse with twisting and bending. Also some dizziness and had hit her head on a bed railing.   The patient {does/does not:200015} have symptoms concerning for COVID-19 infection (fever, chills, cough, or new shortness of breath).   Comes in today. Here with   Past Medical History:  Diagnosis Date  . A-fib ([place])   . Anemia   . Cancer ([place])   . Fibromyalgia   . Hypertension   . Hypothyroidism   . Osteoarthritis   . SVD (spontaneous vaginal delivery)    x 5    Past Surgical History:  Procedure Laterality Date  . COLONOSCOPY    . DILATATION & CURRETTAGE/HYSTEROSCOPY WITH RESECTOCOPE N/A 07/15/2014   Procedure: DILATATION & CURETTAGE/HYSTEROSCOPY WITH RESECTOCOPE;  Surgeon: Marvene Staff, MD;  Location: [facility];  Service: Gynecology;  Laterality: N/A;  . DILATION AND CURETTAGE OF UTERUS     polyps  . LEFT HEART CATH AND CORONARY ANGIOGRAPHY N/A 04/16/2018   Procedure: LEFT HEART CATH AND CORONARY ANGIOGRAPHY;  Surgeon: Belva Crome, MD;  Location: [facility];  Service: Cardiovascular;  Laterality: N/A;  . right thigh surgery     cancer  . WISDOM TOOTH EXTRACTION       Medications: No outpatient medications have been marked as taking for the 06/02/19 encounter (Appointment) with Burtis Junes, NP.     Allergies: Allergies  Allergen Reactions  . Codeine Other (See Comments)    Patient states "she did not like the way  codeine made her feel"    . Vicodin [Hydrocodone-Acetaminophen] Nausea And Vomiting    Doesn't like how it makes her feel but can take if needs to for pain    Social History: The patient  reports that she has never smoked. She has never used smokeless tobacco. She reports current alcohol use. She reports that she does not use drugs.   Family History: The patient's ***family history includes CVA in her mother; Cancer in her father; Diabetes in her father and sister; Heart attack in her sister; Hypertension in her mother.   Review of Systems: Please see the history of present illness.   All other systems are reviewed and negative.   Physical Exam: VS:  There were no vitals taken for this visit. Marland Kitchen  BMI There is no height or weight on file to calculate BMI.  Wt Readings from Last 3 Encounters:  12/19/18 202 lb 8 oz (91.9 kg)  11/13/18 206 lb 9.6 oz (93.7 kg)  05/17/18 210 lb 6.4 oz (95.4 kg)    General: Pleasant. Well developed, well nourished and in no acute distress.   HEENT: Normal.  Neck: Supple, no JVD, carotid bruits, or masses noted.  Cardiac: ***Regular rate and rhythm. No murmurs, rubs, or gallops. No edema.  Respiratory:  Lungs are clear to auscultation bilaterally with normal work of breathing.  GI: Soft and nontender.  MS: No deformity or  atrophy. Gait and ROM intact.  Skin: Warm and dry. Color is normal.  Neuro:  Strength and sensation are intact and no gross focal deficits noted.  Psych: Alert, appropriate and with normal affect.   LABORATORY DATA:  EKG:  EKG {ACTION; IS/IS GI:087931 ordered today. This demonstrates ***.  Lab Results  Component Value Date   WBC 6.4 04/16/2018   HGB 11.9 (L) 04/16/2018   HCT 37.7 04/16/2018   PLT 252 04/16/2018   GLUCOSE 146 (H) 04/17/2018   CHOL 206 (H) 04/16/2018   TRIG 81 04/16/2018   HDL 46 04/16/2018   LDLCALC 144 (H) 04/16/2018   ALT 15 04/16/2018   AST 22 04/16/2018   NA 135 04/17/2018   K 3.9 04/17/2018   CL  101 04/17/2018   CREATININE 0.94 04/17/2018   BUN 10 04/17/2018   CO2 25 04/17/2018   INR 1.06 04/16/2018   HGBA1C 6.0 (H) 04/16/2018       BNP (last 3 results) No results for input(s): BNP in the last 8760 hours.  ProBNP (last 3 results) No results for input(s): PROBNP in the last 8760 hours.   Other Studies Reviewed Today:  Echocardiogram 10/29/2018: Left ventricle cavity is normal in size. Normal global wall motion. Normal diastolic filling pattern. Calculated EF 55%. Left atrial cavity is mildly dilated. Aneurysmal interatrial septum. PFO cannot be excluded.  Right atrial cavity is mildly dilated. Mild tricuspid regurgitation.  No evidence of pulmonary hypertension.  LEFT HEART CATH AND CORONARY ANGIOGRAPHY 04/2018  Conclusion   Right dominant coronary system.  40 to 50% ostial RCA stenosis.  Normal left main.  Apical 70% LAD after the third diagonal branch.  The LAD and diagonals are otherwise normal.  Widely patent circumflex artery without significant obstruction.  Normal left ventricular systolic function.  EF greater than 50%.  LVEDP.  RECOMMENDATIONS:   Medical therapy for moderate coronary disease.  Symptoms could be related to the distal/apical LAD lesion.  Consider beta-blocker or long-acting nitrates if recurring episodes of chest pain.  Is also conceivable the chest pain was not cardiac in nature.  Aggressive risk factor modification: LDL less than 70, aspirin 81 mg daily, blood pressure control to 130/80 mmHg or less, hemoglobin A1c less than 70, aerobic activity as tolerated.  Recommend Aspirin 81mg  daily for moderate CAD.    Assessment/Plan:  1. PAF  2. CAD  3. Chest pain  4. HTN  5. 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 Chronic anticoagulation with Eliquis  8. Notation of prior unrecognized embolic CVA -   9. COVID-19 Education: The signs and symptoms of COVID-19 were discussed with the patient and how to seek  care for testing (follow up with PCP or arrange E-visit).  The importance of social distancing, staying at home, hand hygiene and wearing a mask when out in public were discussed today.  Current medicines are reviewed with the patient today.  The patient does not have concerns regarding medicines other than what has been noted above.  The following changes have been made:  See above.  Labs/ tests ordered today include:   No orders of the defined types were placed in this encounter.    Disposition:   FU with *** in {gen number [id number] {Days to years:10300}.   Patient is agreeable to this plan and will call if any problems develop in the interim.   SignedTruitt Merle, NP  05/30/2019 8:14 AM  [facility]  [address] [place], [place]  [address] Phone: [phone] Fax: [phone]

## 2019-06-02 ENCOUNTER — Ambulatory Visit: Payer: BC Managed Care – PPO | Admitting: Nurse Practitioner

## 2019-06-11 DIAGNOSIS — Z79899 Other long term (current) drug therapy: Secondary | ICD-10-CM | POA: Diagnosis not present

## 2019-06-11 DIAGNOSIS — Z79891 Long term (current) use of opiate analgesic: Secondary | ICD-10-CM | POA: Diagnosis not present

## 2019-06-11 DIAGNOSIS — G894 Chronic pain syndrome: Secondary | ICD-10-CM | POA: Diagnosis not present

## 2019-06-11 DIAGNOSIS — M170 Bilateral primary osteoarthritis of knee: Secondary | ICD-10-CM | POA: Diagnosis not present

## 2019-06-12 ENCOUNTER — Telehealth: Payer: Self-pay | Admitting: Interventional Cardiology

## 2019-06-12 NOTE — Telephone Encounter (Signed)
Spoke with the patient who reports hitting her head very hard on trunk of her vehicle. She states she rarely has headaches but has had a headache since hitting her head two days ago which has gotten progressively worse. The patient is on Eliquis and is concerned about bleeding.  I advised the patient to go to the ED to be evaluated. She verbalized understanding.

## 2019-06-12 NOTE — Telephone Encounter (Signed)
°  Pt c/o medication issue:  1. Name of Medication: ELIQUIS 5 MG TABS tablet  2. How are you currently taking this medication (dosage and times per day)? 10 mg 2x daily  3. Are you having a reaction (difficulty breathing--STAT)? no  4. What is your medication issue? Patient is worried about a continuing headache. Patient hit her head two days ago. She got a localized headache, but it has started to spread. She takes Eliquis and is worried about possible bleeding. She does not know if she should go to the ER. The office did not have any openings until Tuesday

## 2019-06-13 NOTE — Telephone Encounter (Signed)
Agree with instruction

## 2019-06-25 ENCOUNTER — Telehealth: Payer: Self-pay | Admitting: *Deleted

## 2019-06-25 NOTE — Progress Notes (Deleted)
Virtual Visit via Video Note   This visit type was conducted due to national recommendations for restrictions regarding the COVID-19 Pandemic (e.g. social distancing) in an effort to limit this patient's exposure and mitigate transmission in our community.  Due to her co-morbid illnesses, this patient is at least at moderate risk for complications without adequate follow up.  This format is felt to be most appropriate for this patient at this time.  All issues noted in this document were discussed and addressed.  A limited physical exam was performed with this format.  Please refer to the patient's chart for her consent to telehealth for [facility].   Date:  06/25/2019    [name] Date of Birth: 1956-02-19 Medical Record [id number]  PCP:  London Pepper, MD  Cardiologist:  Jennings Books  No chief complaint on file.   History of Present Illness: Whitney Daniel is a 63 y.o. female who presents today for a telehealth visit. Seen for Dr. Tamala Julian.   She has a history of PAF, HTN,on chronic anticoagulation,CAD per prior cath in 2019 and fibromyxoid fibroma.  She has been having chest discomfort.  She has been having chest discomfort, continuous.  It is made somewhat worse by physical activity.  She is also been dizzy and had an episode where she hit her head on the bed railing.  We discussed the chest discomfort and he gets worse every time she walks and her left foot hits the ground.  Bending and twisting also influences the discomfort.  She is been having a headache on the top of her head 4 days.  This preexisted the recent bump that she received on the end of her bed.  She denies shortness of breath, neurological symptoms, and lower extremity swelling The patient {does/does not:200015} have symptoms concerning for COVID-19 infection (fever, chills, cough, or new shortness of breath).   Comes in today. Here with   Past Medical History:  Diagnosis Date  . A-fib ([place])   .  Anemia   . Cancer ([place])   . Fibromyalgia   . Hypertension   . Hypothyroidism   . Osteoarthritis   . SVD (spontaneous vaginal delivery)    x 5    Past Surgical History:  Procedure Laterality Date  . COLONOSCOPY    . DILATATION & CURRETTAGE/HYSTEROSCOPY WITH RESECTOCOPE N/A 07/15/2014   Procedure: DILATATION & CURETTAGE/HYSTEROSCOPY WITH RESECTOCOPE;  Surgeon: Marvene Staff, MD;  Location: [facility];  Service: Gynecology;  Laterality: N/A;  . DILATION AND CURETTAGE OF UTERUS     polyps  . LEFT HEART CATH AND CORONARY ANGIOGRAPHY N/A 04/16/2018   Procedure: LEFT HEART CATH AND CORONARY ANGIOGRAPHY;  Surgeon: Belva Crome, MD;  Location: [facility];  Service: Cardiovascular;  Laterality: N/A;  . right thigh surgery     cancer  . WISDOM TOOTH EXTRACTION       Medications: No outpatient medications have been marked as taking for the 06/30/19 encounter (Appointment) with Burtis Junes, NP.     Allergies: Allergies  Allergen Reactions  . Codeine Other (See Comments)    Patient states "she did not like the way codeine made her feel"    . Vicodin [Hydrocodone-Acetaminophen] Nausea And Vomiting    Doesn't like how it makes her feel but can take if needs to for pain    Social History: The patient  reports that she has never smoked. She has never used smokeless tobacco. She reports current alcohol use.  She reports that she does not use drugs.   Family History: The patient's ***family history includes CVA in her mother; Cancer in her father; Diabetes in her father and sister; Heart attack in her sister; Hypertension in her mother.   Review of Systems: Please see the history of present illness.   All other systems are reviewed and negative.   Physical Exam: VS:  There were no vitals taken for this visit. Marland Kitchen  BMI There is no height or weight on file to calculate BMI.  Wt Readings from Last 3 Encounters:  12/19/18 202 lb 8 oz (91.9 kg)  11/13/18 206 lb 9.6 oz (93.7  kg)  05/17/18 210 lb 6.4 oz (95.4 kg)    General: Pleasant. Well developed, well nourished and in no acute distress.   HEENT: Normal.  Neck: Supple, no JVD, carotid bruits, or masses noted.  Cardiac: ***Regular rate and rhythm. No murmurs, rubs, or gallops. No edema.  Respiratory:  Lungs are clear to auscultation bilaterally with normal work of breathing.  GI: Soft and nontender.  MS: No deformity or atrophy. Gait and ROM intact.  Skin: Warm and dry. Color is normal.  Neuro:  Strength and sensation are intact and no gross focal deficits noted.  Psych: Alert, appropriate and with normal affect.   LABORATORY DATA:  EKG:  EKG {ACTION; IS/IS [id number] ordered today. This demonstrates ***.  Lab Results  Component Value Date   WBC 6.4 04/16/2018   HGB 11.9 (L) 04/16/2018   HCT 37.7 04/16/2018   PLT 252 04/16/2018   GLUCOSE 146 (H) 04/17/2018   CHOL 206 (H) 04/16/2018   TRIG 81 04/16/2018   HDL 46 04/16/2018   LDLCALC 144 (H) 04/16/2018   ALT 15 04/16/2018   AST 22 04/16/2018   NA 135 04/17/2018   K 3.9 04/17/2018   CL 101 04/17/2018   CREATININE 0.94 04/17/2018   BUN 10 04/17/2018   CO2 25 04/17/2018   INR 1.06 04/16/2018   HGBA1C 6.0 (H) 04/16/2018     BNP (last 3 results) No results for input(s): BNP in the last 8760 hours.  ProBNP (last 3 results) No results for input(s): PROBNP in the last 8760 hours.   Other Studies Reviewed Today:  Echocardiogram 10/29/2018: Left ventricle cavity is normal in size. Normal global wall motion. Normal diastolic filling pattern. Calculated EF 55%. Left atrial cavity is mildly dilated. Aneurysmal interatrial septum. PFO cannot be excluded.  Right atrial cavity is mildly dilated. Mild tricuspid regurgitation.  No evidence of pulmonary hypertension.  LEFT HEART CATH AND CORONARY ANGIOGRAPHY 04/2018  Conclusion   Right dominant coronary system.  40 to 50% ostial RCA stenosis.  Normal left main.  Apical 70% LAD after the  third diagonal branch.  The LAD and diagonals are otherwise normal.  Widely patent circumflex artery without significant obstruction.  Normal left ventricular systolic function.  EF greater than 50%.  LVEDP.  RECOMMENDATIONS:   Medical therapy for moderate coronary disease.  Symptoms could be related to the distal/apical LAD lesion.  Consider beta-blocker or long-acting nitrates if recurring episodes of chest pain.  Is also conceivable the chest pain was not cardiac in nature.  Aggressive risk factor modification: LDL less than 70, aspirin 81 mg daily, blood pressure control to 130/80 mmHg or less, hemoglobin A1c less than 70, aerobic activity as tolerated.  Recommend Aspirin 81mg  daily for moderate CAD.   ASSESSMENT & PLAN:    1. CAD  2. PAF - previously on CCB and has  been on Sotalol in the past -   3, HTN  4. Chronic anticoagulation  5. Fibromyxoid sarcoma  6. Known PVCs  7. Prior embolic CVA - on chronic anticoagulation  8. COVID-19 Education: The signs and symptoms of COVID-19 were discussed with the patient and how to seek care for testing (follow up with PCP or arrange E-visit).  The importance of social distancing, staying at home, hand hygiene and wearing a mask when out in public were discussed today.  Time:   Today, I have spent ***  minutes with the patient with telehealth technology discussing the above problems.    Current medicines are reviewed with the patient today.  The patient does not have concerns regarding medicines other than what has been noted above.  The following changes have been made:  See above.  Labs/ tests ordered today include:   No orders of the defined types were placed in this encounter.    Disposition:   FU with *** in {gen number [id number] {Days to years:10300}.   Patient is agreeable to this plan and will call if any problems develop in the interim.   SignedTruitt Merle, NP  06/25/2019 6:58 AM  [facility] [address] [place],   [address] Phone: [phone] Fax: [phone]

## 2019-06-25 NOTE — Telephone Encounter (Signed)
Noted. Ok to change if she prefers.   Whitney Daniel

## 2019-06-25 NOTE — Telephone Encounter (Signed)
Patient has an upcoming appointment on Monday, October 26 with Lori at 9:30 am, pt would like to do a telehealth visit.  Will send to Cecille Rubin to advise.   Pt would like a call back ASAP.  Please forward to [place] or KK to call pt back, will be covering out basket.

## 2019-06-29 NOTE — Progress Notes (Signed)
Telehealth Visit     Virtual Visit via Video Note   This visit type was conducted due to national recommendations for restrictions regarding the COVID-19 Pandemic (e.g. social distancing) in an effort to limit this patient's exposure and mitigate transmission in our community.  Due to her co-morbid illnesses, this patient is at least at moderate risk for complications without adequate follow up.  This format is felt to be most appropriate for this patient at this time.  All issues noted in this document were discussed and addressed.  A limited physical exam was performed with this format.  Please refer to the patient's chart for her consent to telehealth for [facility].   Evaluation Performed:  Follow-up visit  This visit type was conducted due to national recommendations for restrictions regarding the COVID-19 Pandemic (e.g. social distancing).  This format is felt to be most appropriate for this patient at this time.  All issues noted in this document were discussed and addressed.  No physical exam was performed (except for noted visual exam findings with Video Visits).  Please refer to the patient's chart (MyChart message for video visits and phone note for telephone visits) for the patient's consent to telehealth for [facility].  Date:  06/30/2019   ID:  Fletcher, Saccomanno 05-15-1956, MRN [id number]  Patient Location:  Home  Provider location:   Office at [facility]  PCP:  London Pepper, MD  Cardiologist:  Servando Snare & Sinclair Grooms, MD  Electrophysiologist:  None   Chief Complaint:  Follow up.  History of Present Illness:    Whitney Daniel is a 63 y.o. female who presents via audio/video conferencing for a telehealth visit today.  Seen for Dr. Tamala Julian.   She has a history of moderate CAD per cath in 04/2018 - managed medically, PAF, HTN, chronic anticoaguation, and fibromyxoid fibroma.   Last seen by Dr. Tamala Julian by telehealth visit in April - had lots of atypical  symptoms including chest pain - overall felt to be stable from our standpoint.   The patient does not have symptoms concerning for COVID-19 infection (fever, chills, cough, or new shortness of breath).   Seen today via D[employer] video. She has previously consented for this visit. She is doing well. Still with chest pain - says she will have a couple of days of chest pain - depending on what is going on - says it is "mild". Then just goes away. She does use NTG on occasion - does not really quantify. May use a couple of times of week - seems about the same as she was with her visit in April. She notes some occasional palpitations at times - has to stop and rest - notes it will be beating fast for "a bit". It is hard for her to say if she is actually about the same as she was back in April or not. She wonders "if that blockage" is worse.    Past Medical History:  Diagnosis Date   A-fib ([place])    Anemia    Cancer ([place])    Fibromyalgia    Hypertension    Hypothyroidism    Osteoarthritis    SVD (spontaneous vaginal delivery)    x 5   Past Surgical History:  Procedure Laterality Date   COLONOSCOPY     DILATATION & CURRETTAGE/HYSTEROSCOPY WITH RESECTOCOPE N/A 07/15/2014   Procedure: DILATATION & CURETTAGE/HYSTEROSCOPY WITH RESECTOCOPE;  Surgeon: Marvene Staff, MD;  Location: [facility];  Service: Gynecology;  Laterality: N/A;   DILATION AND CURETTAGE OF UTERUS     polyps   LEFT HEART CATH AND CORONARY ANGIOGRAPHY N/A 04/16/2018   Procedure: LEFT HEART CATH AND CORONARY ANGIOGRAPHY;  Surgeon: Belva Crome, MD;  Location: [facility];  Service: Cardiovascular;  Laterality: N/A;   right thigh surgery     cancer   WISDOM TOOTH EXTRACTION       Current Meds  Medication Sig   amLODipine (NORVASC) 5 MG tablet Take 1 tablet (5 mg total) by mouth daily.   Ascorbic Acid (VITAMIN C) 500 MG CHEW Chew 500 mg by mouth daily.   atorvastatin (LIPITOR) 80 MG tablet TAKE 1  TABLET BY MOUTH EVERY DAY   Biotin 10000 MCG TABS Take 10,000 mcg by mouth daily.   budesonide-formoterol (SYMBICORT) 160-4.5 MCG/ACT inhaler Inhale 2 puffs into the lungs daily as needed (shortness of breath or wheezing).    ELIQUIS 5 MG TABS tablet TAKE 1 TABLET BY MOUTH TWICE A DAY   levothyroxine (SYNTHROID, LEVOTHROID) 75 MCG tablet Take 75 mcg by mouth daily before breakfast.    losartan-hydrochlorothiazide (HYZAAR) 50-12.5 MG tablet Take 1 tablet by mouth daily.   Multiple Vitamin (MULTIVITAMIN WITH MINERALS) TABS Take 1 tablet by mouth daily.   nitroGLYCERIN (NITROSTAT) 0.4 MG SL tablet TAKE 1 TABLET BY MOUTH EVERY 5 MINUTES AS NEEDED FOR CHEST PAIN   pregabalin (LYRICA) 75 MG capsule Take 75 mg by mouth 3 (three) times daily.   traMADol (ULTRAM) 50 MG tablet Take 1 tablet (50 mg total) by mouth 2 (two) times daily as needed for moderate pain.   vitamin E 400 UNIT capsule Take 400 Units by mouth daily.     Allergies:   Codeine and Vicodin [hydrocodone-acetaminophen]   Social History   Tobacco Use   Smoking status: Never Smoker   Smokeless tobacco: Never Used  Substance Use Topics   Alcohol use: Yes    Alcohol/week: 0.0 standard drinks    Comment: social - wine   Drug use: No     Family Hx: The patient's family history includes CVA in her mother; Cancer in her father; Diabetes in her father and sister; Heart attack in her sister; Hypertension in her mother.  ROS:   Please see the history of present illness.   All other systems reviewed are negative.    Objective:    Vital Signs:  BP 132/76    Pulse 77    Ht 5\' 8"  (1.727 m)    Wt 205 lb 6.4 oz (93.2 kg)    BMI 31.23 kg/m    Wt Readings from Last 3 Encounters:  06/30/19 205 lb 6.4 oz (93.2 kg)  12/19/18 202 lb 8 oz (91.9 kg)  11/13/18 206 lb 9.6 oz (93.7 kg)    Alert female in no acute distress. Not short of breath with conversation. Appropriate.    Labs/Other Tests and Data Reviewed:    Lab  Results  Component Value Date   WBC 6.4 04/16/2018   HGB 11.9 (L) 04/16/2018   HCT 37.7 04/16/2018   PLT 252 04/16/2018   GLUCOSE 146 (H) 04/17/2018   CHOL 206 (H) 04/16/2018   TRIG 81 04/16/2018   HDL 46 04/16/2018   LDLCALC 144 (H) 04/16/2018   ALT 15 04/16/2018   AST 22 04/16/2018   NA 135 04/17/2018   K 3.9 04/17/2018   CL 101 04/17/2018   CREATININE 0.94 04/17/2018   BUN 10 04/17/2018   CO2 25 04/17/2018  INR 1.06 04/16/2018   HGBA1C 6.0 (H) 04/16/2018       BNP (last 3 results) No results for input(s): BNP in the last 8760 hours.  ProBNP (last 3 results) No results for input(s): PROBNP in the last 8760 hours.    Prior CV studies:    The following studies were reviewed today:  Echocardiogram 10/29/2018: Left ventricle cavity is normal in size. Normal global wall motion. Normal diastolic filling pattern. Calculated EF 55%. Left atrial cavity is mildly dilated. Aneurysmal interatrial septum. PFO cannot be excluded.  Right atrial cavity is mildly dilated. Mild tricuspid regurgitation.  No evidence of pulmonary hypertension.  LEFT HEART CATH AND CORONARY ANGIOGRAPHY 04/2018  Conclusion   Right dominant coronary system.  40 to 50% ostial RCA stenosis.  Normal left main.  Apical 70% LAD after the third diagonal branch.  The LAD and diagonals are otherwise normal.  Widely patent circumflex artery without significant obstruction.  Normal left ventricular systolic function.  EF greater than 50%.  LVEDP.  RECOMMENDATIONS:   Medical therapy for moderate coronary disease.  Symptoms could be related to the distal/apical LAD lesion.  Consider beta-blocker or long-acting nitrates if recurring episodes of chest pain.  Is also conceivable the chest pain was not cardiac in nature.  Aggressive risk factor modification: LDL less than 70, aspirin 81 mg daily, blood pressure control to 130/80 mmHg or less, hemoglobin A1c less than 70, aerobic activity as  tolerated.  Recommend Aspirin 81mg  daily for moderate CAD.      ASSESSMENT & PLAN:    1.  PAF - previously on CCB and soltalol - no longer taking - HR is ok - some palpitations noted.   2. CAD - moderate per cath last year - she endorses lots of symptoms - hard to sort out over the phone. I think she needs an office visit with an EKG to further evaluate. She is agreeable.   3. HTN - BP is fine. No changes made.   4. HLD - on statin - labs from September from the [facility] are noted -   5. Chronic anticoagulation - on Eliquis - last hemoglobin was ok back in April.   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   7. 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   9. COVID-19 Education: The signs and symptoms of COVID-19 were discussed with the patient and how to seek care for testing (follow up with PCP or arrange E-visit).  The importance of social distancing, staying at home, hand hygiene and wearing a mask when out in public were discussed today.  Patient Risk:   After full review of this patient's clinical status, I feel that they are at least moderate risk at this time.  Time:   Today, I have spent 7 minutes with the patient with telehealth technology discussing the above issues.     Medication Adjustments/Labs and Tests Ordered: Current medicines are reviewed at length with the patient today.  Concerns regarding medicines are outlined above.   Tests Ordered: No orders of the defined types were placed in this encounter.   Medication Changes: No orders of the defined types were placed in this encounter.   Disposition:  Needs OV with Dr. Tamala Julian or PA/NP in next week or so. Will arrange.    Patient is agreeable to this plan and will call if any problems develop in the interim.   Amie Critchley, NP  06/30/2019 9:51 AM  [facility]

## 2019-06-30 ENCOUNTER — Encounter: Payer: Self-pay | Admitting: Nurse Practitioner

## 2019-06-30 ENCOUNTER — Other Ambulatory Visit: Payer: Self-pay

## 2019-06-30 ENCOUNTER — Telehealth (INDEPENDENT_AMBULATORY_CARE_PROVIDER_SITE_OTHER): Payer: BC Managed Care – PPO | Admitting: Nurse Practitioner

## 2019-06-30 VITALS — BP 132/76 | HR 77 | Ht 68.0 in | Wt 205.4 lb

## 2019-06-30 DIAGNOSIS — I480 Paroxysmal atrial fibrillation: Secondary | ICD-10-CM

## 2019-06-30 DIAGNOSIS — I10 Essential (primary) hypertension: Secondary | ICD-10-CM

## 2019-06-30 DIAGNOSIS — I25118 Atherosclerotic heart disease of native coronary artery with other forms of angina pectoris: Secondary | ICD-10-CM

## 2019-06-30 DIAGNOSIS — Z7189 Other specified counseling: Secondary | ICD-10-CM

## 2019-06-30 NOTE — Patient Instructions (Addendum)
After Visit Summary:  We will be checking the following labs today - NONE   Medication Instructions:    Continue with your current medicines.    If you need a refill on your cardiac medications before your next appointment, please call your pharmacy.     Testing/Procedures To Be Arranged:  N/A  Follow-Up:   See [place] back in the office in the next week or so - we will be contacting you with an appointment    At [facility], you and your health needs are our priority.  As part of our continuing mission to provide you with exceptional heart care, we have created designated Provider Care Teams.  These Care Teams include your primary Cardiologist (physician) and Advanced Practice Providers (APPs -  Physician Assistants and Nurse Practitioners) who all work together to provide you with the care you need, when you need it.  Special Instructions:  . Stay safe, stay home, wash your hands for at least 20 seconds and wear a mask when out in public.  . It was good to talk with you today.    Call the [place] office at [phone] if you have any questions, problems or concerns.

## 2019-07-02 NOTE — Progress Notes (Signed)
Cardiology Office Note   Date:  07/04/2019   ID:  Whitney Daniel, Whitney Daniel 1955-12-23, MRN [id number]  PCP:  London Pepper, MD  Cardiologist:  Dr. Tamala Julian, MD   Chief Complaint  Patient presents with  . Follow-up    History of Present Illness: Whitney Daniel is a 63 y.o. female who presents for the evaluation of chest pain.  Has a history of moderate CAD per cardiac cath 04/2018 with recommendations for medical management, PAF on chronic anticoagulation, HTN and fibromyxoid fibroma.  She was most recently seen by Truitt Merle 06/30/2019 for a telemedicine visit.  Prior to that she was seen by Dr. Tamala Julian 12/2018 in which she had lots of atypical symptoms including chest pain however overall felt to be stable from a cardiac standpoint.  Last cardiac catheterization 04/16/2018 with moderate CAD. Noted to have a right dominant coronary system with a 40 to 50% ostial RCA stenosis as well as an apical 70% LAD after the third diagonal branch with a widely patent left circumflex artery. Recommendations were for medical therapy for moderate coronary disease. Symptoms were thought to possibly be related to the distal/apical LAD lesion. Consider beta-blocker or long-acting nitrates if recurring episodes of chest pain. Also conceivable the chest pain was not cardiac in nature. She last underwent a myocardial perfusion study 09/25/2018 that showed exaggerated stress BP response at 214/84 with no ischemia however frequent PVCs on EKG and LVEF at 45% considered intermittent study due to low EF and PVCs with clinical correlation recommended. Echocardiogram performed 10/29/2018 with EF calculated at 55%.  On 06/30/2019 visit she continued to have chest pain that would last several days at a time depending on what was going on in her life at the time.  She reported using SL NTG on occasion, approximately several times a week which appears to be unchanged since last visit with Dr. Tamala Julian.  Also noted to have  occasional palpitations in which she would need to stop and rest as her heart would feel it was "beating fast ".  Overall, it was thought to be stable however needed an office visit for an EKG to further evaluate chest symptoms as well as palpitations.  Today, she presents with follow-up of chest pain from previous telemedicine visit as above.  States that she will at times have anginal symptoms that have exertional as well as nonexertional qualities with associated dyspnea on exertion. Reports she cannot clearly described the pain as a sharp, dull, ache or burning quality.  She has no pain radiation to her left arm, jaw or back. She does have exertional shortness of breath that has been progressive over the last several months. Per cath report, she has a distal LAD occlusion that was thought to benefit from the addition of BB or nitrate therapy if recurrence of angina. She does report taking her SL NTG several times over the last month with some relief. EKG with no new or acute changes. Reassuring. We discussed the addition of nitrate therapy, as this would likely benefit her. She agrees. Also discussed having Dr. Tamala Julian look over cath films for potential targets with close follow up of symptoms. ED precautions reviewed. She is stable today.   Past Medical History:  Diagnosis Date  . A-fib ([place])   . Anemia   . Cancer ([place])   . Fibromyalgia   . Hypertension   . Hypothyroidism   . Osteoarthritis   . SVD (spontaneous vaginal delivery)    x 5  Past Surgical History:  Procedure Laterality Date  . COLONOSCOPY    . DILATATION & CURRETTAGE/HYSTEROSCOPY WITH RESECTOCOPE N/A 07/15/2014   Procedure: DILATATION & CURETTAGE/HYSTEROSCOPY WITH RESECTOCOPE;  Surgeon: Marvene Staff, MD;  Location: [facility];  Service: Gynecology;  Laterality: N/A;  . DILATION AND CURETTAGE OF UTERUS     polyps  . LEFT HEART CATH AND CORONARY ANGIOGRAPHY N/A 04/16/2018   Procedure: LEFT HEART CATH AND CORONARY  ANGIOGRAPHY;  Surgeon: Belva Crome, MD;  Location: [facility];  Service: Cardiovascular;  Laterality: N/A;  . right thigh surgery     cancer  . WISDOM TOOTH EXTRACTION       Current Outpatient Medications  Medication Sig Dispense Refill  . amLODipine (NORVASC) 5 MG tablet Take 1 tablet (5 mg total) by mouth daily. 90 tablet 1  . Ascorbic Acid (VITAMIN C) 500 MG CHEW Chew 500 mg by mouth daily.    Marland Kitchen atorvastatin (LIPITOR) 80 MG tablet TAKE 1 TABLET BY MOUTH EVERY DAY 90 tablet 2  . Biotin 10000 MCG TABS Take 10,000 mcg by mouth daily.    . budesonide-formoterol (SYMBICORT) 160-4.5 MCG/ACT inhaler Inhale 2 puffs into the lungs daily as needed (shortness of breath or wheezing).     Marland Kitchen ELIQUIS 5 MG TABS tablet TAKE 1 TABLET BY MOUTH TWICE A DAY 60 tablet 5  . levothyroxine (SYNTHROID, LEVOTHROID) 75 MCG tablet Take 75 mcg by mouth daily before breakfast.     . losartan-hydrochlorothiazide (HYZAAR) 50-12.5 MG tablet Take 1 tablet by mouth daily.    . Multiple Vitamin (MULTIVITAMIN WITH MINERALS) TABS Take 1 tablet by mouth daily.    . nitroGLYCERIN (NITROSTAT) 0.4 MG SL tablet TAKE 1 TABLET BY MOUTH EVERY 5 MINUTES AS NEEDED FOR CHEST PAIN 25 tablet 3  . pregabalin (LYRICA) 75 MG capsule Take 75 mg by mouth 3 (three) times daily.    . traMADol (ULTRAM) 50 MG tablet Take 1 tablet (50 mg total) by mouth 2 (two) times daily as needed for moderate pain. 60 tablet 5  . vitamin E 400 UNIT capsule Take 400 Units by mouth daily.     No current facility-administered medications for this visit.     Allergies:   Codeine and Vicodin [hydrocodone-acetaminophen]    Social History:  The patient  reports that she has never smoked. She has never used smokeless tobacco. She reports current alcohol use. She reports that she does not use drugs.   Family History:  The patient's family history includes CVA in her mother; Cancer in her father; Diabetes in her father and sister; Heart attack in her  sister; Hypertension in her mother.    ROS:  Please see the history of present illness.   Otherwise, review of systems are positive for none.   All other systems are reviewed and negative.    PHYSICAL EXAM: VS:  There were no vitals taken for this visit. , BMI There is no height or weight on file to calculate BMI.   General: Well developed, well nourished, NAD Skin: Warm, dry, intact  Neck: Negative for carotid bruits. No JVD Lungs:Clear to ausculation bilaterally. No wheezes, rales, or rhonchi. Breathing is unlabored. Cardiovascular: RRR with S1 S2. No murmurs Extremities: No edema. No clubbing or cyanosis. DP pulses 2+ bilaterally Neuro: Alert and oriented. No focal deficits. No facial asymmetry. MAE spontaneously. Psych: Responds to questions appropriately with normal affect.     EKG:  EKG is ordered today. The ekg ordered today demonstrates NSR  with inferior Q and no acute changes. Similar to prior tracings dating back to 04/2018   Recent Labs: No results found for requested labs within last 8760 hours.    Lipid Panel    Component Value Date/Time   CHOL 206 (H) 04/16/2018 0605   TRIG 81 04/16/2018 0605   HDL 46 04/16/2018 0605   CHOLHDL 4.5 04/16/2018 0605   VLDL 16 04/16/2018 0605   LDLCALC 144 (H) 04/16/2018 0605      Wt Readings from Last 3 Encounters:  06/30/19 205 lb 6.4 oz (93.2 kg)  12/19/18 202 lb 8 oz (91.9 kg)  11/13/18 206 lb 9.6 oz (93.7 kg)     Other studies Reviewed: Additional studies/ records that were reviewed today include:   Echocardiogram 10/29/2018: Left ventricle cavity is normal in size. Normal global wall motion. Normal diastolic filling pattern. Calculated EF 55%. Left atrial cavity is mildly dilated. Aneurysmal interatrial septum. PFO cannot be excluded.  Right atrial cavity is mildly dilated. Mild tricuspid regurgitation.  No evidence of pulmonary hypertension.  LEFT HEART CATH AND CORONARY ANGIOGRAPHY 04/2018  Conclusion    Right dominant coronary system. 40 to 50% ostial RCA stenosis.  Normal left main.  Apical 70% LAD after the third diagonal branch. The LAD and diagonals are otherwise normal.  Widely patent circumflex artery without significant obstruction.  Normal left ventricular systolic function. EF greater than 50%. LVEDP.  RECOMMENDATIONS:   Medical therapy for moderate coronary disease. Symptoms could be related to the distal/apical LAD lesion. Consider beta-blocker or long-acting nitrates if recurring episodes of chest pain. Is also conceivable the chest pain was not cardiac in nature.  Aggressive risk factor modification: LDL less than 70, aspirin 81 mg daily, blood pressure control to 130/80 mmHg or less, hemoglobin A1c less than 70, aerobic activity as tolerated.  Recommend Aspirin 81mg  daily for moderate CAD.    ASSESSMENT AND PLAN:  1. Chest pain with history of CAD: -Recently underwent cardiac catheterization which showed distal/apical LAD lesion however medical management was recommended. -Thoughts were to consider adding beta-blocker long-acting nitrates to her regimen if recurrent symptoms -Recent stress testing 09/25/2018 with poor exercise tolerance and frequent PVCs on EKG with exercise.  Echocardiogram with normal LV function and normal wall motion -Recent OV 06/30/2019 with multiple cardiac complaints thought to be difficult to determine over the telephone therefore office visit was recommended for EKG further evaluation -EKG with no changes from prior tracings dating back to 04/2018 -Will add low dose Imdur to regimen and follow symptoms with close follow up   2.  PAF: -Previously noted to be on a calcium channel blockers and sotalol however was noted to no longer be taking these -On chronic anticoagulation with Eliquis>>reports compliance  -No acute bleeding noted  3.  HTN: -Stable, 112/72 -Continue current regimen with the addition of Imdur as above   4.  HLD:  -LAst LDL noted to be 54 on 05/07/2019  -Continue   5.  Chronic anticoagulation: -On chronic Eliquis -No acute bleeding symptoms  6.  Prior embolic CVA: -On anticoagulation   Current medicines are reviewed at length with the patient today.  The patient does not have concerns regarding medicines.  The following changes have been made:  Add Imdur 15mg  QD  Labs/ tests ordered today include: BMET No orders of the defined types were placed in this encounter.   Disposition:   FU with myself or Dr. Tamala Julian in 2 weeks    Signed, Kathyrn Drown, NP  07/04/2019  2:02 PM    [facility] [place], [place], [place]  [address] Phone: [phone]; Fax: [phone]

## 2019-07-04 ENCOUNTER — Other Ambulatory Visit: Payer: Self-pay

## 2019-07-04 ENCOUNTER — Ambulatory Visit (INDEPENDENT_AMBULATORY_CARE_PROVIDER_SITE_OTHER): Payer: BC Managed Care – PPO | Admitting: Cardiology

## 2019-07-04 ENCOUNTER — Encounter: Payer: Self-pay | Admitting: Cardiology

## 2019-07-04 VITALS — BP 112/72 | HR 93 | Ht 68.0 in | Wt 210.4 lb

## 2019-07-04 DIAGNOSIS — I480 Paroxysmal atrial fibrillation: Secondary | ICD-10-CM

## 2019-07-04 DIAGNOSIS — I10 Essential (primary) hypertension: Secondary | ICD-10-CM | POA: Diagnosis not present

## 2019-07-04 DIAGNOSIS — I493 Ventricular premature depolarization: Secondary | ICD-10-CM | POA: Diagnosis not present

## 2019-07-04 DIAGNOSIS — I25118 Atherosclerotic heart disease of native coronary artery with other forms of angina pectoris: Secondary | ICD-10-CM | POA: Diagnosis not present

## 2019-07-04 MED ORDER — ISOSORBIDE MONONITRATE ER 30 MG PO TB24: 15.0000 mg | ORAL_TABLET | Freq: Every day | ORAL | 3 refills | Status: DC

## 2019-07-04 NOTE — Patient Instructions (Signed)
Medication Instructions:  Your physician has recommended you make the following change in your medication:   Begin Imdur, 15mg  (half tablet) once daily.    Labwork: None ordered.  Testing/Procedures: None ordered.   Follow-Up: Your physician recommends that you schedule a follow-up appointment in:   2 weeks as a virtual visit with Kathyrn Drown, NP    Any Other Special Instructions Will Be Listed Below (If Applicable).     If you need a refill on your cardiac medications before your next appointment, please call your pharmacy.

## 2019-07-09 DIAGNOSIS — Z79899 Other long term (current) drug therapy: Secondary | ICD-10-CM | POA: Diagnosis not present

## 2019-07-09 DIAGNOSIS — G894 Chronic pain syndrome: Secondary | ICD-10-CM | POA: Diagnosis not present

## 2019-07-09 DIAGNOSIS — M170 Bilateral primary osteoarthritis of knee: Secondary | ICD-10-CM | POA: Diagnosis not present

## 2019-07-09 DIAGNOSIS — Z79891 Long term (current) use of opiate analgesic: Secondary | ICD-10-CM | POA: Diagnosis not present

## 2019-07-09 NOTE — Progress Notes (Signed)
Virtual Visit via Telephone Note   This visit type was conducted due to national recommendations for restrictions regarding the COVID-19 Pandemic (e.g. social distancing) in an effort to limit this patient's exposure and mitigate transmission in our community.  Due to her co-morbid illnesses, this patient is at least at moderate risk for complications without adequate follow up.  This format is felt to be most appropriate for this patient at this time.  The patient did not have access to video technology/had technical difficulties with video requiring transitioning to audio format only (telephone).  All issues noted in this document were discussed and addressed.  No physical exam could be performed with this format.  Please refer to the patient's chart for her  consent to telehealth for [facility].   Date:  07/09/2019   ID:  Whitney, Daniel 12-20-1955, MRN [id number]  Patient Location: Home Provider Location: Home  PCP:  London Pepper, MD  Cardiologist:  Sinclair Grooms, MD Electrophysiologist:  None   Evaluation Performed:  Follow-Up Visit  Chief Complaint:  Follow up chest pain, seen for Dr. Tamala Julian   History of Present Illness:    Whitney Daniel is a 63 y.o. female with a history of moderate CAD per cardiac cath 04/2018 with recommendations for medical management, PAF on chronic anticoagulation, HTN and fibromyxoid fibroma.  She was most recently seen by Truitt Merle 06/30/2019 for a telemedicine visit.  Prior to that she was seen by Dr. Tamala Julian 12/2018 in which she had lots of atypical symptoms including chest pain however overall felt to be stable from a cardiac standpoint.  Last cardiac catheterization 04/16/2018 with moderate CAD. Noted to have a right dominant coronary system with a 40 to 50% ostial RCA stenosis as well as an apical 70% LAD after the third diagonal branch with a widely patent left circumflex artery. Recommendations were for medical therapy for moderate  coronary disease. Symptoms were thought to possibly be related to the distal/apical LAD lesion. Consider beta-blocker or long-acting nitrates if recurring episodes of chest pain. Also conceivable the chest pain was not cardiac in nature. She last underwent a myocardial perfusion study 09/25/2018 that showed exaggerated stress BP response at 214/84 with no ischemia however frequent PVCs on EKG and LVEF at 45% considered intermittent study due to low EF and PVCs with clinical correlation recommended. Echocardiogram performed 10/29/2018 with EF calculated at 55%.  On 06/30/2019 visit she continued to have chest pain that would last several days at a time depending on what was going on in her life at the time.  She reported using SL NTG on occasion, approximately several times a week which appears to be unchanged since last visit with Dr. Tamala Julian.  Also noted to have occasional palpitations in which she would need to stop and rest as her heart would feel it was "beating fast ".  Overall, it was thought to be stable however needed an office visit for an EKG to further evaluate chest symptoms as well as palpitations.  She was last seen by myself on 07/04/19 with reports that she would at times have anginal symptoms that have exertional as well as nonexertional qualities with associated dyspnea on exertion. Could not clearly define the symptoms quality.  She had no pain radiation to her left arm, jaw or back. Did have exertional shortness of breath that was progressive over the last several months. Per cath report, she has a distal LAD occlusion that was thought to benefit from the  addition of BB or nitrate therapy if recurrence of angina. She did report taking her SL NTG several times over the last month with some relief. EKG with no new or acute changes. Reassuring. We discussed the addition of nitrate therapy, as this would likely benefit her. She was started on Imdur 15mg  PO QD   Today [name] states that her  symptoms have much improved.  She is no longer short of breath with climbing stairs and is not having anginal symptoms.  Very reassuring.  Does not have access for BP cuff currently but typically states that her home BPs have been stable.  She is without dizziness or orthostatic symptoms.  Denies LE swelling, orthopnea, chest pain or other anginal symptoms.   The patient does not have symptoms concerning for COVID-19 infection (fever, chills, cough, or new shortness of breath).    Past Medical History:  Diagnosis Date  . A-fib ([place])   . Anemia   . Cancer ([place])   . Fibromyalgia   . Hypertension   . Hypothyroidism   . Osteoarthritis   . SVD (spontaneous vaginal delivery)    x 5   Past Surgical History:  Procedure Laterality Date  . COLONOSCOPY    . DILATATION & CURRETTAGE/HYSTEROSCOPY WITH RESECTOCOPE N/A 07/15/2014   Procedure: DILATATION & CURETTAGE/HYSTEROSCOPY WITH RESECTOCOPE;  Surgeon: Marvene Staff, MD;  Location: [facility];  Service: Gynecology;  Laterality: N/A;  . DILATION AND CURETTAGE OF UTERUS     polyps  . LEFT HEART CATH AND CORONARY ANGIOGRAPHY N/A 04/16/2018   Procedure: LEFT HEART CATH AND CORONARY ANGIOGRAPHY;  Surgeon: Belva Crome, MD;  Location: [facility];  Service: Cardiovascular;  Laterality: N/A;  . right thigh surgery     cancer  . WISDOM TOOTH EXTRACTION       No outpatient medications have been marked as taking for the 07/16/19 encounter (Appointment) with Whitney Raymond, NP.     Allergies:   Codeine and Vicodin [hydrocodone-acetaminophen]   Social History   Tobacco Use  . Smoking status: Never Smoker  . Smokeless tobacco: Never Used  Substance Use Topics  . Alcohol use: Yes    Alcohol/week: 0.0 standard drinks    Comment: social - wine  . Drug use: No     Family Hx: The patient's family history includes CVA in her mother; Cancer in her father; Diabetes in her father and sister; Heart attack in her sister; Hypertension in her  mother.  ROS:   Please see the history of present illness.     All other systems reviewed and are negative.  Prior CV studies:   The following studies were reviewed today:  Echocardiogram 10/29/2018: Left ventricle cavity is normal in size. Normal global wall motion. Normal diastolic filling pattern. Calculated EF 55%. Left atrial cavity is mildly dilated. Aneurysmal interatrial septum. PFO cannot be excluded.  Right atrial cavity is mildly dilated. Mild tricuspid regurgitation.  No evidence of pulmonary hypertension.  LEFT HEART CATH AND CORONARY ANGIOGRAPHY8/2019  Conclusion   Right dominant coronary system. 40 to 50% ostial RCA stenosis.  Normal left main.  Apical 70% LAD after the third diagonal branch. The LAD and diagonals are otherwise normal.  Widely patent circumflex artery without significant obstruction.  Normal left ventricular systolic function. EF greater than 50%. LVEDP.  RECOMMENDATIONS:   Medical therapy for moderate coronary disease. Symptoms could be related to the distal/apical LAD lesion. Consider beta-blocker or long-acting nitrates if recurring episodes of chest pain. Is  also conceivable the chest pain was not cardiac in nature.  Aggressive risk factor modification: LDL less than 70, aspirin 81 mg daily, blood pressure control to 130/80 mmHg or less, hemoglobin A1c less than 70, aerobic activity as tolerated.  Recommend Aspirin 81mg  daily for moderate CAD.    Labs/Other Tests and Data Reviewed:    EKG:  No ECG reviewed.  Recent Labs: No results found for requested labs within last 8760 hours.   Recent Lipid Panel Lab Results  Component Value Date/Time   CHOL 206 (H) 04/16/2018 06:05 AM   TRIG 81 04/16/2018 06:05 AM   HDL 46 04/16/2018 06:05 AM   CHOLHDL 4.5 04/16/2018 06:05 AM   LDLCALC 144 (H) 04/16/2018 06:05 AM    Wt Readings from Last 3 Encounters:  07/04/19 210 lb 6.4 oz (95.4 kg)  06/30/19 205 lb 6.4 oz (93.2 kg)   12/19/18 202 lb 8 oz (91.9 kg)     Objective:    Vital Signs:  There were no vitals taken for this visit.   VITAL SIGNS:  reviewed GEN:  no acute distress NEURO:  alert and oriented x 3, no obvious focal deficit PSYCH:  normal affect  ASSESSMENT & PLAN:    1. Chest pain with history of CAD: -Recently underwent cardiac catheterization which showed distal/apical LAD lesion however medical management was recommended. -Thoughts were to consider adding beta-blocker long-acting nitrates to her regimen if recurrent symptoms -Seen by myself 07/04/2019 in which Imdur 15 mg p.o. daily was added to her regimen with symptom improvement which included DOE and exertional chest pain>>> denies recurrence -Previous EKG with no changes from prior tracings dating back to 04/2018  2.  PAF: -Previously noted to be on a calcium channel blockers and sotalol however was noted to no longer be taking these -On chronic anticoagulation with Eliquis>>reports compliance  -Denies palpitations -No acute bleeding noted  3.  HTN: -No BP available today  -Continue current regimen with the addition of Imdur as above   4.  HLD: -Last LDL noted to be 54 on 05/07/2019  -Continue   5.  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  The importance of social distancing was discussed today.  Time:   Today, I have spent 15 minutes with the patient with telehealth technology discussing the above problems.     Medication Adjustments/Labs and Tests Ordered: Current medicines are reviewed at length with the patient today.  Concerns regarding medicines are outlined above.   Tests Ordered: No orders of the defined types were placed in this encounter.   Medication Changes: No  orders of the defined types were placed in this encounter.   Follow Up:  Either In Person or Virtual Dr. Tamala Julian in 3 months  Signed, Kathyrn Drown, NP  07/09/2019 12:33 PM    [place]

## 2019-07-10 DIAGNOSIS — Z96642 Presence of left artificial hip joint: Secondary | ICD-10-CM | POA: Diagnosis not present

## 2019-07-10 DIAGNOSIS — C4921 Malignant neoplasm of connective and soft tissue of right lower limb, including hip: Secondary | ICD-10-CM | POA: Diagnosis not present

## 2019-07-10 DIAGNOSIS — C499 Malignant neoplasm of connective and soft tissue, unspecified: Secondary | ICD-10-CM | POA: Diagnosis not present

## 2019-07-16 ENCOUNTER — Telehealth (INDEPENDENT_AMBULATORY_CARE_PROVIDER_SITE_OTHER): Payer: BC Managed Care – PPO | Admitting: Cardiology

## 2019-07-16 ENCOUNTER — Other Ambulatory Visit: Payer: Self-pay

## 2019-07-16 VITALS — Ht 68.0 in | Wt 206.0 lb

## 2019-07-16 DIAGNOSIS — I493 Ventricular premature depolarization: Secondary | ICD-10-CM | POA: Diagnosis not present

## 2019-07-16 DIAGNOSIS — Z7901 Long term (current) use of anticoagulants: Secondary | ICD-10-CM | POA: Diagnosis not present

## 2019-07-16 DIAGNOSIS — E7841 Elevated Lipoprotein(a): Secondary | ICD-10-CM

## 2019-07-16 DIAGNOSIS — I480 Paroxysmal atrial fibrillation: Secondary | ICD-10-CM | POA: Diagnosis not present

## 2019-07-16 DIAGNOSIS — I25118 Atherosclerotic heart disease of native coronary artery with other forms of angina pectoris: Secondary | ICD-10-CM

## 2019-07-16 DIAGNOSIS — I10 Essential (primary) hypertension: Secondary | ICD-10-CM

## 2019-07-16 NOTE — Patient Instructions (Signed)
Medication Instructions:   Your physician recommends that you continue on your current medications as directed. Please refer to the Current Medication list given to you today.  *If you need a refill on your cardiac medications before your next appointment, please call your pharmacy*  Lab Work:  None ordered today  Testing/Procedures:  None ordered today  Follow-Up: At [facility], you and your health needs are our priority.  As part of our continuing mission to provide you with exceptional heart care, we have created designated Provider Care Teams.  These Care Teams include your primary Cardiologist (physician) and Advanced Practice Providers (APPs -  Physician Assistants and Nurse Practitioners) who all work together to provide you with the care you need, when you need it.  Your next appointment:   10/24/2019 at [place] with Daneen Schick, MD  The format for your next appointment:   In Person

## 2019-08-21 ENCOUNTER — Other Ambulatory Visit: Payer: Self-pay | Admitting: Cardiology

## 2019-08-21 DIAGNOSIS — Z79891 Long term (current) use of opiate analgesic: Secondary | ICD-10-CM | POA: Diagnosis not present

## 2019-08-21 DIAGNOSIS — Z79899 Other long term (current) drug therapy: Secondary | ICD-10-CM | POA: Diagnosis not present

## 2019-08-21 DIAGNOSIS — I25118 Atherosclerotic heart disease of native coronary artery with other forms of angina pectoris: Secondary | ICD-10-CM

## 2019-08-21 DIAGNOSIS — M170 Bilateral primary osteoarthritis of knee: Secondary | ICD-10-CM | POA: Diagnosis not present

## 2019-08-21 DIAGNOSIS — Z1231 Encounter for screening mammogram for malignant neoplasm of breast: Secondary | ICD-10-CM | POA: Diagnosis not present

## 2019-08-21 DIAGNOSIS — G894 Chronic pain syndrome: Secondary | ICD-10-CM | POA: Diagnosis not present

## 2019-09-10 DIAGNOSIS — M170 Bilateral primary osteoarthritis of knee: Secondary | ICD-10-CM | POA: Diagnosis not present

## 2019-09-19 ENCOUNTER — Telehealth: Payer: Self-pay | Admitting: Interventional Cardiology

## 2019-09-19 NOTE — Telephone Encounter (Signed)
New Message    Pt is wanting Dr Darliss Ridgel nurse to call her, she is upset about rescheduling her FEB appt and Dr Tamala Julian has nothing available until the End of March at this time.  She does not want to see a PA and is wondering if she can be worked in somewhere    Please call back

## 2019-09-19 NOTE — Telephone Encounter (Signed)
Spoke with pt and scheduled her to see Dr. Tamala Julian 2/23.  Pt appreciative for call.

## 2019-09-24 DIAGNOSIS — M47817 Spondylosis without myelopathy or radiculopathy, lumbosacral region: Secondary | ICD-10-CM | POA: Diagnosis not present

## 2019-10-01 DIAGNOSIS — M170 Bilateral primary osteoarthritis of knee: Secondary | ICD-10-CM | POA: Diagnosis not present

## 2019-10-15 DIAGNOSIS — M47816 Spondylosis without myelopathy or radiculopathy, lumbar region: Secondary | ICD-10-CM | POA: Diagnosis not present

## 2019-10-15 DIAGNOSIS — Z79899 Other long term (current) drug therapy: Secondary | ICD-10-CM | POA: Diagnosis not present

## 2019-10-15 DIAGNOSIS — M170 Bilateral primary osteoarthritis of knee: Secondary | ICD-10-CM | POA: Diagnosis not present

## 2019-10-15 DIAGNOSIS — G894 Chronic pain syndrome: Secondary | ICD-10-CM | POA: Diagnosis not present

## 2019-10-19 ENCOUNTER — Other Ambulatory Visit: Payer: Self-pay | Admitting: Interventional Cardiology

## 2019-10-20 NOTE — Telephone Encounter (Signed)
Eliquis 5mg  refill request received, pt is 65 yrs old, weight-93.4kg, Crea-0.84 on 05/07/2019 via [facility] from [place], [place], and last seen by Kathyrn Drown on 07/16/2019. Dose is appropriate based on dosing criteria. Will send in refill to requested pharmacy.

## 2019-10-24 ENCOUNTER — Ambulatory Visit: Payer: BC Managed Care – PPO | Admitting: Interventional Cardiology

## 2019-10-27 NOTE — Progress Notes (Signed)
Cardiology Office Note:    Date:  10/28/2019   ID:  Chelsey, Whitney Daniel 13-Apr-1956, MRN [id number]  PCP:  London Pepper, MD  Cardiologist:  Sinclair Grooms, MD   Referring MD: London Pepper, MD   Chief Complaint  Patient presents with  . Coronary Artery Disease  . Atrial Fibrillation  . Advice Only    Atrial fibrillation    History of Present Illness:    Whitney Daniel is a 64 y.o. female with a hx of PAF, moderate CAD, hypertension, chronic anticoagulation, and fibromyxoid fibroma.  Mrs. Whitney Daniel is doing well.  She has not had angina.  She denies shortness of breath.  Occasional palpitations (possibly atrial fib) have occurred and have been short.  No transient neurological symptoms.  No bleeding on anticoagulation.  Past Medical History:  Diagnosis Date  . A-fib ([place])   . Anemia   . Cancer ([place])   . Fibromyalgia   . Hypertension   . Hypothyroidism   . Osteoarthritis   . SVD (spontaneous vaginal delivery)    x 5    Past Surgical History:  Procedure Laterality Date  . COLONOSCOPY    . DILATATION & CURRETTAGE/HYSTEROSCOPY WITH RESECTOCOPE N/A 07/15/2014   Procedure: DILATATION & CURETTAGE/HYSTEROSCOPY WITH RESECTOCOPE;  Surgeon: Marvene Staff, MD;  Location: [facility];  Service: Gynecology;  Laterality: N/A;  . DILATION AND CURETTAGE OF UTERUS     polyps  . LEFT HEART CATH AND CORONARY ANGIOGRAPHY N/A 04/16/2018   Procedure: LEFT HEART CATH AND CORONARY ANGIOGRAPHY;  Surgeon: Belva Crome, MD;  Location: [facility];  Service: Cardiovascular;  Laterality: N/A;  . right thigh surgery     cancer  . WISDOM TOOTH EXTRACTION      Current Medications: Current Meds  Medication Sig  . amLODipine (NORVASC) 5 MG tablet TAKE 1 TABLET BY MOUTH EVERY DAY  . Ascorbic Acid (VITAMIN C) 500 MG CHEW Chew 500 mg by mouth daily.  Marland Kitchen atorvastatin (LIPITOR) 80 MG tablet TAKE 1 TABLET BY MOUTH EVERY DAY  . B Complex-Biotin-FA (B-50 COMPLEX PO) 50 mg daily.  . Biotin  10000 MCG TABS Take 10,000 mcg by mouth daily.  . budesonide-formoterol (SYMBICORT) 160-4.5 MCG/ACT inhaler Inhale 2 puffs into the lungs daily as needed (shortness of breath or wheezing).   . Cholecalciferol ([employer] D3) 50 MCG (2000 UT) CAPS Take 50 mcg by mouth daily.  Marland Kitchen ELIQUIS 5 MG TABS tablet TAKE 1 TABLET BY MOUTH TWICE A DAY  . isosorbide mononitrate (IMDUR) 30 MG 24 hr tablet Take 0.5 tablets (15 mg total) by mouth daily.  Marland Kitchen levothyroxine (SYNTHROID, LEVOTHROID) 75 MCG tablet Take 75 mcg by mouth daily before breakfast.   . losartan-hydrochlorothiazide (HYZAAR) 50-12.5 MG tablet Take 1 tablet by mouth daily.  . Multiple Vitamin (MULTIVITAMIN WITH MINERALS) TABS Take 1 tablet by mouth daily.  . nitroGLYCERIN (NITROSTAT) 0.4 MG SL tablet TAKE 1 TABLET BY MOUTH EVERY 5 MINUTES AS NEEDED FOR CHEST PAIN  . pregabalin (LYRICA) 75 MG capsule Take 75 mg by mouth 3 (three) times daily.  . traMADol (ULTRAM) 50 MG tablet Take 1 tablet (50 mg total) by mouth 2 (two) times daily as needed for moderate pain.  . vitamin E 400 UNIT capsule Take 400 Units by mouth daily.     Allergies:   Codeine and Vicodin [hydrocodone-acetaminophen]   Social History   Socioeconomic History  . Marital status: Married    Spouse name: Not on file  .  Number of children: Not on file  . Years of education: Not on file  . Highest education level: Not on file  Occupational History  . Not on file  Tobacco Use  . Smoking status: Never Smoker  . Smokeless tobacco: Never Used  Substance and Sexual Activity  . Alcohol use: Yes    Alcohol/week: 0.0 standard drinks    Comment: social - wine  . Drug use: No  . Sexual activity: Yes    Birth control/protection: Post-menopausal  Other Topics Concern  . Not on file  Social History Narrative   Lives alone in a one story home.  Has 5 children.  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    Social Determinants of Health   Financial Resource Strain:   . Difficulty of Paying Living Expenses: Not on file  Food Insecurity:   . Worried About Charity fundraiser in the Last Year: Not on file  . Ran Out of Food in the Last Year: Not on file  Transportation Needs:   . Lack of Transportation (Medical): Not on file  . Lack of Transportation (Non-Medical): Not on file  Physical Activity:   . Days of Exercise per Week: Not on file  . Minutes of Exercise per Session: Not on file  Stress:   . Feeling of Stress : Not on file  Social Connections:   . Frequency of Communication with Friends and Family: Not on file  . Frequency of Social Gatherings with Friends and Family: Not on file  . Attends Religious Services: Not on file  . Active Member of Clubs or Organizations: Not on file  . Attends Archivist Meetings: Not on file  . Marital Status: Not on file     Family History: The patient's family history includes CVA in her mother; Cancer in her father; Diabetes in her father and sister; Heart attack in her sister; Hypertension in her mother.  ROS:   Please see the history of present illness.    Reluctant to take the COVID-19 vaccine.  She has had neck pain intermittently and wonders if this is related to a clogged artery.  All other systems reviewed and are negative.  EKGs/Labs/Other Studies Reviewed:    The following studies were reviewed today: No new imaging data  EKG:  EKG no new data  Recent Labs: No results found for requested labs within last 8760 hours.  Recent Lipid Panel    Component Value Date/Time   CHOL 206 (H) 04/16/2018 0605   TRIG 81 04/16/2018 0605   HDL 46 04/16/2018 0605   CHOLHDL 4.5 04/16/2018 0605   VLDL 16 04/16/2018 0605   LDLCALC 144 (H) 04/16/2018 0605    Physical Exam:    VS:  BP 118/68   Pulse 84   Ht 5\' 8"  (1.727 m)   Wt 200 lb 12.8 oz (91.1 kg)   SpO2 99%   BMI 30.53 kg/m     Wt Readings from Last 3  Encounters:  10/28/19 200 lb 12.8 oz (91.1 kg)  07/16/19 206 lb (93.4 kg)  07/04/19 210 lb 6.4 oz (95.4 kg)     GEN: Mild obesity.  No acute distress HEENT: Normal NECK: Soft left carotid bruit versus hmmm. LYMPHATICS: No lymphadenopathy CARDIAC:  RRR without murmur, gallop, or edema. VASCULAR:  Normal Pulses. No bruits. RESPIRATORY:  Clear to auscultation without rales, wheezing or rhonchi  ABDOMEN:  Soft, non-tender, non-distended, No pulsatile mass, MUSCULOSKELETAL: No deformity  SKIN: Warm and dry NEUROLOGIC:  Alert and oriented x 3 PSYCHIATRIC:  Normal affect   ASSESSMENT:    1. Coronary artery disease of native artery of native heart with stable angina pectoris ([place])   2. Essential hypertension   3. Paroxysmal atrial fibrillation ([facility])   4. Chronic anticoagulation   5. 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 Elevated lipoprotein(a)   7. Bruit   8. Educated about COVID-19 virus infection    PLAN:    In order of problems listed above:  1. Secondary prevention discussed 2. Blood pressure control is excellent 3. She is happy with current control of atrial fibrillation.  She is protected with Eliquis against thromboembolic events.  No change in therapy. 4. No bleeding on Eliquis. 5. Not discussed 6. Not discussed 7. There is a left carotid sound, possibly a bruit versus venous hum.  Given atherosclerosis and heart, bilateral carotid Doppler study will be done. 8. She is encouraged to take the COVID-19 vaccine.  She is reluctant.  She is encouraged by our conversation.  3W's is being practiced.  Overall education and awareness concerning primary/secondary risk prevention was discussed in detail: LDL less than 70, hemoglobin A1c less than 7, blood pressure target less than 130/80 mmHg, >150 minutes of moderate aerobic activity per week, avoidance of smoking, weight control (via diet and exercise), and continued surveillance/management of/for obstructive sleep  apnea.    Medication Adjustments/Labs and Tests Ordered: Current medicines are reviewed at length with the patient today.  Concerns regarding medicines are outlined above.  Orders Placed This Encounter  Procedures  . VAS US CAROTID   No orders of the defined types were placed in this encounter.   Patient Instructions  Medication Instructions:  Your physician recommends that you continue on your current medications as directed. Please refer to the Current Medication list given to you today.  *If you need a refill on your cardiac medications before your next appointment, please call your pharmacy*  Lab Work: None If you have labs (blood work) drawn today and your tests are completely normal, you will receive your results only by: Marland Kitchen MyChart Message (if you have MyChart) OR . A paper copy in the mail If you have any lab test that is abnormal or we need to change your treatment, we will call you to review the results.  Testing/Procedures: Your physician has requested that you have a carotid duplex. This test is an ultrasound of the carotid arteries in your neck. It looks at blood flow through these arteries that supply the brain with blood. Allow one hour for this exam. There are no restrictions or special instructions.   Follow-Up: At [facility], you and your health needs are our priority.  As part of our continuing mission to provide you with exceptional heart care, we have created designated Provider Care Teams.  These Care Teams include your primary Cardiologist (physician) and Advanced Practice Providers (APPs -  Physician Assistants and Nurse Practitioners) who all work together to provide you with the care you need, when you need it.  Your next appointment:   9-10 month(s)  The format for your next appointment:   In Person  Provider:   You may see Sinclair Grooms, MD or one of the following Advanced Practice Providers on your designated Care Team:    Truitt Merle,  NP  Cecilie Kicks, NP  Kathyrn Drown, NP   Other Instructions  Signed, Sinclair Grooms, MD  10/28/2019 5:52 PM    [place]

## 2019-10-28 ENCOUNTER — Other Ambulatory Visit: Payer: Self-pay

## 2019-10-28 ENCOUNTER — Encounter: Payer: Self-pay | Admitting: Interventional Cardiology

## 2019-10-28 ENCOUNTER — Ambulatory Visit (INDEPENDENT_AMBULATORY_CARE_PROVIDER_SITE_OTHER): Payer: BC Managed Care – PPO | Admitting: Interventional Cardiology

## 2019-10-28 VITALS — BP 118/68 | HR 84 | Ht 68.0 in | Wt 200.8 lb

## 2019-10-28 DIAGNOSIS — Z7189 Other specified counseling: Secondary | ICD-10-CM

## 2019-10-28 DIAGNOSIS — E7841 Elevated Lipoprotein(a): Secondary | ICD-10-CM

## 2019-10-28 DIAGNOSIS — Z7901 Long term (current) use of anticoagulants: Secondary | ICD-10-CM | POA: Diagnosis not present

## 2019-10-28 DIAGNOSIS — R0989 Other specified symptoms and signs involving the circulatory and respiratory systems: Secondary | ICD-10-CM | POA: Diagnosis not present

## 2019-10-28 DIAGNOSIS — C499 Malignant neoplasm of connective and soft tissue, unspecified: Secondary | ICD-10-CM

## 2019-10-28 DIAGNOSIS — I10 Essential (primary) hypertension: Secondary | ICD-10-CM | POA: Diagnosis not present

## 2019-10-28 DIAGNOSIS — I480 Paroxysmal atrial fibrillation: Secondary | ICD-10-CM | POA: Diagnosis not present

## 2019-10-28 DIAGNOSIS — I25118 Atherosclerotic heart disease of native coronary artery with other forms of angina pectoris: Secondary | ICD-10-CM | POA: Diagnosis not present

## 2019-10-28 NOTE — Patient Instructions (Signed)
Medication Instructions:  Your physician recommends that you continue on your current medications as directed. Please refer to the Current Medication list given to you today.  *If you need a refill on your cardiac medications before your next appointment, please call your pharmacy*  Lab Work: None If you have labs (blood work) drawn today and your tests are completely normal, you will receive your results only by: Marland Kitchen MyChart Message (if you have MyChart) OR . A paper copy in the mail If you have any lab test that is abnormal or we need to change your treatment, we will call you to review the results.  Testing/Procedures: Your physician has requested that you have a carotid duplex. This test is an ultrasound of the carotid arteries in your neck. It looks at blood flow through these arteries that supply the brain with blood. Allow one hour for this exam. There are no restrictions or special instructions.   Follow-Up: At [facility], you and your health needs are our priority.  As part of our continuing mission to provide you with exceptional heart care, we have created designated Provider Care Teams.  These Care Teams include your primary Cardiologist (physician) and Advanced Practice Providers (APPs -  Physician Assistants and Nurse Practitioners) who all work together to provide you with the care you need, when you need it.  Your next appointment:   9-10 month(s)  The format for your next appointment:   In Person  Provider:   You may see Sinclair Grooms, MD or one of the following Advanced Practice Providers on your designated Care Team:    Truitt Merle, NP  Cecilie Kicks, NP  Kathyrn Drown, NP   Other Instructions

## 2019-10-29 DIAGNOSIS — M5136 Other intervertebral disc degeneration, lumbar region: Secondary | ICD-10-CM | POA: Diagnosis not present

## 2019-10-29 DIAGNOSIS — M47817 Spondylosis without myelopathy or radiculopathy, lumbosacral region: Secondary | ICD-10-CM | POA: Diagnosis not present

## 2019-11-07 DIAGNOSIS — Z0000 Encounter for general adult medical examination without abnormal findings: Secondary | ICD-10-CM | POA: Diagnosis not present

## 2019-11-17 ENCOUNTER — Other Ambulatory Visit: Payer: Self-pay

## 2019-11-17 ENCOUNTER — Ambulatory Visit (HOSPITAL_COMMUNITY)
Admission: RE | Admit: 2019-11-17 | Discharge: 2019-11-17 | Disposition: A | Discharge status: Home / Self Care | Payer: BC Managed Care – PPO | Source: Ambulatory Visit | Attending: Cardiovascular Disease | Admitting: Cardiovascular Disease

## 2019-11-17 DIAGNOSIS — R0989 Other specified symptoms and signs involving the circulatory and respiratory systems: Secondary | ICD-10-CM

## 2019-11-19 DIAGNOSIS — M47816 Spondylosis without myelopathy or radiculopathy, lumbar region: Secondary | ICD-10-CM | POA: Diagnosis not present

## 2019-11-19 DIAGNOSIS — M170 Bilateral primary osteoarthritis of knee: Secondary | ICD-10-CM | POA: Diagnosis not present

## 2019-11-19 DIAGNOSIS — Z79891 Long term (current) use of opiate analgesic: Secondary | ICD-10-CM | POA: Diagnosis not present

## 2019-11-19 DIAGNOSIS — G894 Chronic pain syndrome: Secondary | ICD-10-CM | POA: Diagnosis not present

## 2019-11-19 DIAGNOSIS — Z79899 Other long term (current) drug therapy: Secondary | ICD-10-CM | POA: Diagnosis not present

## 2019-11-19 DIAGNOSIS — M545 Low back pain: Secondary | ICD-10-CM | POA: Diagnosis not present

## 2019-11-24 DIAGNOSIS — Z79899 Other long term (current) drug therapy: Secondary | ICD-10-CM | POA: Diagnosis not present

## 2019-11-24 DIAGNOSIS — E039 Hypothyroidism, unspecified: Secondary | ICD-10-CM | POA: Diagnosis not present

## 2019-11-24 DIAGNOSIS — I480 Paroxysmal atrial fibrillation: Secondary | ICD-10-CM | POA: Diagnosis not present

## 2019-11-24 DIAGNOSIS — E7800 Pure hypercholesterolemia, unspecified: Secondary | ICD-10-CM | POA: Diagnosis not present

## 2019-11-24 DIAGNOSIS — Z0000 Encounter for general adult medical examination without abnormal findings: Secondary | ICD-10-CM | POA: Diagnosis not present

## 2019-11-24 DIAGNOSIS — R7303 Prediabetes: Secondary | ICD-10-CM | POA: Diagnosis not present

## 2019-11-24 DIAGNOSIS — R5383 Other fatigue: Secondary | ICD-10-CM | POA: Diagnosis not present

## 2019-11-28 ENCOUNTER — Other Ambulatory Visit: Payer: Self-pay | Admitting: Cardiology

## 2019-11-28 DIAGNOSIS — I25118 Atherosclerotic heart disease of native coronary artery with other forms of angina pectoris: Secondary | ICD-10-CM

## 2019-12-17 DIAGNOSIS — M47816 Spondylosis without myelopathy or radiculopathy, lumbar region: Secondary | ICD-10-CM | POA: Diagnosis not present

## 2019-12-17 DIAGNOSIS — M170 Bilateral primary osteoarthritis of knee: Secondary | ICD-10-CM | POA: Diagnosis not present

## 2019-12-17 DIAGNOSIS — G894 Chronic pain syndrome: Secondary | ICD-10-CM | POA: Diagnosis not present

## 2019-12-17 DIAGNOSIS — M545 Low back pain: Secondary | ICD-10-CM | POA: Diagnosis not present

## 2020-01-14 DIAGNOSIS — M545 Low back pain: Secondary | ICD-10-CM | POA: Diagnosis not present

## 2020-01-14 DIAGNOSIS — M170 Bilateral primary osteoarthritis of knee: Secondary | ICD-10-CM | POA: Diagnosis not present

## 2020-01-14 DIAGNOSIS — M47816 Spondylosis without myelopathy or radiculopathy, lumbar region: Secondary | ICD-10-CM | POA: Diagnosis not present

## 2020-01-14 DIAGNOSIS — G894 Chronic pain syndrome: Secondary | ICD-10-CM | POA: Diagnosis not present

## 2020-01-21 DIAGNOSIS — M5136 Other intervertebral disc degeneration, lumbar region: Secondary | ICD-10-CM | POA: Diagnosis not present

## 2020-01-21 DIAGNOSIS — M47817 Spondylosis without myelopathy or radiculopathy, lumbosacral region: Secondary | ICD-10-CM | POA: Diagnosis not present

## 2020-01-22 DIAGNOSIS — K7402 Hepatic fibrosis, advanced fibrosis: Secondary | ICD-10-CM | POA: Diagnosis not present

## 2020-01-23 ENCOUNTER — Other Ambulatory Visit: Payer: Self-pay | Admitting: Nurse Practitioner

## 2020-01-23 DIAGNOSIS — K7402 Hepatic fibrosis, advanced fibrosis: Secondary | ICD-10-CM

## 2020-02-04 DIAGNOSIS — M810 Age-related osteoporosis without current pathological fracture: Secondary | ICD-10-CM | POA: Diagnosis not present

## 2020-02-04 DIAGNOSIS — Z780 Asymptomatic menopausal state: Secondary | ICD-10-CM | POA: Diagnosis not present

## 2020-02-04 DIAGNOSIS — M85851 Other specified disorders of bone density and structure, right thigh: Secondary | ICD-10-CM | POA: Diagnosis not present

## 2020-02-04 DIAGNOSIS — M85852 Other specified disorders of bone density and structure, left thigh: Secondary | ICD-10-CM | POA: Diagnosis not present

## 2020-02-09 ENCOUNTER — Ambulatory Visit
Admission: RE | Admit: 2020-02-09 | Discharge: 2020-02-09 | Disposition: A | Discharge status: Home / Self Care | Payer: BC Managed Care – PPO | Source: Ambulatory Visit | Attending: Nurse Practitioner | Admitting: Nurse Practitioner

## 2020-02-09 DIAGNOSIS — K7402 Hepatic fibrosis, advanced fibrosis: Secondary | ICD-10-CM

## 2020-02-09 DIAGNOSIS — K7689 Other specified diseases of liver: Secondary | ICD-10-CM | POA: Diagnosis not present

## 2020-02-11 DIAGNOSIS — Z79891 Long term (current) use of opiate analgesic: Secondary | ICD-10-CM | POA: Diagnosis not present

## 2020-02-11 DIAGNOSIS — G894 Chronic pain syndrome: Secondary | ICD-10-CM | POA: Diagnosis not present

## 2020-02-11 DIAGNOSIS — M170 Bilateral primary osteoarthritis of knee: Secondary | ICD-10-CM | POA: Diagnosis not present

## 2020-02-11 DIAGNOSIS — Z79899 Other long term (current) drug therapy: Secondary | ICD-10-CM | POA: Diagnosis not present

## 2020-02-11 DIAGNOSIS — M47816 Spondylosis without myelopathy or radiculopathy, lumbar region: Secondary | ICD-10-CM | POA: Diagnosis not present

## 2020-02-11 DIAGNOSIS — M545 Low back pain: Secondary | ICD-10-CM | POA: Diagnosis not present

## 2020-02-21 ENCOUNTER — Other Ambulatory Visit: Payer: Self-pay | Admitting: Interventional Cardiology

## 2020-02-23 ENCOUNTER — Other Ambulatory Visit: Payer: Self-pay | Admitting: Pain Medicine

## 2020-02-23 DIAGNOSIS — M79652 Pain in left thigh: Secondary | ICD-10-CM

## 2020-02-23 DIAGNOSIS — M545 Low back pain, unspecified: Secondary | ICD-10-CM

## 2020-02-23 DIAGNOSIS — M7918 Myalgia, other site: Secondary | ICD-10-CM

## 2020-02-26 DIAGNOSIS — Z124 Encounter for screening for malignant neoplasm of cervix: Secondary | ICD-10-CM | POA: Diagnosis not present

## 2020-02-26 DIAGNOSIS — Z01419 Encounter for gynecological examination (general) (routine) without abnormal findings: Secondary | ICD-10-CM | POA: Diagnosis not present

## 2020-02-26 DIAGNOSIS — Z1151 Encounter for screening for human papillomavirus (HPV): Secondary | ICD-10-CM | POA: Diagnosis not present

## 2020-02-26 DIAGNOSIS — R69 Illness, unspecified: Secondary | ICD-10-CM | POA: Diagnosis not present

## 2020-03-02 DIAGNOSIS — M810 Age-related osteoporosis without current pathological fracture: Secondary | ICD-10-CM | POA: Diagnosis not present

## 2020-03-19 ENCOUNTER — Other Ambulatory Visit: Payer: Self-pay | Admitting: Pain Medicine

## 2020-03-22 ENCOUNTER — Other Ambulatory Visit: Payer: BC Managed Care – PPO

## 2020-04-09 ENCOUNTER — Other Ambulatory Visit: Payer: Self-pay

## 2020-04-09 ENCOUNTER — Ambulatory Visit
Admission: RE | Admit: 2020-04-09 | Discharge: 2020-04-09 | Disposition: A | Discharge status: Home / Self Care | Payer: BC Managed Care – PPO | Source: Ambulatory Visit | Attending: Pain Medicine | Admitting: Pain Medicine

## 2020-04-09 DIAGNOSIS — M545 Low back pain, unspecified: Secondary | ICD-10-CM

## 2020-04-09 DIAGNOSIS — M5127 Other intervertebral disc displacement, lumbosacral region: Secondary | ICD-10-CM | POA: Diagnosis not present

## 2020-04-09 DIAGNOSIS — M47817 Spondylosis without myelopathy or radiculopathy, lumbosacral region: Secondary | ICD-10-CM | POA: Diagnosis not present

## 2020-04-09 DIAGNOSIS — M7918 Myalgia, other site: Secondary | ICD-10-CM

## 2020-04-09 DIAGNOSIS — M4319 Spondylolisthesis, multiple sites in spine: Secondary | ICD-10-CM | POA: Diagnosis not present

## 2020-04-09 DIAGNOSIS — M79652 Pain in left thigh: Secondary | ICD-10-CM

## 2020-04-09 DIAGNOSIS — M5126 Other intervertebral disc displacement, lumbar region: Secondary | ICD-10-CM | POA: Diagnosis not present

## 2020-05-20 DIAGNOSIS — I480 Paroxysmal atrial fibrillation: Secondary | ICD-10-CM | POA: Diagnosis not present

## 2020-05-20 DIAGNOSIS — E039 Hypothyroidism, unspecified: Secondary | ICD-10-CM | POA: Diagnosis not present

## 2020-05-20 DIAGNOSIS — E7800 Pure hypercholesterolemia, unspecified: Secondary | ICD-10-CM | POA: Diagnosis not present

## 2020-05-20 DIAGNOSIS — Z23 Encounter for immunization: Secondary | ICD-10-CM | POA: Diagnosis not present

## 2020-05-20 DIAGNOSIS — R7303 Prediabetes: Secondary | ICD-10-CM | POA: Diagnosis not present

## 2020-05-20 DIAGNOSIS — I10 Essential (primary) hypertension: Secondary | ICD-10-CM | POA: Diagnosis not present

## 2020-05-25 DIAGNOSIS — G894 Chronic pain syndrome: Secondary | ICD-10-CM | POA: Diagnosis not present

## 2020-05-25 DIAGNOSIS — M170 Bilateral primary osteoarthritis of knee: Secondary | ICD-10-CM | POA: Diagnosis not present

## 2020-05-25 DIAGNOSIS — Z79891 Long term (current) use of opiate analgesic: Secondary | ICD-10-CM | POA: Diagnosis not present

## 2020-05-25 DIAGNOSIS — Z79899 Other long term (current) drug therapy: Secondary | ICD-10-CM | POA: Diagnosis not present

## 2020-05-25 DIAGNOSIS — M47816 Spondylosis without myelopathy or radiculopathy, lumbar region: Secondary | ICD-10-CM | POA: Diagnosis not present

## 2020-05-25 DIAGNOSIS — M25559 Pain in unspecified hip: Secondary | ICD-10-CM | POA: Diagnosis not present

## 2020-05-27 ENCOUNTER — Other Ambulatory Visit: Payer: Self-pay | Admitting: Cardiology

## 2020-06-13 ENCOUNTER — Other Ambulatory Visit: Payer: Self-pay | Admitting: Cardiology

## 2020-06-14 DIAGNOSIS — J45909 Unspecified asthma, uncomplicated: Secondary | ICD-10-CM | POA: Diagnosis not present

## 2020-06-14 DIAGNOSIS — I2510 Atherosclerotic heart disease of native coronary artery without angina pectoris: Secondary | ICD-10-CM | POA: Diagnosis not present

## 2020-06-14 DIAGNOSIS — I10 Essential (primary) hypertension: Secondary | ICD-10-CM | POA: Diagnosis not present

## 2020-06-14 DIAGNOSIS — U071 COVID-19: Secondary | ICD-10-CM | POA: Diagnosis not present

## 2020-06-15 ENCOUNTER — Telehealth (HOSPITAL_COMMUNITY): Payer: Self-pay | Admitting: Emergency Medicine

## 2020-06-15 ENCOUNTER — Encounter: Payer: Self-pay | Admitting: Physician Assistant

## 2020-06-15 ENCOUNTER — Other Ambulatory Visit (HOSPITAL_COMMUNITY): Payer: Self-pay | Admitting: Physician Assistant

## 2020-06-15 NOTE — Progress Notes (Signed)
I connected by phone with Whitney Daniel on 06/15/2020 at 5:18 PM to discuss the potential use of a new treatment for mild to moderate COVID-19 viral infection in non-hospitalized patients.  This patient is a 64 y.o. female that meets the FDA criteria for Emergency Use Authorization of COVID monoclonal antibody casirivimab/imdevimab or bamlanivimab/eteseviamb.  Has a (+) direct SARS-CoV-2 viral test result  Has mild or moderate COVID-19   Is NOT hospitalized due to COVID-19  Is within 10 days of symptom onset  Has at least one of the high risk factor(s) for progression to severe COVID-19 and/or hospitalization as defined in EUA.  Specific high risk criteria : Diabetes and Cardiovascular disease or hypertension   I have spoken and communicated the following to the patient or parent/caregiver regarding COVID monoclonal antibody treatment:  1. FDA has authorized the emergency use for the treatment of mild to moderate COVID-19 in adults and pediatric patients with positive results of direct SARS-CoV-2 viral testing who are 79 years of age and older weighing at least 40 kg, and who are at high risk for progressing to severe COVID-19 and/or hospitalization.  2. The significant known and potential risks and benefits of COVID monoclonal antibody, and the extent to which such potential risks and benefits are unknown.  3. Information on available alternative treatments and the risks and benefits of those alternatives, including clinical trials.  4. Patients treated with COVID monoclonal antibody should continue to self-isolate and use infection control measures (e.g., wear mask, isolate, social distance, avoid sharing personal items, clean and disinfect "high touch" surfaces, and frequent handwashing) according to CDC guidelines.   5. The patient or parent/caregiver has the option to accept or refuse COVID monoclonal antibody treatment.  After reviewing this information with the patient, the  patient has agreed to receive one of the available covid 19 monoclonal antibodies and will be provided an appropriate fact sheet prior to infusion. Konrad Felix, PA-C 06/15/2020 5:18 PM

## 2020-06-15 NOTE — Telephone Encounter (Signed)
Called pt and explained possible monoclonal antibody treatment. Sx started 10/6. Tested positive 10/8 at [facility] and 10/9 home rapid test. Sx include flu-like illness, sore throat, cough, fever, diarrhea, and headaches. Qualifying risk factors HTN and DM 2. No one in home is currently symptomatic. Pt interested in tx. Informed pt an APP will call back to possibly schedule an appointment.

## 2020-06-16 ENCOUNTER — Ambulatory Visit (HOSPITAL_COMMUNITY)
Admission: RE | Admit: 2020-06-16 | Discharge: 2020-06-16 | Disposition: A | Discharge status: Home / Self Care | Payer: BC Managed Care – PPO | Source: Ambulatory Visit | Attending: Pulmonary Disease | Admitting: Pulmonary Disease

## 2020-06-16 DIAGNOSIS — U071 COVID-19: Secondary | ICD-10-CM | POA: Diagnosis not present

## 2020-06-16 DIAGNOSIS — Z23 Encounter for immunization: Secondary | ICD-10-CM | POA: Diagnosis not present

## 2020-06-16 DIAGNOSIS — I10 Essential (primary) hypertension: Secondary | ICD-10-CM | POA: Insufficient documentation

## 2020-06-16 DIAGNOSIS — E119 Type 2 diabetes mellitus without complications: Secondary | ICD-10-CM | POA: Diagnosis not present

## 2020-06-16 MED ORDER — SODIUM CHLORIDE 0.9 % IV SOLN: INTRAVENOUS | Status: DC | PRN

## 2020-06-16 MED ORDER — FAMOTIDINE IN NACL 20-0.9 MG/50ML-% IV SOLN: 20.0000 mg | Freq: Once | INTRAVENOUS | Status: DC | PRN

## 2020-06-16 MED ORDER — EPINEPHRINE 0.3 MG/0.3ML IJ SOAJ: 0.3000 mg | Freq: Once | INTRAMUSCULAR | Status: DC | PRN

## 2020-06-16 MED ORDER — ALBUTEROL SULFATE HFA 108 (90 BASE) MCG/ACT IN AERS: 2.0000 | INHALATION_SPRAY | Freq: Once | RESPIRATORY_TRACT | Status: DC | PRN

## 2020-06-16 MED ORDER — DIPHENHYDRAMINE HCL 50 MG/ML IJ SOLN: 50.0000 mg | Freq: Once | INTRAMUSCULAR | Status: DC | PRN

## 2020-06-16 MED ORDER — METHYLPREDNISOLONE SODIUM SUCC 125 MG IJ SOLR: 125.0000 mg | Freq: Once | INTRAMUSCULAR | Status: DC | PRN

## 2020-06-16 MED ORDER — SODIUM CHLORIDE 0.9 % IV SOLN: Freq: Once | INTRAVENOUS | Status: AC

## 2020-06-16 NOTE — Progress Notes (Signed)
  Diagnosis: COVID-19  Physician: Dr. Joya Gaskins  Procedure: [facility] Med: bamlanivimab\etesevimab infusion - Provided patient with bamlanimivab\etesevimab fact sheet for patients, parents and caregivers prior to infusion.  Complications: No immediate complications noted.  Discharge: Discharged home   Chance 06/16/2020

## 2020-06-16 NOTE — Discharge Instructions (Signed)

## 2020-06-30 DIAGNOSIS — M5136 Other intervertebral disc degeneration, lumbar region: Secondary | ICD-10-CM | POA: Diagnosis not present

## 2020-06-30 DIAGNOSIS — M25552 Pain in left hip: Secondary | ICD-10-CM | POA: Diagnosis not present

## 2020-06-30 DIAGNOSIS — G894 Chronic pain syndrome: Secondary | ICD-10-CM | POA: Diagnosis not present

## 2020-06-30 DIAGNOSIS — M2550 Pain in unspecified joint: Secondary | ICD-10-CM | POA: Diagnosis not present

## 2020-07-01 ENCOUNTER — Other Ambulatory Visit: Payer: Self-pay | Admitting: Pain Medicine

## 2020-07-01 DIAGNOSIS — M79652 Pain in left thigh: Secondary | ICD-10-CM

## 2020-07-01 DIAGNOSIS — M7918 Myalgia, other site: Secondary | ICD-10-CM

## 2020-07-04 IMAGING — CR DG CHEST 2V
2 series · 2 of 2 positions shown · non-contrast
Comparison: 08/03/2018 chest radiograph.

CLINICAL DATA: Nonproductive cough for few months

EXAM:
CHEST - 2 VIEW

[w chest pa]
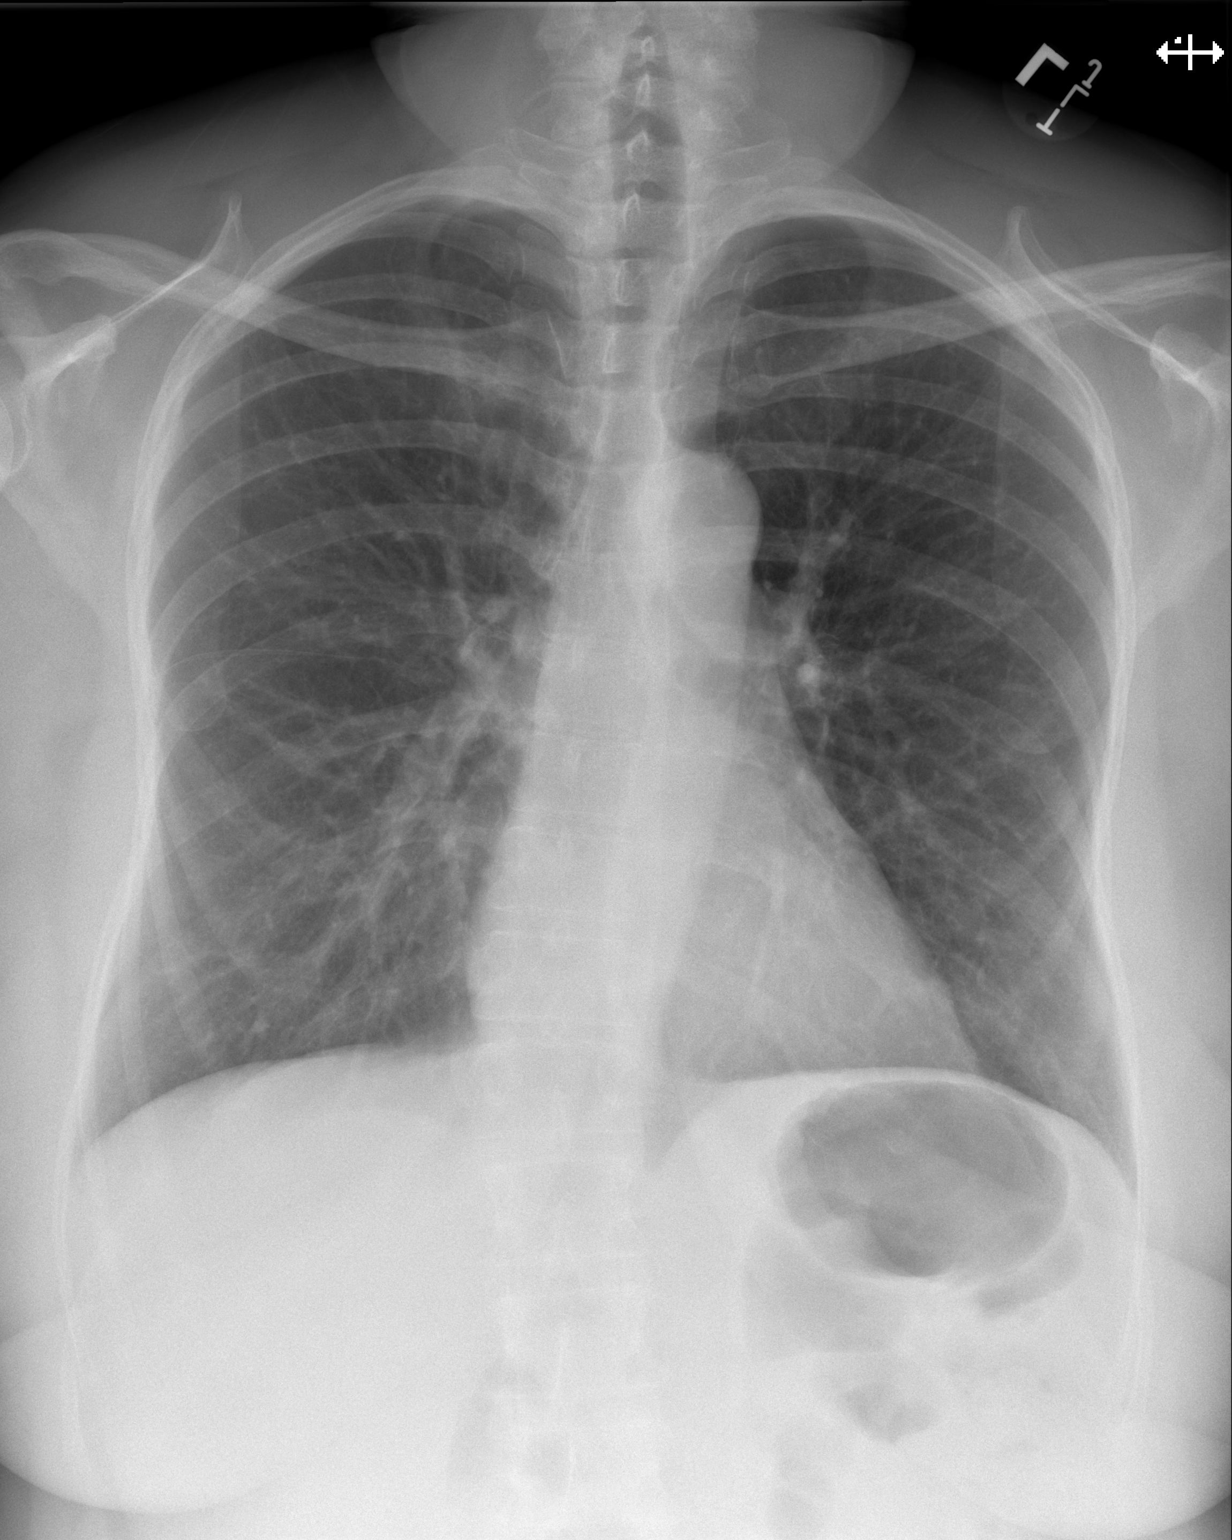

[w chest lat]
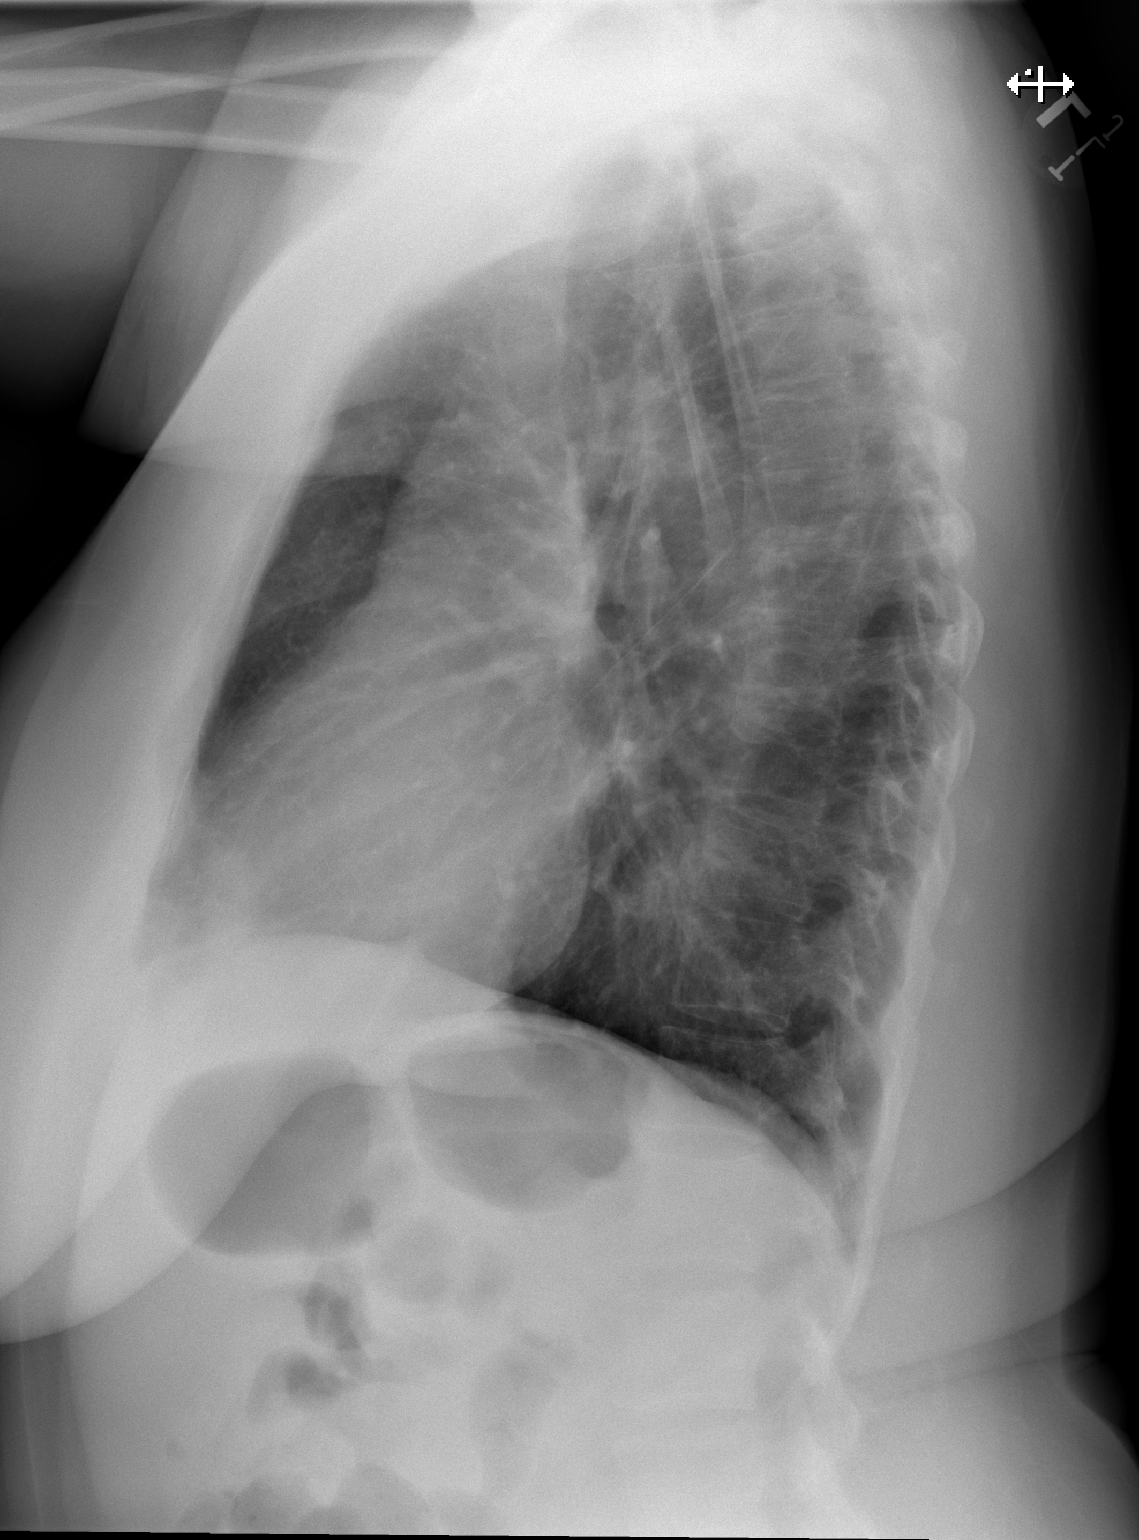

[2 of 2 positions shown; findings below may reference images not displayed]

FINDINGS: Stable cardiomediastinal silhouette with normal heart size. No
pneumothorax. No pleural effusion. Lungs appear clear, with no acute
consolidative airspace disease and no pulmonary edema.
IMPRESSION: No active cardiopulmonary disease.

## 2020-07-14 DIAGNOSIS — F4542 Pain disorder with related psychological factors: Secondary | ICD-10-CM | POA: Diagnosis not present

## 2020-07-14 DIAGNOSIS — G609 Hereditary and idiopathic neuropathy, unspecified: Secondary | ICD-10-CM | POA: Diagnosis not present

## 2020-07-14 DIAGNOSIS — Z79899 Other long term (current) drug therapy: Secondary | ICD-10-CM | POA: Diagnosis not present

## 2020-07-14 DIAGNOSIS — G894 Chronic pain syndrome: Secondary | ICD-10-CM | POA: Diagnosis not present

## 2020-07-14 DIAGNOSIS — Z79891 Long term (current) use of opiate analgesic: Secondary | ICD-10-CM | POA: Diagnosis not present

## 2020-07-14 DIAGNOSIS — M792 Neuralgia and neuritis, unspecified: Secondary | ICD-10-CM | POA: Diagnosis not present

## 2020-07-21 ENCOUNTER — Other Ambulatory Visit: Payer: Self-pay

## 2020-07-21 ENCOUNTER — Ambulatory Visit
Admission: RE | Admit: 2020-07-21 | Discharge: 2020-07-21 | Disposition: A | Discharge status: Home / Self Care | Payer: BC Managed Care – PPO | Source: Ambulatory Visit | Attending: Pain Medicine | Admitting: Pain Medicine

## 2020-07-21 DIAGNOSIS — M7918 Myalgia, other site: Secondary | ICD-10-CM

## 2020-07-21 DIAGNOSIS — M1612 Unilateral primary osteoarthritis, left hip: Secondary | ICD-10-CM | POA: Diagnosis not present

## 2020-07-21 DIAGNOSIS — M79652 Pain in left thigh: Secondary | ICD-10-CM

## 2020-07-21 DIAGNOSIS — R531 Weakness: Secondary | ICD-10-CM | POA: Diagnosis not present

## 2020-07-24 ENCOUNTER — Other Ambulatory Visit: Payer: Self-pay | Admitting: Interventional Cardiology

## 2020-07-26 NOTE — Telephone Encounter (Signed)
rx refill

## 2020-07-28 DIAGNOSIS — M5136 Other intervertebral disc degeneration, lumbar region: Secondary | ICD-10-CM | POA: Diagnosis not present

## 2020-07-28 DIAGNOSIS — G894 Chronic pain syndrome: Secondary | ICD-10-CM | POA: Diagnosis not present

## 2020-07-28 DIAGNOSIS — M24152 Other articular cartilage disorders, left hip: Secondary | ICD-10-CM | POA: Diagnosis not present

## 2020-07-28 DIAGNOSIS — M1612 Unilateral primary osteoarthritis, left hip: Secondary | ICD-10-CM | POA: Diagnosis not present

## 2020-07-31 ENCOUNTER — Other Ambulatory Visit: Payer: Self-pay | Admitting: Cardiology

## 2020-08-10 DIAGNOSIS — R200 Anesthesia of skin: Secondary | ICD-10-CM | POA: Diagnosis not present

## 2020-08-10 DIAGNOSIS — M545 Low back pain, unspecified: Secondary | ICD-10-CM | POA: Diagnosis not present

## 2020-08-10 DIAGNOSIS — M79609 Pain in unspecified limb: Secondary | ICD-10-CM | POA: Diagnosis not present

## 2020-08-16 ENCOUNTER — Other Ambulatory Visit: Payer: Self-pay | Admitting: Interventional Cardiology

## 2020-08-16 NOTE — Telephone Encounter (Signed)
Prescription refill request for Eliquis received.  Indication: Afib  Last office visit: Tamala Julian 10/28/2019 Scr: 0.82, 05/20/2020 Age: 64  Weight: 91.1 kg   Prescription refill sent.

## 2020-08-25 ENCOUNTER — Other Ambulatory Visit: Payer: Self-pay | Admitting: Cardiology

## 2020-08-26 ENCOUNTER — Telehealth: Payer: Self-pay | Admitting: Interventional Cardiology

## 2020-08-26 DIAGNOSIS — E039 Hypothyroidism, unspecified: Secondary | ICD-10-CM | POA: Diagnosis not present

## 2020-08-26 DIAGNOSIS — R413 Other amnesia: Secondary | ICD-10-CM | POA: Diagnosis not present

## 2020-08-26 DIAGNOSIS — E1169 Type 2 diabetes mellitus with other specified complication: Secondary | ICD-10-CM | POA: Diagnosis not present

## 2020-08-26 NOTE — Telephone Encounter (Signed)
Pt. Came in today and is requesting a call back. She needs some info for her job to see if she has any restrictions.

## 2020-09-01 NOTE — Telephone Encounter (Signed)
Spoke with pt and apologized for not calling back as I was out of the office.  Pt states that she was offered a job at the [employer] and they needed a note stating she was ok to work there and would be able to do the job with no restrictions.  Pt states this letter was needed by 12/24 so she has declined the job at this time.  Feels she likely wouldn't have been cleared to do the job anyway.  Reminded pt of appt next week.  Pt appreciative for call.

## 2020-09-04 NOTE — Progress Notes (Signed)
Cardiology Office Note:    Date:  09/08/2020   ID:  Whitney, Daniel 1955-12-05, MRN [id number]  PCP:  Farris Has, MD  Cardiologist:  Lesleigh Noe, MD   Referring MD: Farris Has, MD   Chief Complaint  Patient presents with  . Atrial Fibrillation  . Chest Pain    History of Present Illness:    Whitney Daniel is a 66 y.o. 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  Excessive daytime sleepiness.  Awakens around 3 AM each day.  Feels tired all the time.  Dyspnea on exertion is a concern.  It interferes with her quality of life.  She denies orthopnea and PND.  Palpitations and racing heart similar to A. Fib.  Feels she is having several episodes per month.  States that she feels that she did when she was admitted to the hospital with atrial fib.  Episodes are becoming more frequent.  Chest pain at rest and with exertion.  Episodes can last up to an hour.  These episodes have nonanginal quality.  Past Medical History:  Diagnosis Date  . A-fib (HCC)   . Anemia   . Cancer (HCC)   . Fibromyalgia   . Hypertension   . Hypothyroidism   . Osteoarthritis   . SVD (spontaneous vaginal delivery)    x 5    Past Surgical History:  Procedure Laterality Date  . COLONOSCOPY    . DILATATION & CURRETTAGE/HYSTEROSCOPY WITH RESECTOCOPE N/A 07/15/2014   Procedure: DILATATION & CURETTAGE/HYSTEROSCOPY WITH RESECTOCOPE;  Surgeon: Serita Kyle, MD;  Location: [facility];  Service: Gynecology;  Laterality: N/A;  . DILATION AND CURETTAGE OF UTERUS     polyps  . LEFT HEART CATH AND CORONARY ANGIOGRAPHY N/A 04/16/2018   Procedure: LEFT HEART CATH AND CORONARY ANGIOGRAPHY;  Surgeon: Lyn Records, MD;  Location: [facility];  Service: Cardiovascular;  Laterality: N/A;  . right thigh surgery     cancer  . WISDOM TOOTH EXTRACTION      Current Medications: Current Meds  Medication Sig  .  albuterol (VENTOLIN HFA) 108 (90 Base) MCG/ACT inhaler 2 puffs as needed  . amLODipine (NORVASC) 5 MG tablet TAKE 1 TABLET BY MOUTH EVERY DAY  . Ascorbic Acid (VITAMIN C) 500 MG CHEW Chew 500 mg by mouth daily.  Marland Kitchen atorvastatin (LIPITOR) 80 MG tablet TAKE 1 TABLET BY MOUTH EVERY DAY  . B Complex-Biotin-FA (B-50 COMPLEX PO) 50 mg daily.  . Biotin 00938 MCG TABS Take 10,000 mcg by mouth daily.  . budesonide-formoterol (SYMBICORT) 160-4.5 MCG/ACT inhaler Inhale 2 puffs into the lungs daily as needed (shortness of breath or wheezing).   . Cholecalciferol ([employer] D3) 50 MCG (2000 UT) CAPS Take 50 mcg by mouth daily.  Marland Kitchen ELIQUIS 5 MG TABS tablet TAKE 1 TABLET BY MOUTH TWICE A DAY  . isosorbide mononitrate (IMDUR) 30 MG 24 hr tablet TAKE 1/2 TABLETS (15 MG TOTAL) BY MOUTH ONCE DAILY.  Marland Kitchen levothyroxine (SYNTHROID, LEVOTHROID) 75 MCG tablet Take 75 mcg by mouth daily before breakfast.   . LINZESS 145 MCG CAPS capsule Take 145 mcg by mouth daily.  Marland Kitchen losartan-hydrochlorothiazide (HYZAAR) 50-12.5 MG tablet Take 1 tablet by mouth daily.  . Multiple Vitamin (MULTIVITAMIN WITH MINERALS) TABS Take 1 tablet by mouth daily.  . nitroGLYCERIN (NITROSTAT) 0.4 MG SL tablet TAKE 1 TABLET BY MOUTH EVERY 5 MINUTES AS NEEDED FOR CHEST PAIN  .  pregabalin (LYRICA) 75 MG capsule Take 75 mg by mouth 3 (three) times daily.  . traMADol (ULTRAM) 50 MG tablet Take 1 tablet (50 mg total) by mouth 2 (two) times daily as needed for moderate pain.  . vitamin E 400 UNIT capsule Take 400 Units by mouth daily.     Allergies:   Codeine and Vicodin [hydrocodone-acetaminophen]   Social History   Socioeconomic History  . Marital status: Married    Spouse name: Not on file  . Number of children: Not on file  . Years of education: Not on file  . Highest education level: Not on file  Occupational History  . Not on file  Tobacco Use  . Smoking status: Never Smoker  . Smokeless tobacco: Never Used  Vaping Use  . Vaping Use: Never used   Substance and Sexual Activity  . Alcohol use: Yes    Alcohol/week: 0.0 standard drinks    Comment: social - wine  . Drug use: No  . Sexual activity: Yes    Birth control/protection: Post-menopausal  Other Topics Concern  . Not on file  Social History Narrative   Lives alone in a one story home.  Has 5 children.  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    Social Determinants of Health   Financial Resource Strain: Not on file  Food Insecurity: Not on file  Transportation Needs: Not on file  Physical Activity: Not on file  Stress: Not on file  Social Connections: Not on file     Family History: The patient's family history includes CVA in her mother; Cancer in her father; Diabetes in her father and sister; Heart attack in her sister; Hypertension in her mother.  ROS:   Please see the history of present illness.    Seems depressed and anxious.  She is disabled related to her fibromyxoid.  She wants to get papers filled out related to her physical capabilities related to heart.  All other systems reviewed and are negative.  EKGs/Labs/Other Studies Reviewed:    The following studies were reviewed today: CARDIAC CATH 2019 Diagnostic Dominance: Right     Right dominant coronary system.  40 to 50% ostial RCA stenosis.  Normal left main.  Apical 70% LAD after the third diagonal branch.  The LAD and diagonals are otherwise normal.  Widely patent circumflex artery without significant obstruction.  Normal left ventricular systolic function.  EF greater than 50%.  LVEDP.  RECOMMENDATIONS:   Medical therapy for moderate coronary disease.  Symptoms could be related to the distal/apical LAD lesion.  Consider beta-blocker or long-acting nitrates if recurring episodes of chest pain.  Is also conceivable the chest pain was not cardiac in nature.  Aggressive risk factor modification:  LDL less than 70, aspirin 81 mg daily, blood pressure control to 130/80 mmHg or less, hemoglobin A1c less than 70, aerobic activity as tolerated.  Recommend Aspirin 81mg  daily for moderate CAD.  Carotid Doppler study March 2021: Summary:  Right Carotid: Velocities in the right ICA are consistent with a 1-39%  stenosis.         Non-hemodynamically significant plaque <50% noted in the  CCA.   Left Carotid: Velocities in the left ICA are consistent with a 1-39%  stenosis.   Vertebrals: Bilateral vertebral arteries demonstrate antegrade flow.  Subclavians: Normal flow hemodynamics were seen in bilateral subclavian        arteries.  EKG:  EKG normal sinus rhythm, inferior infarct, incomplete right bundle, left atrial abnormality.  When compared to, no changes noted.  Recent Labs: No results found for requested labs within last 8760 hours.  Recent Lipid Panel    Component Value Date/Time   CHOL 206 (H) 04/16/2018 0605   TRIG 81 04/16/2018 0605   HDL 46 04/16/2018 0605   CHOLHDL 4.5 04/16/2018 0605   VLDL 16 04/16/2018 0605   LDLCALC 144 (H) 04/16/2018 0605    Physical Exam:    VS:  BP 126/74   Pulse 90   Ht 5\' 8"  (1.727 m)   Wt 208 lb 6.4 oz (94.5 kg)   SpO2 95%   BMI 31.69 kg/m     Wt Readings from Last 3 Encounters:  09/08/20 208 lb 6.4 oz (94.5 kg)  10/28/19 200 lb 12.8 oz (91.1 kg)  07/16/19 206 lb (93.4 kg)     GEN: Obese. No acute distress HEENT: Normal NECK: No JVD. LYMPHATICS: No lymphadenopathy CARDIAC: No murmur. RRR no gallop, or edema. VASCULAR:  Normal Pulses. No bruits. RESPIRATORY:  Clear to auscultation without rales, wheezing or rhonchi  ABDOMEN: Soft, non-tender, non-distended, No pulsatile mass, MUSCULOSKELETAL: No deformity  SKIN: Warm and dry NEUROLOGIC:  Alert and oriented x 3 PSYCHIATRIC:  Normal affect   ASSESSMENT:    1. Coronary artery disease of native artery of native heart with stable angina pectoris ([place])   2.  Essential hypertension   3. Paroxysmal atrial fibrillation (HCC)   4. Chronic anticoagulation   5. Elevated lipoprotein(a)   6. Educated about COVID-19 virus infection   7. Excessive daytime sleepiness    PLAN:    In order of problems listed above:  1. Secondary prevention discussed in detail 2. Well-controlled 3. 30-day monitor due to history of increasing episodes of heart racing. 4. No bleeding complications 5. Excellent lipid profile in September.  Continue current therapy. 6. Vaccinated and boosted 7. Sleep study to rule out sleep apnea aggravating overall cardiovascular system and symptoms.  Overall education and awareness concerning primary/secondary risk prevention was discussed in detail: LDL less than 70, hemoglobin A1c less than 7, blood pressure target less than 130/80 mmHg, >150 minutes of moderate aerobic activity per week, avoidance of smoking, weight control (via diet and exercise), and continued surveillance/management of/for obstructive sleep apnea.    Medication Adjustments/Labs and Tests Ordered: Current medicines are reviewed at length with the patient today.  Concerns regarding medicines are outlined above.  No orders of the defined types were placed in this encounter.  No orders of the defined types were placed in this encounter.   There are no Patient Instructions on file for this visit.   Signed, Sinclair Grooms, MD  09/08/2020 8:48 AM    [place]

## 2020-09-08 ENCOUNTER — Ambulatory Visit (INDEPENDENT_AMBULATORY_CARE_PROVIDER_SITE_OTHER): Payer: BC Managed Care – PPO | Admitting: Interventional Cardiology

## 2020-09-08 ENCOUNTER — Encounter: Payer: Self-pay | Admitting: *Deleted

## 2020-09-08 ENCOUNTER — Encounter: Payer: Self-pay | Admitting: Interventional Cardiology

## 2020-09-08 ENCOUNTER — Other Ambulatory Visit: Payer: Self-pay

## 2020-09-08 ENCOUNTER — Telehealth: Payer: Self-pay | Admitting: *Deleted

## 2020-09-08 VITALS — BP 126/74 | HR 90 | Ht 68.0 in | Wt 208.4 lb

## 2020-09-08 DIAGNOSIS — Z7189 Other specified counseling: Secondary | ICD-10-CM | POA: Diagnosis not present

## 2020-09-08 DIAGNOSIS — E7841 Elevated Lipoprotein(a): Secondary | ICD-10-CM | POA: Diagnosis not present

## 2020-09-08 DIAGNOSIS — I25118 Atherosclerotic heart disease of native coronary artery with other forms of angina pectoris: Secondary | ICD-10-CM

## 2020-09-08 DIAGNOSIS — G4719 Other hypersomnia: Secondary | ICD-10-CM | POA: Diagnosis not present

## 2020-09-08 DIAGNOSIS — Z7901 Long term (current) use of anticoagulants: Secondary | ICD-10-CM

## 2020-09-08 DIAGNOSIS — I480 Paroxysmal atrial fibrillation: Secondary | ICD-10-CM

## 2020-09-08 DIAGNOSIS — I10 Essential (primary) hypertension: Secondary | ICD-10-CM | POA: Diagnosis not present

## 2020-09-08 DIAGNOSIS — R002 Palpitations: Secondary | ICD-10-CM | POA: Diagnosis not present

## 2020-09-08 NOTE — Patient Instructions (Signed)
Medication Instructions:  Your physician recommends that you continue on your current medications as directed. Please refer to the Current Medication list given to you today.  *If you need a refill on your cardiac medications before your next appointment, please call your pharmacy*   Lab Work: None If you have labs (blood work) drawn today and your tests are completely normal, you will receive your results only by: Marland Kitchen MyChart Message (if you have MyChart) OR . A paper copy in the mail If you have any lab test that is abnormal or we need to change your treatment, we will call you to review the results.   Testing/Procedures: Your physician has recommended that you wear an event monitor. Event monitors are medical devices that record the heart's electrical activity. Doctors most often [place] these monitors to diagnose arrhythmias. Arrhythmias are problems with the speed or rhythm of the heartbeat. The monitor is a small, portable device. You can wear one while you do your normal daily activities. This is usually used to diagnose what is causing palpitations/syncope (passing out).  Your physician has recommended that you have a sleep study. This test records several body functions during sleep, including: brain activity, eye movement, oxygen and carbon dioxide blood levels, heart rate and rhythm, breathing rate and rhythm, the flow of air through your mouth and nose, snoring, body muscle movements, and chest and belly movement.    Follow-Up: At [facility], you and your health needs are our priority.  As part of our continuing mission to provide you with exceptional heart care, we have created designated Provider Care Teams.  These Care Teams include your primary Cardiologist (physician) and Advanced Practice Providers (APPs -  Physician Assistants and Nurse Practitioners) who all work together to provide you with the care you need, when you need it.  We recommend signing up for the patient portal  called "MyChart".  Sign up information is provided on this After Visit Summary.  MyChart is used to connect with patients for Virtual Visits (Telemedicine).  Patients are able to view lab/test results, encounter notes, upcoming appointments, etc.  Non-urgent messages can be sent to your provider as well.   To learn more about what you can do with MyChart, go to [email or web address].    Your next appointment:   4 month(s)  The format for your next appointment:   In Person  Provider:   You may see Sinclair Grooms, MD or one of the following Advanced Practice Providers on your designated Care Team:    Truitt Merle, NP  Cecilie Kicks, NP  Kathyrn Drown, NP    Other Instructions  [employer] Cardiac Event Monitor Instructions Your physician has requested you wear your cardiac event monitor for 30 days. [employer] may call or text to confirm a shipping address. The monitor will be sent to a land address via UPS. [employer] will not ship a monitor to a PO BOX. It typically takes 3-5 days to receive your monitor after it has been enrolled. [employer] will assist with USPS tracking if your package is delayed. The telephone number for [employer] is [phone]. Once you have received your monitor, please review the enclosed instructions. Instruction tutorials can also be viewed under help and settings on the enclosed cell phone. Your monitor has already been registered assigning a specific monitor serial # to you.  Applying the monitor Remove cell phone from case and turn it on. The cell phone works as Dealer and needs to be within [employer]  of you at all times. The cell phone will need to be charged on a daily basis. We recommend you plug the cell phone into the enclosed charger at your bedside table every night.  Monitor batteries: You will receive two monitor batteries labelled #1 and #2. These are your recorders. Plug battery #2 onto the second connection on the  enclosed charger. Keep one battery on the charger at all times. This will keep the monitor battery deactivated. It will also keep it fully charged for when you need to switch your monitor batteries. A small light will be blinking on the battery emblem when it is charging. The light on the battery emblem will remain on when the battery is fully charged.  Open package of a Monitor strip. Insert battery #1 into black hood on strip and gently squeeze monitor battery onto connection as indicated in instruction booklet. Set aside while preparing skin.  Choose location for your strip, vertical or horizontal, as indicated in the instruction booklet. Shave to remove all hair from location. There cannot be any lotions, oils, powders, or colognes on skin where monitor is to be applied. Wipe skin clean with enclosed Saline wipe. Dry skin completely.  Peel paper labeled #1 off the back of the Monitor strip exposing the adhesive. Place the monitor on the chest in the vertical or horizontal position shown in the instruction booklet. One arrow on the monitor strip must be pointing upward. Carefully remove paper labeled #2, attaching remainder of strip to your skin. Try not to create any folds or wrinkles in the strip as you apply it.  Firmly press and release the circle in the center of the monitor battery. You will hear a small beep. This is turning the monitor battery on. The heart emblem on the monitor battery will light up every 5 seconds if the monitor battery in turned on and connected to the patient securely. Do not push and hold the circle down as this turns the monitor battery off. The cell phone will locate the monitor battery. A screen will appear on the cell phone checking the connection of your monitor strip. This may read poor connection initially but change to good connection within the next minute. Once your monitor accepts the connection you will hear a series of 3 beeps followed by a  climbing crescendo of beeps. A screen will appear on the cell phone showing the two monitor strip placement options. Touch the picture that demonstrates where you applied the monitor strip.  Your monitor strip and battery are waterproof. You are able to shower, bathe, or swim with the monitor on. They just ask you do not submerge deeper than 3 feet underwater. We recommend removing the monitor if you are swimming in a lake, river, or ocean.  Your monitor battery will need to be switched to a fully charged monitor battery approximately once a week. The cell phone will alert you of an action which needs to be made.  On the cell phone, tap for details to reveal connection status, monitor battery status, and cell phone battery status. The green dots indicates your monitor is in good status. A red dot indicates there is something that needs your attention.  To record a symptom, click the circle on the monitor battery. In 30-60 seconds a list of symptoms will appear on the cell phone. Select your symptom and tap save. Your monitor will record a sustained or significant arrhythmia regardless of you clicking the button. Some patients do not feel  the heart rhythm irregularities. [employer] will notify us of any serious or critical events.  Refer to instruction booklet for instructions on switching batteries, changing strips, the Do not disturb or Pause features, or any additional questions.  Call [employer] at [phone], to confirm your monitor is transmitting and record your baseline. They will answer any questions you may have regarding the monitor instructions at that time.  Returning the monitor to [employer] Place all equipment back into blue box. Peel off strip of paper to expose adhesive and close box securely. There is a prepaid UPS shipping label on this box. Drop in a UPS drop box, or at a UPS facility like [employer]. You may also contact [employer] to arrange UPS to pick up monitor  package at your home.

## 2020-09-08 NOTE — Telephone Encounter (Signed)
Sleep study ordered for snoring and daytime sleepiness. Pt currently on [employer] but will also have Medicare starting next month because she got her disability approved.

## 2020-09-08 NOTE — Progress Notes (Signed)
Patient ID: Whitney Daniel, female   DOB: Sep 03, 1956, 65 y.o.   MRN: [id number] Patient enrolled for [employer] to ship a 30 day cardiac event monitor to her home.

## 2020-09-08 NOTE — Telephone Encounter (Signed)
-----   Message from Julio Sicks, RN sent at 09/08/2020  8:57 AM EST ----- Order placed for sleep study

## 2020-09-15 ENCOUNTER — Other Ambulatory Visit: Payer: Self-pay | Admitting: Cardiology

## 2020-09-15 DIAGNOSIS — I25118 Atherosclerotic heart disease of native coronary artery with other forms of angina pectoris: Secondary | ICD-10-CM

## 2020-09-15 DIAGNOSIS — M1612 Unilateral primary osteoarthritis, left hip: Secondary | ICD-10-CM | POA: Diagnosis not present

## 2020-09-17 ENCOUNTER — Ambulatory Visit (INDEPENDENT_AMBULATORY_CARE_PROVIDER_SITE_OTHER): Payer: BC Managed Care – PPO

## 2020-09-17 DIAGNOSIS — R002 Palpitations: Secondary | ICD-10-CM | POA: Diagnosis not present

## 2020-09-17 DIAGNOSIS — I480 Paroxysmal atrial fibrillation: Secondary | ICD-10-CM | POA: Diagnosis not present

## 2020-09-18 ENCOUNTER — Other Ambulatory Visit: Payer: Self-pay | Admitting: Cardiology

## 2020-09-21 DIAGNOSIS — I4891 Unspecified atrial fibrillation: Secondary | ICD-10-CM | POA: Diagnosis not present

## 2020-09-21 DIAGNOSIS — E785 Hyperlipidemia, unspecified: Secondary | ICD-10-CM | POA: Diagnosis not present

## 2020-09-21 DIAGNOSIS — I252 Old myocardial infarction: Secondary | ICD-10-CM | POA: Diagnosis not present

## 2020-09-21 DIAGNOSIS — I2510 Atherosclerotic heart disease of native coronary artery without angina pectoris: Secondary | ICD-10-CM | POA: Diagnosis not present

## 2020-09-21 DIAGNOSIS — E1142 Type 2 diabetes mellitus with diabetic polyneuropathy: Secondary | ICD-10-CM | POA: Diagnosis not present

## 2020-09-21 DIAGNOSIS — E039 Hypothyroidism, unspecified: Secondary | ICD-10-CM | POA: Diagnosis not present

## 2020-09-21 DIAGNOSIS — G8929 Other chronic pain: Secondary | ICD-10-CM | POA: Diagnosis not present

## 2020-09-21 DIAGNOSIS — E669 Obesity, unspecified: Secondary | ICD-10-CM | POA: Diagnosis not present

## 2020-09-21 DIAGNOSIS — I10 Essential (primary) hypertension: Secondary | ICD-10-CM | POA: Diagnosis not present

## 2020-09-21 DIAGNOSIS — D6869 Other thrombophilia: Secondary | ICD-10-CM | POA: Diagnosis not present

## 2020-09-22 ENCOUNTER — Other Ambulatory Visit: Payer: Self-pay

## 2020-09-22 DIAGNOSIS — I25118 Atherosclerotic heart disease of native coronary artery with other forms of angina pectoris: Secondary | ICD-10-CM

## 2020-09-22 MED ORDER — AMLODIPINE BESYLATE 5 MG PO TABS: 5.0000 mg | ORAL_TABLET | Freq: Every day | ORAL | 3 refills | Status: DC

## 2020-09-22 NOTE — Telephone Encounter (Signed)
Pt calling requesting Dr. Tamala Julian to refill her amlodipine. This medication was previous refilled by Dr. Einar Gip, pt no longer sees Dr. Einar Gip. Would Dr. Tamala Julian like to refill this medication? Please address

## 2020-09-22 NOTE — Telephone Encounter (Signed)
That's fine

## 2020-09-22 NOTE — Telephone Encounter (Signed)
Pt's medication was sent to pt's pharmacy as requested. Confirmation received.  °

## 2020-10-05 ENCOUNTER — Other Ambulatory Visit: Payer: Self-pay | Admitting: Pain Medicine

## 2020-10-05 DIAGNOSIS — M25512 Pain in left shoulder: Secondary | ICD-10-CM

## 2020-10-07 ENCOUNTER — Telehealth: Payer: Self-pay | Admitting: *Deleted

## 2020-10-07 NOTE — Telephone Encounter (Signed)
Staff message sent to Gae Bon to check to see if patient's insurance has changed from [facility] to [employer]. Per a staff message note it is supposed to change this month. If so ok to schedule sleep study.

## 2020-10-10 ENCOUNTER — Telehealth: Payer: Self-pay | Admitting: Cardiology

## 2020-10-12 ENCOUNTER — Other Ambulatory Visit: Payer: Self-pay | Admitting: Pain Medicine

## 2020-10-12 ENCOUNTER — Other Ambulatory Visit: Payer: Self-pay

## 2020-10-12 ENCOUNTER — Ambulatory Visit
Admission: RE | Admit: 2020-10-12 | Discharge: 2020-10-12 | Disposition: A | Discharge status: Home / Self Care | Payer: BC Managed Care – PPO | Source: Ambulatory Visit | Attending: Pain Medicine | Admitting: Pain Medicine

## 2020-10-12 DIAGNOSIS — M19012 Primary osteoarthritis, left shoulder: Secondary | ICD-10-CM | POA: Diagnosis not present

## 2020-10-12 DIAGNOSIS — M25512 Pain in left shoulder: Secondary | ICD-10-CM

## 2020-10-13 DIAGNOSIS — Z79891 Long term (current) use of opiate analgesic: Secondary | ICD-10-CM | POA: Diagnosis not present

## 2020-10-13 DIAGNOSIS — M1612 Unilateral primary osteoarthritis, left hip: Secondary | ICD-10-CM | POA: Diagnosis not present

## 2020-10-13 DIAGNOSIS — M2550 Pain in unspecified joint: Secondary | ICD-10-CM | POA: Diagnosis not present

## 2020-10-13 DIAGNOSIS — M5136 Other intervertebral disc degeneration, lumbar region: Secondary | ICD-10-CM | POA: Diagnosis not present

## 2020-10-13 DIAGNOSIS — Z79899 Other long term (current) drug therapy: Secondary | ICD-10-CM | POA: Diagnosis not present

## 2020-10-13 DIAGNOSIS — G894 Chronic pain syndrome: Secondary | ICD-10-CM | POA: Diagnosis not present

## 2020-10-13 NOTE — Telephone Encounter (Signed)
*  STAT* If patient is at the pharmacy, call can be transferred to refill team.   1. Which medications need to be refilled? (please list name of each medication and dose if known) Atorvastatin    2. Which pharmacy/location (including street and city if local pharmacy) is medication to be sent to? [id number] [place] [place] - [place], [place] [address]  Phone:  [phone] Fax:  [phone]    3. Do they need a 30 day or 90 day supply? [place]

## 2020-10-13 NOTE — Telephone Encounter (Signed)
Pt calling requesting a refill on atorvastatin. Pt states that she no longer sees Dr. Virgina Jock and would like Dr. Tamala Julian to refill this medication. Would Dr. Tamala Julian like to refill this medication? Please address

## 2020-10-13 NOTE — Telephone Encounter (Signed)
Reached out to patient lmtcb if her insurance has changed.

## 2020-10-14 MED ORDER — ATORVASTATIN CALCIUM 80 MG PO TABS
80.0000 mg | ORAL_TABLET | Freq: Every day | ORAL | 3 refills | Status: DC
Start: 2020-10-14 — End: 2021-08-19

## 2020-10-14 NOTE — Addendum Note (Signed)
Addended by: Carter Kitten D on: 10/14/2020 08:11 AM   Modules accepted: Orders

## 2020-10-14 NOTE — Telephone Encounter (Signed)
Ok to fill 

## 2020-10-14 NOTE — Telephone Encounter (Signed)
Pt's medication was sent to pt's pharmacy as requested. Confirmation received.  °

## 2020-10-26 NOTE — Telephone Encounter (Signed)
Patient is scheduled for lab study on 12/03/20. Patient understands her sleep study will be done at [facility] sleep lab. Patient understands she will receive a sleep packet in a week or so. Patient understands to call if she does not receive the sleep packet in a timely manner. Patient agrees with treatment and thanked me for call.

## 2020-10-30 ENCOUNTER — Ambulatory Visit
Admission: RE | Admit: 2020-10-30 | Discharge: 2020-10-30 | Disposition: A | Discharge status: Home / Self Care | Payer: BC Managed Care – PPO | Source: Ambulatory Visit | Attending: Pain Medicine | Admitting: Pain Medicine

## 2020-10-30 ENCOUNTER — Other Ambulatory Visit: Payer: Self-pay

## 2020-10-30 DIAGNOSIS — M19012 Primary osteoarthritis, left shoulder: Secondary | ICD-10-CM | POA: Diagnosis not present

## 2020-10-30 DIAGNOSIS — M25512 Pain in left shoulder: Secondary | ICD-10-CM

## 2020-10-30 DIAGNOSIS — R600 Localized edema: Secondary | ICD-10-CM | POA: Diagnosis not present

## 2020-10-30 DIAGNOSIS — S43005A Unspecified dislocation of left shoulder joint, initial encounter: Secondary | ICD-10-CM | POA: Diagnosis not present

## 2020-11-10 ENCOUNTER — Ambulatory Visit
Admission: RE | Admit: 2020-11-10 | Discharge: 2020-11-10 | Disposition: A | Discharge status: Home / Self Care | Payer: BC Managed Care – PPO | Source: Ambulatory Visit | Attending: Pain Medicine | Admitting: Pain Medicine

## 2020-11-10 ENCOUNTER — Other Ambulatory Visit: Payer: Self-pay | Admitting: Pain Medicine

## 2020-11-10 DIAGNOSIS — M25511 Pain in right shoulder: Secondary | ICD-10-CM

## 2020-11-10 DIAGNOSIS — M1612 Unilateral primary osteoarthritis, left hip: Secondary | ICD-10-CM | POA: Diagnosis not present

## 2020-11-10 DIAGNOSIS — M5136 Other intervertebral disc degeneration, lumbar region: Secondary | ICD-10-CM | POA: Diagnosis not present

## 2020-11-10 DIAGNOSIS — M19011 Primary osteoarthritis, right shoulder: Secondary | ICD-10-CM | POA: Diagnosis not present

## 2020-11-10 DIAGNOSIS — M47817 Spondylosis without myelopathy or radiculopathy, lumbosacral region: Secondary | ICD-10-CM | POA: Diagnosis not present

## 2020-11-10 DIAGNOSIS — M67819 Other specified disorders of synovium and tendon, unspecified shoulder: Secondary | ICD-10-CM | POA: Diagnosis not present

## 2020-11-29 NOTE — Progress Notes (Signed)
Pt has been made aware of normal result and verbalized understanding.  jw

## 2020-12-03 ENCOUNTER — Other Ambulatory Visit: Payer: Self-pay

## 2020-12-03 ENCOUNTER — Ambulatory Visit (HOSPITAL_BASED_OUTPATIENT_CLINIC_OR_DEPARTMENT_OTHER): Payer: BC Managed Care – PPO | Attending: Interventional Cardiology | Admitting: Cardiology

## 2020-12-03 DIAGNOSIS — R0683 Snoring: Secondary | ICD-10-CM | POA: Diagnosis not present

## 2020-12-03 DIAGNOSIS — G4719 Other hypersomnia: Secondary | ICD-10-CM

## 2020-12-07 NOTE — Procedures (Signed)
   Patient Name: Whitney, Daniel Date: 12/03/2020 Gender: Female D.O.B: [birth year] Age (years): 65 Referring Provider: Daneen Schick Height (inches): 33 Interpreting Physician: Fransico Him MD, ABSM Weight (lbs): 206 RPSGT: Earney Hamburg BMI: 31 MRN: [id number] Neck Size: 15.00  CLINICAL INFORMATION Sleep Study Type: NPSG  Indication for sleep study: N/A  Epworth Sleepiness Score: 10  SLEEP STUDY TECHNIQUE As per the AASM Manual for the Scoring of Sleep and Associated Events v2.3 (April 2016) with a hypopnea requiring 4% desaturations.  The channels recorded and monitored were frontal, central and occipital EEG, electrooculogram (EOG), submentalis EMG (chin), nasal and oral airflow, thoracic and abdominal wall motion, anterior tibialis EMG, snore microphone, electrocardiogram, and pulse oximetry.  MEDICATIONS Medications self-administered by patient taken the night of the study : MELATONIN, ATORVASTATIN, ELIQUIS, PREGABALIN, AMLODIPINE  SLEEP ARCHITECTURE The study was initiated at 10:21:23 PM and ended at 4:24:24 AM.  Sleep onset time was 46.9 minutes and the sleep efficiency was 74.7%. The total sleep time was 271 minutes.  Stage REM latency was 117.0 minutes.  The patient spent 2.8% of the night in stage N1 sleep, 84.5% in stage N2 sleep, 0.0% in stage N3 and 12.7% in REM.  Alpha intrusion was absent.  Supine sleep was 35.79%.  RESPIRATORY PARAMETERS The overall apnea/hypopnea index (AHI) was 1.8 per hour. There were 0 total apneas, including 0 obstructive, 0 central and 0 mixed apneas. There were 8 hypopneas and 35 RERAs.  The AHI during Stage REM sleep was 10.4 per hour.  AHI while supine was 1.9 per hour.  The mean oxygen saturation was 94.8%. The minimum SpO2 during sleep was 90.0%.  soft snoring was noted during this study.  CARDIAC DATA The 2 lead EKG demonstrated sinus rhythm. The mean heart rate was 71.5 beats per minute. Other EKG findings  include: None.  LEG MOVEMENT DATA The total PLMS were 0 with a resulting PLMS index of 0.0. Associated arousal with leg movement index was 3.5 .  IMPRESSIONS - No significant obstructive sleep apnea occurred during this study (AHI = 1.8/h). - The patient had minimal or no oxygen desaturation during the study (Min O2 = 90.0%) - The patient snored with soft snoring volume. - No cardiac abnormalities were noted during this study. - Clinically significant periodic limb movements did not occur during sleep. No significant associated arousals.  DIAGNOSIS  - Normal Study  RECOMMENDATIONS - Avoid alcohol, sedatives and other CNS depressants that may worsen sleep apnea and disrupt normal sleep architecture. - Sleep hygiene should be reviewed to assess factors that may improve sleep quality. - Weight management and regular exercise should be initiated or continued if appropriate.  [Electronically signed] 12/07/2020 07:13 PM  Fransico Him MD, ABSM Diplomate, American Board of Sleep Medicine

## 2020-12-08 DIAGNOSIS — G894 Chronic pain syndrome: Secondary | ICD-10-CM | POA: Diagnosis not present

## 2020-12-08 DIAGNOSIS — M25519 Pain in unspecified shoulder: Secondary | ICD-10-CM | POA: Diagnosis not present

## 2020-12-08 DIAGNOSIS — M5136 Other intervertebral disc degeneration, lumbar region: Secondary | ICD-10-CM | POA: Diagnosis not present

## 2020-12-08 DIAGNOSIS — M67819 Other specified disorders of synovium and tendon, unspecified shoulder: Secondary | ICD-10-CM | POA: Diagnosis not present

## 2020-12-15 ENCOUNTER — Telehealth: Payer: Self-pay | Admitting: *Deleted

## 2020-12-15 NOTE — Telephone Encounter (Signed)
Informed patient of sleep study results and patient understanding was verbalized. Patient understands her sleep study showed no significant sleep apnea.   Pt is aware and agreeable to normal results.

## 2020-12-15 NOTE — Telephone Encounter (Signed)
-----   Message from Sueanne Margarita, MD sent at 12/07/2020  7:16 PM EDT ----- Please let patient know that sleep study showed no significant sleep apnea.

## 2021-01-03 ENCOUNTER — Other Ambulatory Visit: Payer: Self-pay | Admitting: Nurse Practitioner

## 2021-01-03 DIAGNOSIS — K760 Fatty (change of) liver, not elsewhere classified: Secondary | ICD-10-CM | POA: Diagnosis not present

## 2021-01-03 DIAGNOSIS — K7400 Hepatic fibrosis, unspecified: Secondary | ICD-10-CM | POA: Diagnosis not present

## 2021-01-03 DIAGNOSIS — K7402 Hepatic fibrosis, advanced fibrosis: Secondary | ICD-10-CM | POA: Diagnosis not present

## 2021-01-09 NOTE — Progress Notes (Signed)
Cardiology Office Note:    Date:  01/10/2021   ID:  Whitney, Daniel [birth year], MRN [id number]  PCP:  London Pepper, MD  Cardiologist:  Sinclair Grooms, MD   Referring MD: London Pepper, MD   Chief Complaint  Patient presents with  . Atrial Fibrillation    History of Present Illness:    Whitney Daniel is a 65 y.o. 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   [name] is trying to exercise.  She does get some chest tightness and dyspnea at a certain level of physical activity.  If she slows down, it goes away.  She feels that this symptom has progressively increasing.  It does not prevent activity but causes curtailment and exertion applied.  She denies shortness of breath.  The recent Doppler study does not demonstrate instances of atrial fibrillation.  Past Medical History:  Diagnosis Date  . A-fib ([place])   . Anemia   . Cancer ([place])   . Fibromyalgia   . Hypertension   . Hypothyroidism   . Osteoarthritis   . SVD (spontaneous vaginal delivery)    x 5    Past Surgical History:  Procedure Laterality Date  . COLONOSCOPY    . DILATATION & CURRETTAGE/HYSTEROSCOPY WITH RESECTOCOPE N/A 07/15/2014   Procedure: DILATATION & CURETTAGE/HYSTEROSCOPY WITH RESECTOCOPE;  Surgeon: Marvene Staff, MD;  Location: [facility];  Service: Gynecology;  Laterality: N/A;  . DILATION AND CURETTAGE OF UTERUS     polyps  . LEFT HEART CATH AND CORONARY ANGIOGRAPHY N/A 04/16/2018   Procedure: LEFT HEART CATH AND CORONARY ANGIOGRAPHY;  Surgeon: Belva Crome, MD;  Location: [facility];  Service: Cardiovascular;  Laterality: N/A;  . right thigh surgery     cancer  . WISDOM TOOTH EXTRACTION      Current Medications: Current Meds  Medication Sig  . albuterol (VENTOLIN HFA) 108 (90 Base) MCG/ACT inhaler 2 puffs as needed  . amLODipine (NORVASC) 5 MG tablet Take 1 tablet (5 mg total) by mouth  daily.  . Ascorbic Acid (VITAMIN C) 500 MG CHEW Chew 500 mg by mouth daily.  Marland Kitchen atorvastatin (LIPITOR) 80 MG tablet Take 1 tablet (80 mg total) by mouth daily.  . B Complex-Biotin-FA (B-50 COMPLEX PO) 50 mg daily.  . Biotin 10000 MCG TABS Take 10,000 mcg by mouth daily.  . budesonide-formoterol (SYMBICORT) 160-4.5 MCG/ACT inhaler Inhale 2 puffs into the lungs daily as needed (shortness of breath or wheezing).   . Cholecalciferol ([employer] D3) 50 MCG (2000 UT) CAPS Take 50 mcg by mouth daily.  Marland Kitchen ELIQUIS 5 MG TABS tablet TAKE 1 TABLET BY MOUTH TWICE A DAY  . hydrOXYzine (ATARAX/VISTARIL) 25 MG tablet Take 25 mg by mouth as needed for irritation.  . isosorbide mononitrate (IMDUR) 30 MG 24 hr tablet TAKE 1/2 TABLETS (15 MG TOTAL) BY MOUTH ONCE DAILY.  Marland Kitchen levothyroxine (SYNTHROID, LEVOTHROID) 75 MCG tablet Take 75 mcg by mouth daily before breakfast.   . LINZESS 145 MCG CAPS capsule Take 145 mcg by mouth daily.  Marland Kitchen losartan-hydrochlorothiazide (HYZAAR) 50-12.5 MG tablet Take 1 tablet by mouth daily.  . Multiple Vitamin (MULTIVITAMIN WITH MINERALS) TABS Take 1 tablet by mouth daily.  . nitroGLYCERIN (NITROSTAT) 0.4 MG SL tablet TAKE 1 TABLET BY MOUTH EVERY 5 MINUTES AS NEEDED FOR CHEST PAIN  . pregabalin (LYRICA) 75 MG capsule Take 75 mg by mouth 3 (three) times daily.  . traMADol (  ULTRAM) 50 MG tablet Take 1 tablet (50 mg total) by mouth 2 (two) times daily as needed for moderate pain.     Allergies:   Codeine and Vicodin [hydrocodone-acetaminophen]   Social History   Socioeconomic History  . Marital status: Married    Spouse name: Not on file  . Number of children: Not on file  . Years of education: Not on file  . Highest education level: Not on file  Occupational History  . Not on file  Tobacco Use  . Smoking status: Never Smoker  . Smokeless tobacco: Never Used  Vaping Use  . Vaping Use: Never used  Substance and Sexual Activity  . Alcohol use: Yes    Alcohol/week: 0.0 standard drinks     Comment: social - wine  . Drug use: No  . Sexual activity: Yes    Birth control/protection: Post-menopausal  Other Topics Concern  . Not on file  Social History Narrative   Lives alone in a one story home.  Has 5 children.  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    Social Determinants of Health   Financial Resource Strain: Not on file  Food Insecurity: Not on file  Transportation Needs: Not on file  Physical Activity: Not on file  Stress: Not on file  Social Connections: Not on file     Family History: The patient's family history includes CVA in her mother; Cancer in her father; Diabetes in her father and sister; Heart attack in her sister; Hypertension in her mother.  ROS:   Please see the history of present illness.    Decreased physical activity tolerance.  Occasional strike like discomfort in the left and right side of her head that lasts seconds all other systems reviewed and are negative.  EKGs/Labs/Other Studies Reviewed:    The following studies were reviewed today:  CARDIAC TELEMETRY 10/2020: Study Highlights   Normal sinus rhythm with HR range 56-136 bpm. Avg HR 65 bpm  No atrial fibrillation.  Rare PVC's with on 4 beat NSVT.  No correlation between palpitations and arrhythmia.  Coronary angiography 2019: Diagnostic Dominance: Right     EKG:  EKG right bundle, left anterior hemiblock, normal sinus rhythm, no change compared to prior  Recent Labs: No results found for requested labs within last 8760 hours.  Recent Lipid Panel    Component Value Date/Time   CHOL 206 (H) 04/16/2018 0605   TRIG 81 04/16/2018 0605   HDL 46 04/16/2018 0605   CHOLHDL 4.5 04/16/2018 0605   VLDL 16 04/16/2018 0605   LDLCALC 144 (H) 04/16/2018 0605    Physical Exam:    VS:  BP 110/70   Pulse 73   Ht 5\' 8"  (1.727 m)   Wt 214 lb (97.1 kg)   SpO2 99%   BMI 32.54 kg/m      Wt Readings from Last 3 Encounters:  01/10/21 214 lb (97.1 kg)  12/03/20 206 lb (93.4 kg)  09/08/20 208 lb 6.4 oz (94.5 kg)     GEN: Overweight. No acute distress HEENT: Normal NECK: No JVD. LYMPHATICS: No lymphadenopathy CARDIAC: No murmur. RRR no gallop, or edema. VASCULAR:  Normal Pulses. No bruits. RESPIRATORY:  Clear to auscultation without rales, wheezing or rhonchi  ABDOMEN: Soft, non-tender, non-distended, No pulsatile mass, MUSCULOSKELETAL: No deformity  SKIN: Warm and dry NEUROLOGIC:  Alert and oriented x 3 PSYCHIATRIC:  Normal affect   ASSESSMENT:  1. Coronary artery disease of native artery of native heart with stable angina pectoris ([place])   2. Essential hypertension   3. Paroxysmal atrial fibrillation ([facility])   4. Chronic anticoagulation   5. Elevated lipoprotein(a)    PLAN:    In order of problems listed above:  1. Secondary prevention discussed.  With recent slow progression of exertional chest tightness and dyspnea, will do coronary CTA to rule out progression of coronary disease.  She had greater than 80% obstruction in the very distal LAD in 2019.  Continue Imdur.  Consider beta-blocker therapy if no significant proximal coronary disease is present. 2. Excellent blood pressure control.  Continue Hyzaar.  Target is 130/80 mmHg. 3. Recent monitor did not demonstrate any instances of atrial fibrillation. 4. No bleeding complications on Eliquis.  Needs blood work twice yearly. 5. No current direct therapy for elevated LP(a).  This may need to be reevaluated.  Overall education and awareness concerning primary/secondary risk prevention was discussed in detail: LDL less than 70, hemoglobin A1c less than 7, blood pressure target less than 130/80 mmHg, >150 minutes of moderate aerobic activity per week, avoidance of smoking, weight control (via diet and exercise), and continued surveillance/management of/for obstructive sleep apnea.    Medication  Adjustments/Labs and Tests Ordered: Current medicines are reviewed at length with the patient today.  Concerns regarding medicines are outlined above.  No orders of the defined types were placed in this encounter.  No orders of the defined types were placed in this encounter.   There are no Patient Instructions on file for this visit.   Signed, Sinclair Grooms, MD  01/10/2021 8:35 AM    [place]

## 2021-01-10 ENCOUNTER — Ambulatory Visit (INDEPENDENT_AMBULATORY_CARE_PROVIDER_SITE_OTHER): Payer: BC Managed Care – PPO | Admitting: Interventional Cardiology

## 2021-01-10 ENCOUNTER — Other Ambulatory Visit: Payer: Self-pay

## 2021-01-10 ENCOUNTER — Encounter: Payer: Self-pay | Admitting: Interventional Cardiology

## 2021-01-10 VITALS — BP 110/70 | HR 73 | Ht 68.0 in | Wt 214.0 lb

## 2021-01-10 DIAGNOSIS — E7841 Elevated Lipoprotein(a): Secondary | ICD-10-CM

## 2021-01-10 DIAGNOSIS — Z7901 Long term (current) use of anticoagulants: Secondary | ICD-10-CM

## 2021-01-10 DIAGNOSIS — I480 Paroxysmal atrial fibrillation: Secondary | ICD-10-CM

## 2021-01-10 DIAGNOSIS — I25118 Atherosclerotic heart disease of native coronary artery with other forms of angina pectoris: Secondary | ICD-10-CM | POA: Diagnosis not present

## 2021-01-10 DIAGNOSIS — R072 Precordial pain: Secondary | ICD-10-CM | POA: Diagnosis not present

## 2021-01-10 DIAGNOSIS — I10 Essential (primary) hypertension: Secondary | ICD-10-CM | POA: Diagnosis not present

## 2021-01-10 LAB — BASIC METABOLIC PANEL
BUN/Creatinine Ratio: 13 (ref 12–28)
BUN: 12 mg/dL (ref 8–27)
CO2: 24 mmol/L (ref 20–29)
Calcium: 10.4 mg/dL — ABNORMAL HIGH (ref 8.7–10.3)
Chloride: 100 mmol/L (ref 96–106)
Creatinine, Ser: 0.94 mg/dL (ref 0.57–1.00)
Glucose: 118 mg/dL — ABNORMAL HIGH (ref 65–99)
Potassium: 4 mmol/L (ref 3.5–5.2)
Sodium: 138 mmol/L (ref 134–144)
eGFR: 68 mL/min/{1.73_m2} (ref >59)

## 2021-01-10 LAB — CBC
Hematocrit: 38.7 % (ref 34.0–46.6)
Hemoglobin: 12.5 g/dL (ref 11.1–15.9)
MCH: 28 pg (ref 26.6–33.0)
MCHC: 32.3 g/dL (ref 31.5–35.7)
MCV: 87 fL (ref 79–97)
Platelets: 292 10*3/uL (ref 150–450)
RBC: 4.47 x10E6/uL (ref 3.77–5.28)
RDW: 13.2 % (ref 11.7–15.4)
WBC: 6.4 10*3/uL (ref 3.4–10.8)

## 2021-01-10 MED ORDER — METOPROLOL TARTRATE 100 MG PO TABS: ORAL_TABLET | ORAL | 0 refills | Status: DC

## 2021-01-10 NOTE — Patient Instructions (Addendum)
Medication Instructions:  Your physician recommends that you continue on your current medications as directed. Please refer to the Current Medication list given to you today.  *If you need a refill on your cardiac medications before your next appointment, please call your pharmacy*   Lab Work: BMET and CBC today If you have labs (blood work) drawn today and your tests are completely normal, you will receive your results only by: Marland Kitchen MyChart Message (if you have MyChart) OR . A paper copy in the mail If you have any lab test that is abnormal or we need to change your treatment, we will call you to review the results.   Testing/Procedures: Your physician is recommending a Coronary CT.   Follow-Up: At [facility], you and your health needs are our priority.  As part of our continuing mission to provide you with exceptional heart care, we have created designated Provider Care Teams.  These Care Teams include your primary Cardiologist (physician) and Advanced Practice Providers (APPs -  Physician Assistants and Nurse Practitioners) who all work together to provide you with the care you need, when you need it.  We recommend signing up for the patient portal called "MyChart".  Sign up information is provided on this After Visit Summary.  MyChart is used to connect with patients for Virtual Visits (Telemedicine).  Patients are able to view lab/test results, encounter notes, upcoming appointments, etc.  Non-urgent messages can be sent to your provider as well.   To learn more about what you can do with MyChart, go to [email or web address].    Your next appointment:   1 year(s)  The format for your next appointment:   In Person  Provider:   You may see Sinclair Grooms, MD or one of the following Advanced Practice Providers on your designated Care Team:    Kathyrn Drown, NP    Other Instructions Your cardiac CT will be scheduled at one of the below locations:   [facility] [address] [place], [place] [address] [phone]  [facility] [address] [place], [place] [address] [phone]  If scheduled at [facility], please arrive at the [facility] main entrance (entrance A) of [facility] 30 minutes prior to test start time. Proceed to the [facility] Radiology Department (first floor) to check-in and test prep.  If scheduled at [facility], please arrive 15 mins early for check-in and test prep.  Please follow these instructions carefully (unless otherwise directed):  Hold all erectile dysfunction medications at least 3 days (72 hrs) prior to test.  On the Night Before the Test: . Be sure to Drink plenty of water. . Do not consume any caffeinated/decaffeinated beverages or chocolate 12 hours prior to your test. . Do not take any antihistamines 12 hours prior to your test.   On the Day of the Test: . Drink plenty of water until 1 hour prior to the test. . Do not eat any food 4 hours prior to the test. . You may take your regular medications prior to the test.  . Take metoprolol (Lopressor) two hours prior to test. . HOLD Furosemide/Hydrochlorothiazide morning of the test. . FEMALES- please wear underwire-free bra if available       After the Test: . Drink plenty of water. . After receiving IV contrast, you may experience a mild flushed feeling. This is normal. . On occasion, you may experience a mild  rash up to 24 hours after the test. This is not dangerous. If this occurs, you can take Benadryl 25 mg and increase your fluid intake. . If you experience trouble breathing, this can be serious. If it is severe call 911 IMMEDIATELY. If it is mild, please call our office. . If you take any of these medications: Glipizide/Metformin, Avandament, Glucavance, please do not take 48 hours after completing test unless otherwise  instructed.   Once we have confirmed authorization from your insurance company, we will call you to set up a date and time for your test. Based on how quickly your insurance processes prior authorizations requests, please allow up to 4 weeks to be contacted for scheduling your Cardiac CT appointment. Be advised that routine Cardiac CT appointments could be scheduled as many as 8 weeks after your provider has ordered it.  For non-scheduling related questions, please contact the cardiac imaging nurse navigator should you have any questions/concerns: Marchia Bond, Cardiac Imaging Nurse Navigator Gordy Clement, Cardiac Imaging Nurse Navigator [facility] Direct Office Dial: [phone]   For scheduling needs, including cancellations and rescheduling, please call [place], [phone].

## 2021-01-12 DIAGNOSIS — M67819 Other specified disorders of synovium and tendon, unspecified shoulder: Secondary | ICD-10-CM | POA: Diagnosis not present

## 2021-01-12 DIAGNOSIS — G894 Chronic pain syndrome: Secondary | ICD-10-CM | POA: Diagnosis not present

## 2021-01-12 DIAGNOSIS — Z79899 Other long term (current) drug therapy: Secondary | ICD-10-CM | POA: Diagnosis not present

## 2021-01-12 DIAGNOSIS — Z79891 Long term (current) use of opiate analgesic: Secondary | ICD-10-CM | POA: Diagnosis not present

## 2021-01-12 DIAGNOSIS — M5136 Other intervertebral disc degeneration, lumbar region: Secondary | ICD-10-CM | POA: Diagnosis not present

## 2021-01-12 DIAGNOSIS — M2550 Pain in unspecified joint: Secondary | ICD-10-CM | POA: Diagnosis not present

## 2021-01-19 ENCOUNTER — Ambulatory Visit
Admission: RE | Admit: 2021-01-19 | Discharge: 2021-01-19 | Disposition: A | Discharge status: Home / Self Care | Payer: BC Managed Care – PPO | Source: Ambulatory Visit | Attending: Nurse Practitioner | Admitting: Nurse Practitioner

## 2021-01-19 DIAGNOSIS — K7400 Hepatic fibrosis, unspecified: Secondary | ICD-10-CM

## 2021-01-19 DIAGNOSIS — E039 Hypothyroidism, unspecified: Secondary | ICD-10-CM | POA: Diagnosis not present

## 2021-01-19 DIAGNOSIS — I10 Essential (primary) hypertension: Secondary | ICD-10-CM | POA: Diagnosis not present

## 2021-01-19 DIAGNOSIS — E7800 Pure hypercholesterolemia, unspecified: Secondary | ICD-10-CM | POA: Diagnosis not present

## 2021-01-19 DIAGNOSIS — R739 Hyperglycemia, unspecified: Secondary | ICD-10-CM | POA: Diagnosis not present

## 2021-01-19 DIAGNOSIS — Z1289 Encounter for screening for malignant neoplasm of other sites: Secondary | ICD-10-CM | POA: Diagnosis not present

## 2021-01-19 DIAGNOSIS — Z0000 Encounter for general adult medical examination without abnormal findings: Secondary | ICD-10-CM | POA: Diagnosis not present

## 2021-02-04 ENCOUNTER — Telehealth (HOSPITAL_COMMUNITY): Payer: Self-pay | Admitting: Emergency Medicine

## 2021-02-04 NOTE — Telephone Encounter (Signed)
Reaching out to patient to offer assistance regarding upcoming cardiac imaging study; pt verbalizes understanding of appt date/time, parking situation and where to check in, pre-test NPO status and medications ordered, and verified current allergies; name and call back number provided for further questions should they arise Marchia Bond RN Navigator Cardiac Imaging [facility] [phone] office [phone] cell  100mg  metoprolol tartrate 2 hr prior to scan , holding albuterol and losartan-HCTZ  Whitney Daniel

## 2021-02-07 ENCOUNTER — Ambulatory Visit (HOSPITAL_COMMUNITY)
Admission: RE | Admit: 2021-02-07 | Discharge: 2021-02-07 | Disposition: A | Discharge status: Home / Self Care | Payer: BC Managed Care – PPO | Source: Ambulatory Visit | Attending: Interventional Cardiology | Admitting: Interventional Cardiology

## 2021-02-07 ENCOUNTER — Encounter (HOSPITAL_COMMUNITY): Payer: Self-pay

## 2021-02-07 ENCOUNTER — Other Ambulatory Visit: Payer: Self-pay

## 2021-02-07 DIAGNOSIS — R072 Precordial pain: Secondary | ICD-10-CM | POA: Diagnosis not present

## 2021-02-07 MED ORDER — NITROGLYCERIN 0.4 MG SL SUBL
SUBLINGUAL_TABLET | SUBLINGUAL | Status: AC
  Administered 2021-02-07: 0.8 mg via SUBLINGUAL
  Filled 2021-02-07: qty 2

## 2021-02-07 MED ORDER — IOHEXOL 350 MG/ML SOLN
95.0000 mL | Freq: Once | INTRAVENOUS | Status: AC | PRN
  Administered 2021-02-07: 95 mL via INTRAVENOUS

## 2021-02-07 MED ORDER — NITROGLYCERIN 0.4 MG SL SUBL: 0.8000 mg | SUBLINGUAL_TABLET | Freq: Once | SUBLINGUAL | Status: AC

## 2021-02-16 DIAGNOSIS — M67819 Other specified disorders of synovium and tendon, unspecified shoulder: Secondary | ICD-10-CM | POA: Diagnosis not present

## 2021-02-16 DIAGNOSIS — M2550 Pain in unspecified joint: Secondary | ICD-10-CM | POA: Diagnosis not present

## 2021-02-16 DIAGNOSIS — M25579 Pain in unspecified ankle and joints of unspecified foot: Secondary | ICD-10-CM | POA: Diagnosis not present

## 2021-02-16 DIAGNOSIS — G894 Chronic pain syndrome: Secondary | ICD-10-CM | POA: Diagnosis not present

## 2021-03-09 ENCOUNTER — Other Ambulatory Visit: Payer: Self-pay | Admitting: Interventional Cardiology

## 2021-03-09 NOTE — Telephone Encounter (Signed)
Eliquis 5mg  refill request received. Patient is 65 years old, weight-97.1kg, Crea-0.94 on 01/10/2021, Diagnosis-Afib, and last seen by Dr. Tamala Julian on 01/10/2021. Dose is appropriate based on dosing criteria. Will send in refill to requested pharmacy.

## 2021-03-22 ENCOUNTER — Other Ambulatory Visit: Payer: Self-pay | Admitting: Interventional Cardiology

## 2021-04-01 ENCOUNTER — Other Ambulatory Visit: Payer: Self-pay

## 2021-04-01 ENCOUNTER — Other Ambulatory Visit: Payer: Self-pay | Admitting: Physician Assistant

## 2021-04-01 ENCOUNTER — Ambulatory Visit
Admission: RE | Admit: 2021-04-01 | Discharge: 2021-04-01 | Disposition: A | Discharge status: Home / Self Care | Payer: BC Managed Care – PPO | Source: Ambulatory Visit | Attending: Physician Assistant | Admitting: Physician Assistant

## 2021-04-01 DIAGNOSIS — M19071 Primary osteoarthritis, right ankle and foot: Secondary | ICD-10-CM | POA: Diagnosis not present

## 2021-04-01 DIAGNOSIS — M5136 Other intervertebral disc degeneration, lumbar region: Secondary | ICD-10-CM | POA: Diagnosis not present

## 2021-04-01 DIAGNOSIS — M19072 Primary osteoarthritis, left ankle and foot: Secondary | ICD-10-CM | POA: Diagnosis not present

## 2021-04-01 DIAGNOSIS — M67819 Other specified disorders of synovium and tendon, unspecified shoulder: Secondary | ICD-10-CM | POA: Diagnosis not present

## 2021-04-01 DIAGNOSIS — M25579 Pain in unspecified ankle and joints of unspecified foot: Secondary | ICD-10-CM

## 2021-04-01 DIAGNOSIS — M2550 Pain in unspecified joint: Secondary | ICD-10-CM | POA: Diagnosis not present

## 2021-04-01 DIAGNOSIS — G894 Chronic pain syndrome: Secondary | ICD-10-CM | POA: Diagnosis not present

## 2021-05-01 ENCOUNTER — Emergency Department (HOSPITAL_BASED_OUTPATIENT_CLINIC_OR_DEPARTMENT_OTHER)
Admission: EM | Admit: 2021-05-01 | Discharge: 2021-05-01 | Disposition: A | Discharge status: Home / Self Care | Payer: BC Managed Care – PPO | Attending: Emergency Medicine | Admitting: Emergency Medicine

## 2021-05-01 ENCOUNTER — Other Ambulatory Visit: Payer: Self-pay

## 2021-05-01 ENCOUNTER — Emergency Department (HOSPITAL_BASED_OUTPATIENT_CLINIC_OR_DEPARTMENT_OTHER): Payer: BC Managed Care – PPO

## 2021-05-01 ENCOUNTER — Encounter (HOSPITAL_BASED_OUTPATIENT_CLINIC_OR_DEPARTMENT_OTHER): Payer: Self-pay | Admitting: Emergency Medicine

## 2021-05-01 ENCOUNTER — Emergency Department (HOSPITAL_COMMUNITY): Payer: BC Managed Care – PPO

## 2021-05-01 DIAGNOSIS — Z85831 Personal history of malignant neoplasm of soft tissue: Secondary | ICD-10-CM | POA: Diagnosis not present

## 2021-05-01 DIAGNOSIS — E876 Hypokalemia: Secondary | ICD-10-CM

## 2021-05-01 DIAGNOSIS — I619 Nontraumatic intracerebral hemorrhage, unspecified: Secondary | ICD-10-CM | POA: Diagnosis not present

## 2021-05-01 DIAGNOSIS — Z20822 Contact with and (suspected) exposure to covid-19: Secondary | ICD-10-CM | POA: Insufficient documentation

## 2021-05-01 DIAGNOSIS — R531 Weakness: Secondary | ICD-10-CM | POA: Insufficient documentation

## 2021-05-01 DIAGNOSIS — R42 Dizziness and giddiness: Secondary | ICD-10-CM | POA: Diagnosis not present

## 2021-05-01 DIAGNOSIS — Z7901 Long term (current) use of anticoagulants: Secondary | ICD-10-CM | POA: Diagnosis not present

## 2021-05-01 DIAGNOSIS — I10 Essential (primary) hypertension: Secondary | ICD-10-CM | POA: Insufficient documentation

## 2021-05-01 DIAGNOSIS — Z955 Presence of coronary angioplasty implant and graft: Secondary | ICD-10-CM | POA: Insufficient documentation

## 2021-05-01 DIAGNOSIS — R200 Anesthesia of skin: Secondary | ICD-10-CM | POA: Diagnosis not present

## 2021-05-01 DIAGNOSIS — M5412 Radiculopathy, cervical region: Secondary | ICD-10-CM

## 2021-05-01 DIAGNOSIS — R202 Paresthesia of skin: Secondary | ICD-10-CM | POA: Insufficient documentation

## 2021-05-01 DIAGNOSIS — E039 Hypothyroidism, unspecified: Secondary | ICD-10-CM | POA: Insufficient documentation

## 2021-05-01 DIAGNOSIS — I4891 Unspecified atrial fibrillation: Secondary | ICD-10-CM | POA: Diagnosis not present

## 2021-05-01 DIAGNOSIS — I6782 Cerebral ischemia: Secondary | ICD-10-CM | POA: Diagnosis not present

## 2021-05-01 DIAGNOSIS — Z79899 Other long term (current) drug therapy: Secondary | ICD-10-CM | POA: Insufficient documentation

## 2021-05-01 DIAGNOSIS — R29818 Other symptoms and signs involving the nervous system: Secondary | ICD-10-CM | POA: Diagnosis not present

## 2021-05-01 LAB — CBC WITH DIFFERENTIAL/PLATELET
Abs Immature Granulocytes: 0.01 10*3/uL (ref 0.00–0.07)
Basophils Absolute: 0.1 10*3/uL (ref 0.0–0.1)
Basophils Relative: 2 %
Eosinophils Absolute: 0.2 10*3/uL (ref 0.0–0.5)
Eosinophils Relative: 4 %
HCT: 35.7 % — ABNORMAL LOW (ref 36.0–46.0)
Hemoglobin: 11.8 g/dL — ABNORMAL LOW (ref 12.0–15.0)
Immature Granulocytes: 0 %
Lymphocytes Relative: 26 %
Lymphs Abs: 1.5 10*3/uL (ref 0.7–4.0)
MCH: 28.6 pg (ref 26.0–34.0)
MCHC: 33.1 g/dL (ref 30.0–36.0)
MCV: 86.4 fL (ref 80.0–100.0)
Monocytes Absolute: 0.6 10*3/uL (ref 0.1–1.0)
Monocytes Relative: 11 %
Neutro Abs: 3.4 10*3/uL (ref 1.7–7.7)
Neutrophils Relative %: 57 %
Platelets: 254 10*3/uL (ref 150–400)
RBC: 4.13 MIL/uL (ref 3.87–5.11)
RDW: 14.3 % (ref 11.5–15.5)
WBC: 5.8 10*3/uL (ref 4.0–10.5)
nRBC: 0 % (ref 0.0–0.2)

## 2021-05-01 LAB — COMPREHENSIVE METABOLIC PANEL
ALT: 32 U/L (ref 0–44)
AST: 38 U/L (ref 15–41)
Albumin: 4 g/dL (ref 3.5–5.0)
Alkaline Phosphatase: 69 U/L (ref 38–126)
Anion gap: 9 (ref 5–15)
BUN: 13 mg/dL (ref 8–23)
CO2: 26 mmol/L (ref 22–32)
Calcium: 9.7 mg/dL (ref 8.9–10.3)
Chloride: 103 mmol/L (ref 98–111)
Creatinine, Ser: 0.82 mg/dL (ref 0.44–1.00)
GFR, Estimated: >60 mL/min (ref >60)
Glucose, Bld: 94 mg/dL (ref 70–99)
Potassium: 2.9 mmol/L — ABNORMAL LOW (ref 3.5–5.1)
Sodium: 138 mmol/L (ref 135–145)
Total Bilirubin: 0.2 mg/dL — ABNORMAL LOW (ref 0.3–1.2)
Total Protein: 7.4 g/dL (ref 6.5–8.1)

## 2021-05-01 LAB — RESP PANEL BY RT-PCR (FLU A&B, COVID) ARPGX2
Influenza A by PCR: NEGATIVE
Influenza B by PCR: NEGATIVE
SARS Coronavirus 2 by RT PCR: NEGATIVE

## 2021-05-01 LAB — TSH: TSH: 0.416 u[IU]/mL (ref 0.350–4.500)

## 2021-05-01 MED ORDER — POTASSIUM CHLORIDE 10 MEQ/100ML IV SOLN
10.0000 meq | Freq: Once | INTRAVENOUS | Status: DC
  Filled 2021-05-01: qty 100

## 2021-05-01 MED ORDER — POTASSIUM CHLORIDE CRYS ER 20 MEQ PO TBCR
20.0000 meq | EXTENDED_RELEASE_TABLET | Freq: Once | ORAL | Status: AC
  Administered 2021-05-01: 20 meq via ORAL
  Filled 2021-05-01: qty 1

## 2021-05-01 NOTE — ED Triage Notes (Signed)
States has been having numbness to right fingers x 4 days , that goes to right side of head

## 2021-05-01 NOTE — ED Notes (Signed)
Report given to Rennie Natter, charge nurse [facility]

## 2021-05-01 NOTE — ED Notes (Signed)
Pt reports feeling better now.  Answered questions for MRI.  Alert and oriented X4

## 2021-05-01 NOTE — ED Provider Notes (Signed)
[facility] Provider Note   CSN: [id number] Arrival date & time: 05/01/21  1552     History Chief Complaint  Patient presents with   Numbness    Whitney Daniel is a 65 y.o. 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  She reports that 4 days ago her right ring finger began to feel numb with tingling "shooting up my arm to my shoulder."  Over the past 2 days she has noted numbness and burning to the right middle and index fingers.  Reports that this sensation shoots up to her right lateral neck.  Also for the past 2 days she has noticed some weakness in her right arm and right leg.  It is difficult for her to grip objects.  Also reporting visual disturbance in her right eye that she describes as "maybe some pressure."  This has been going on for the same duration and as her numbness.  Also complaining of difficulty finding the correct words to use.  She reports that sometimes she forgets what she is trying to say.  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   Chest pain 04/15/2018   Hypertension 04/15/2018   Hypothyroidism 04/15/2018   Fibromyalgia syndrome 03/20/2017   Greater trochanteric bursitis of right hip 10/02/2016   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   Spondylosis of lumbar region without myelopathy or radiculopathy 11/11/2015   Right lumbar radiculitis 07/05/2015    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   Current tear knee, medial meniscus 04/30/2015   History of sarcoma of soft tissue 04/27/2015   Arthritis of knee, degenerative 03/05/2015   Paresthesias 10/27/2014   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, including hip ([place]) 07/10/2012    Past Surgical History:  Procedure Laterality Date   COLONOSCOPY     DILATATION & CURRETTAGE/HYSTEROSCOPY WITH RESECTOCOPE N/A 07/15/2014   Procedure: DILATATION & CURETTAGE/HYSTEROSCOPY WITH RESECTOCOPE;  Surgeon: Marvene Staff, MD;  Location: [facility];  Service: Gynecology;  Laterality: N/A;   DILATION AND CURETTAGE OF UTERUS     polyps   LEFT HEART CATH AND CORONARY ANGIOGRAPHY N/A 04/16/2018   Procedure: LEFT HEART CATH AND CORONARY ANGIOGRAPHY;  Surgeon: Belva Crome, MD;  Location: [facility];  Service: Cardiovascular;  Laterality: N/A;   right thigh surgery     cancer   WISDOM TOOTH EXTRACTION       OB History   No obstetric history on file.     Family History  Problem Relation Age of Onset   Hypertension Mother    CVA Mother    Cancer Father    Diabetes Father    Diabetes Sister    Heart attack Sister     Social History   Tobacco Use   Smoking status: Never   Smokeless tobacco: Never  Vaping Use  Vaping Use: Never used  Substance Use Topics   Alcohol use: Yes    Alcohol/week: 0.0 standard drinks    Comment: social - wine   Drug use: No    Home Medications Prior to Admission medications   Medication Sig Start Date End Date Taking? Authorizing Provider  albuterol (VENTOLIN HFA) 108 (90 Base) MCG/ACT inhaler 2 puffs as needed 01/09/14   [provider]  amLODipine (NORVASC) 5 MG tablet Take 1 tablet (5 mg total) by mouth daily. 09/22/20   Belva Crome, MD  apixaban (ELIQUIS) 5 MG TABS tablet TAKE 1 TABLET BY MOUTH TWICE A DAY 03/09/21   Belva Crome, MD  Ascorbic Acid (VITAMIN C) 500 MG CHEW Chew 500 mg by mouth daily.     [provider]  atorvastatin (LIPITOR) 80 MG tablet Take 1 tablet (80 mg total) by mouth daily. 10/14/20   Belva Crome, MD  B Complex-Biotin-FA (B-50 COMPLEX PO) 50 mg daily.    [provider]  Biotin 10000 MCG TABS Take 10,000 mcg by mouth daily.    [provider]  budesonide-formoterol (SYMBICORT) 160-4.5 MCG/ACT inhaler Inhale 2 puffs into the lungs daily as needed (shortness of breath or wheezing).     [provider]  Cholecalciferol (CVS D3) 50 MCG (2000 UT) CAPS Take 50 mcg by mouth daily.    [provider]  hydrOXYzine (ATARAX/VISTARIL) 25 MG tablet Take 25 mg by mouth as needed for irritation. 12/06/20   [provider]  isosorbide mononitrate (IMDUR) 30 MG 24 hr tablet TAKE 1/2 TABLETS (15 MG TOTAL) BY MOUTH ONCE DAILY. 03/22/21   Belva Crome, MD  levothyroxine (SYNTHROID, LEVOTHROID) 75 MCG tablet Take 75 mcg by mouth daily before breakfast.     [provider]  LINZESS 145 MCG CAPS capsule Take 145 mcg by mouth daily. 04/08/20   [provider]  losartan-hydrochlorothiazide (HYZAAR) 50-12.5 MG tablet Take 1 tablet by mouth daily. 10/18/18   [provider]  metoprolol tartrate (LOPRESSOR) 100 MG tablet Take 1 tablet by mouth 2 hours prior to CT. 01/10/21   Belva Crome, MD  Multiple Vitamin (MULTIVITAMIN WITH MINERALS) TABS Take 1 tablet by mouth daily.    [provider]  nitroGLYCERIN (NITROSTAT) 0.4 MG SL tablet TAKE 1 TABLET BY MOUTH EVERY 5 MINUTES AS NEEDED FOR CHEST PAIN 02/23/20   Belva Crome, MD  pregabalin (LYRICA) 75 MG capsule Take 75 mg by mouth 3 (three) times daily. 12/11/18   [provider]  traMADol (ULTRAM) 50 MG tablet Take 1 tablet (50 mg total) by mouth 2 (two) times daily as needed for moderate pain. 04/26/18   Kirsteins, Luanna Salk, MD    Allergies    Codeine and Vicodin [hydrocodone-acetaminophen]  Review of Systems   Review of Systems  Constitutional:   Positive for fatigue. Negative for chills and fever.  Eyes:  Positive for visual disturbance (Right eye "pressure").  Respiratory:  Negative for chest tightness and shortness of breath.   Cardiovascular:  Negative for chest pain.  Musculoskeletal:  Negative for back pain, myalgias, neck pain and neck stiffness.  Skin:  Negative for rash and wound.  Neurological:  Positive for weakness, light-headedness, numbness and headaches. Negative for seizures and syncope.  Psychiatric/Behavioral:  Positive for confusion and decreased concentration.   All other systems reviewed and are negative.  Physical Exam Updated Vital Signs BP 125/76   Pulse 64   Resp 13   Ht '5\' 8"'$  (  1.727 m)   Wt 92.7 kg   SpO2 100%   BMI 31.08 kg/m   Physical Exam Vitals and nursing note reviewed.  Constitutional:      General: She is not in acute distress.    Appearance: Normal appearance.  HENT:     Head: Normocephalic and atraumatic.     Right Ear: Tympanic membrane normal.     Left Ear: Tympanic membrane normal.  Eyes:     General: No scleral icterus.    Extraocular Movements: Extraocular movements intact.     Conjunctiva/sclera: Conjunctivae normal.     Pupils: Pupils are equal, round, and reactive to light.  Cardiovascular:     Rate and Rhythm: Normal rate and regular rhythm.  Pulmonary:     Effort: Pulmonary effort is normal. No respiratory distress.     Breath sounds: Normal breath sounds.  Abdominal:     General: Abdomen is flat.     Tenderness: There is no abdominal tenderness.  Musculoskeletal:        General: No swelling, deformity or signs of injury. Normal range of motion.     Cervical back: Normal range of motion and neck supple. No rigidity or tenderness (Nontender to neck palpation.  Unable to elicit tingling and burning with pressure to the head or neck.).  Lymphadenopathy:     Cervical: No cervical adenopathy.  Skin:    General: Skin is warm and dry.     Findings: No rash.   Neurological:     Mental Status: She is alert. She is disoriented.     Cranial Nerves: No cranial nerve deficit.     Motor: Weakness present.     Coordination: Coordination normal.     Comments: Patient with 4 out of 5 grip strength on the right side.  Unable to distinguish sharp from dull right extremity.  Able to feel light touch.  Point-to-point touch intact.  Positive Tinel's and positive Hoffmann signs on the right hand.  Overall slow speech.  Patient unable to maintain her train of thought.  At 1 point she stated "did I tell you about the pain in my head?"  We had discussed this discomfort in depth at this point during her evaluation.  Psychiatric:        Mood and Affect: Mood normal.        Behavior: Behavior normal.     Comments: Patient with occasional difficulty remembering words and train of thought.    ED Results / Procedures / Treatments   Labs (all labs ordered are listed, but only abnormal results are displayed) Labs Reviewed  COMPREHENSIVE METABOLIC PANEL - Abnormal; Notable for the following components:      Result Value   Potassium 2.9 (*)    Total Bilirubin 0.2 (*)    All other components within normal limits  CBC WITH DIFFERENTIAL/PLATELET - Abnormal; Notable for the following components:   Hemoglobin 11.8 (*)    HCT 35.7 (*)    All other components within normal limits  RESP PANEL BY RT-PCR (FLU A&B, COVID) ARPGX2  TSH    EKG EKG Interpretation  Date/Time:  Sunday May 01 2021 16:05:56 EDT Ventricular Rate:  62 PR Interval:  168 QRS Duration: 102 QT Interval:  429 QTC Calculation: 436 R Axis:   -71 Text Interpretation: Sinus rhythm LAD, consider left anterior fascicular block Abnormal R-wave progression, late transition when compared to prior, faster rate. No STEMI Confirmed by Antony Blackbird [phone]) on 05/01/2021 5:17:56 PM  Radiology CT HEAD  WO CONTRAST (5MM)  Result Date: 05/01/2021 CLINICAL DATA:  numbness to right fingers x 4 days , that goes  to right side of head. Neuro deficit. Acute stroke suspected. EXAM: CT HEAD WITHOUT CONTRAST TECHNIQUE: Contiguous axial images were obtained from the base of the skull through the vertex without intravenous contrast. COMPARISON:  CT head 11/13/2018 FINDINGS: Brain: No evidence of large-territorial acute infarction. No parenchymal hemorrhage. No mass lesion. No extra-axial collection. No mass effect or midline shift. No hydrocephalus. Basilar cisterns are patent. Vascular: No hyperdense vessel. Skull: No acute fracture or focal lesion. Sinuses/Orbits: Paranasal sinuses and mastoid air cells are clear. The orbits are unremarkable. Other: None. IMPRESSION: No acute intracranial abnormality. Electronically Signed   By: Iven Finn M.D.   On: 05/01/2021 17:13    Procedures Procedures   Medications Ordered in ED Medications - No data to display  ED Course  I have reviewed the triage vital signs and the nursing notes. Case discussed with MD Tegeler.  Pertinent labs & imaging results that were available during my care of the patient were reviewed by me and considered in my medical decision making (see chart for details).  Consulted neurologist Rory Percy due to neurolical symptoms. He recommended a head ct, lab work and ED to ED transport for a non-contrast MRI. Denied need for CT angio of head and neck due to the extended duration of patient's symptoms-would only operate if sx <2 days.  ED to ED transfer accepted by MD Schlossman.  MDM Rules/Calculators/A&P AUBREANA GOLDE is a 64 y.o. 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  She reports that 4 days ago her right ring finger began to feel numb with tingling "shooting up my arm to my shoulder."  Over the past 2 days she has noted numbness and burning to the right middle and index fingers.   Reports that this sensation shoots up to her right lateral neck.  Also for the past 2 days she has noticed some weakness in her right arm and right leg.  Patient also with difficulty finding her words at home and on my physical exam.  Because of her unilateral neuro logic symptoms I consulted neurologist Rory Percy.  He suggested that the patient had an ED to ED transport for a Noncon MRI for a stroke rule/out. He would like to be contacted after the scan.  Our head CT was without acute abnormality, no signs of masses.  Patient lab work unremarkable except for a potassium of 2.9.  This was replaced both orally and via IV prior to departure.  Final Clinical Impression(s) / ED Diagnoses Final diagnoses:  Numbness    Rx / DC Orders Patient was transferred to [facility].  Checked their notes to follow patient's care.    Darliss Ridgel 05/01/21 1939    Tegeler, Gwenyth Allegra, MD 05/01/21 2330

## 2021-05-01 NOTE — ED Provider Notes (Signed)
I assumed care of this patient.  Please see previous provider note for further details of Hx, PE.  Briefly patient is a 65 y.o. female who presented from [facility] for upper extremity numbness.   Here for an MRI to rule out stroke. MRI negative for acute stroke.  Etiology is likely radiculopathy either central versus peripheral.  Recommended close follow-up with PCP if symptoms do not resolve for MRI of the cervical spine.  The patient appears reasonably screened and/or stabilized for discharge and I doubt any other medical condition or other [facility] requiring further screening, evaluation, or treatment in the ED at this time prior to discharge. Safe for discharge with strict return precautions.  Disposition: Discharge  Condition: Good  I have discussed the results, Dx and Tx plan with the patient/family who expressed understanding and agree(s) with the plan. Discharge instructions discussed at length. The patient/family was given strict return precautions who verbalized understanding of the instructions. No further questions at time of discharge.    ED Discharge Orders     None        Follow Up: London Pepper, MD [address] [place] [address] [phone]  Call  to schedule an appointment for close follow up if symptoms do not resolve for MRI of the cervical spine to assess for stenosis          Whitney Daniel, Grayce Sessions, MD 05/01/21 2340

## 2021-06-07 DIAGNOSIS — M2550 Pain in unspecified joint: Secondary | ICD-10-CM | POA: Diagnosis not present

## 2021-06-07 DIAGNOSIS — M25579 Pain in unspecified ankle and joints of unspecified foot: Secondary | ICD-10-CM | POA: Diagnosis not present

## 2021-06-07 DIAGNOSIS — G894 Chronic pain syndrome: Secondary | ICD-10-CM | POA: Diagnosis not present

## 2021-06-07 DIAGNOSIS — G629 Polyneuropathy, unspecified: Secondary | ICD-10-CM | POA: Diagnosis not present

## 2021-06-22 DIAGNOSIS — Z79899 Other long term (current) drug therapy: Secondary | ICD-10-CM | POA: Diagnosis not present

## 2021-06-22 DIAGNOSIS — G894 Chronic pain syndrome: Secondary | ICD-10-CM | POA: Diagnosis not present

## 2021-06-22 DIAGNOSIS — Z79891 Long term (current) use of opiate analgesic: Secondary | ICD-10-CM | POA: Diagnosis not present

## 2021-06-29 ENCOUNTER — Other Ambulatory Visit: Payer: Self-pay | Admitting: Interventional Cardiology

## 2021-06-29 DIAGNOSIS — I25118 Atherosclerotic heart disease of native coronary artery with other forms of angina pectoris: Secondary | ICD-10-CM

## 2021-07-26 ENCOUNTER — Emergency Department (HOSPITAL_BASED_OUTPATIENT_CLINIC_OR_DEPARTMENT_OTHER)
Admission: EM | Admit: 2021-07-26 | Discharge: 2021-07-26 | Disposition: A | Discharge status: Home / Self Care | Payer: BC Managed Care – PPO | Attending: Emergency Medicine | Admitting: Emergency Medicine

## 2021-07-26 ENCOUNTER — Encounter (HOSPITAL_BASED_OUTPATIENT_CLINIC_OR_DEPARTMENT_OTHER): Payer: Self-pay | Admitting: Emergency Medicine

## 2021-07-26 ENCOUNTER — Emergency Department (HOSPITAL_BASED_OUTPATIENT_CLINIC_OR_DEPARTMENT_OTHER): Payer: BC Managed Care – PPO

## 2021-07-26 ENCOUNTER — Other Ambulatory Visit (HOSPITAL_BASED_OUTPATIENT_CLINIC_OR_DEPARTMENT_OTHER): Payer: Self-pay

## 2021-07-26 ENCOUNTER — Other Ambulatory Visit: Payer: Self-pay

## 2021-07-26 ENCOUNTER — Telehealth: Payer: Self-pay | Admitting: Interventional Cardiology

## 2021-07-26 DIAGNOSIS — Z7901 Long term (current) use of anticoagulants: Secondary | ICD-10-CM | POA: Insufficient documentation

## 2021-07-26 DIAGNOSIS — W228XXA Striking against or struck by other objects, initial encounter: Secondary | ICD-10-CM | POA: Diagnosis not present

## 2021-07-26 DIAGNOSIS — S0501XA Injury of conjunctiva and corneal abrasion without foreign body, right eye, initial encounter: Secondary | ICD-10-CM | POA: Insufficient documentation

## 2021-07-26 DIAGNOSIS — E039 Hypothyroidism, unspecified: Secondary | ICD-10-CM | POA: Insufficient documentation

## 2021-07-26 DIAGNOSIS — Z85831 Personal history of malignant neoplasm of soft tissue: Secondary | ICD-10-CM | POA: Diagnosis not present

## 2021-07-26 DIAGNOSIS — Y990 Civilian activity done for income or pay: Secondary | ICD-10-CM | POA: Insufficient documentation

## 2021-07-26 DIAGNOSIS — S0990XA Unspecified injury of head, initial encounter: Secondary | ICD-10-CM | POA: Insufficient documentation

## 2021-07-26 DIAGNOSIS — R42 Dizziness and giddiness: Secondary | ICD-10-CM | POA: Diagnosis not present

## 2021-07-26 DIAGNOSIS — I10 Essential (primary) hypertension: Secondary | ICD-10-CM | POA: Diagnosis not present

## 2021-07-26 DIAGNOSIS — Z79899 Other long term (current) drug therapy: Secondary | ICD-10-CM | POA: Insufficient documentation

## 2021-07-26 MED ORDER — FLUORESCEIN SODIUM 1 MG OP STRP
1.0000 | ORAL_STRIP | Freq: Once | OPHTHALMIC | Status: AC
  Administered 2021-07-26: 1 via OPHTHALMIC
  Filled 2021-07-26: qty 1

## 2021-07-26 MED ORDER — TETRACAINE HCL 0.5 % OP SOLN
2.0000 [drp] | Freq: Once | OPHTHALMIC | Status: AC
  Administered 2021-07-26: 2 [drp] via OPHTHALMIC
  Filled 2021-07-26: qty 4

## 2021-07-26 MED ORDER — CIPROFLOXACIN HCL 0.3 % OP OINT: TOPICAL_OINTMENT | OPHTHALMIC | 0 refills | Status: DC

## 2021-07-26 MED ORDER — CIPROFLOXACIN HCL 0.3 % OP SOLN
2.0000 [drp] | OPHTHALMIC | 0 refills | Status: DC
  Filled 2021-07-26: qty 5, 10d supply, fill #0

## 2021-07-26 NOTE — Discharge Instructions (Signed)
You do have a scratch of your eye from the eye injury.  I have given you antibiotic ointment to use.  I would recommend that you follow-up with your eye doctor in the next 3 to 4 days.  If you have any worsening symptoms return to the ER You have been diagnosed with a concussion.  Ibuprofen or Tylenol for pain Rest, ice on head.  Stay in a quiet, non-simulating, dark environment. No TV, computer use, video games until headache is resolved completely.  Follow up with your primary care physician if headache persists.  Return to the emergency department if patient becomes lethargic, begins vomiting or other change in mental status.  HEAD INJURY If any of the following occur notify your physician or go to the Hospital Emergency Department:  Increased drowsiness, stupor or loss of consciousness  Temperature above 100 F  Vomiting  Severe headache  Blood or clear fluid dripping from the nose or ears  Stiffness of the neck  Dizziness or blurred vision  Any other unusual symptoms  PRECAUTIONS  Keep head elevated at all times for the first 24 hours (Elevate mattress if pillow is ineffective)  Do not take sedatives, narcotics or alcohol  Avoid aspirin. Use only acetaminophen (e.g. Tylenol) or ibuprofen (e.g. Advil) for relief of pain. Follow directions on the bottle for dosage.  Use ice packs for comfort

## 2021-07-26 NOTE — Telephone Encounter (Signed)
Spoke with pt who states she hit the right side of her head very hard on a stop sign this morning.  She is on Eliquis.  She is complaining of current Headache that is spreading from the right to the left side of her head. She also complains of some lack of coordination but denies dizziness or blurred vision.   Pt advised with current symptoms she should be seen in the ED now for further evaluation.  Pt verbalizes understanding and agrees with current plan.

## 2021-07-26 NOTE — ED Provider Notes (Signed)
[facility] EMERGENCY DEPARTMENT Provider Note   CSN: [id number] Arrival date & time: 07/26/21  3149     History Chief Complaint  Patient presents with   Head Injury    Whitney Daniel is a 65 y.o. female.  HPI 65 year old female with a past medical history significant for hypertension, hypothyroidism, fibromyalgia, A. fib on anticoagulation presents to the emergency department today for evaluation of head injury.  Patient reports that she was at work this morning and stood up and hit her head on the bus sign.  Patient reports that she immediately felt dizzy, lightheaded and had a headache.  Patient reports that headache spread with the right side of her head to the left side of her head.  Denies any visual changes, photophobia, phonophobia, nausea or vomiting.  No loss of consciousness.  Denies any cervical spine neck pain.  Patient reports that she was evaluated at the facility and recommended she come to the ER for imaging given that she is on anticoagulation.  Of note patient also is asking about possible metal to her right eye.  Upon further questioning she reports that yesterday she was using the blower at work when some of the metal shavings got behind her safety goggles into her right eye.  Patient reports that she felt like she got this out however this morning she woke up and had some irritation to her right eyes she is unsure if she had another piece in there.  Patient reports wearing contacts.  Patient denies any visual changes.  Reports her last tetanus shot was in 2018.    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   Spondylosis of lumbar region without myelopathy or radiculopathy 11/11/2015   Right lumbar radiculitis 07/05/2015   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   Current tear knee, medial meniscus 04/30/2015   History of sarcoma of soft tissue 04/27/2015   Arthritis of knee, degenerative 03/05/2015   Paresthesias 10/27/2014   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, including hip ([place]) 07/10/2012    Past Surgical History:  Procedure Laterality Date   COLONOSCOPY     DILATATION & CURRETTAGE/HYSTEROSCOPY WITH RESECTOCOPE N/A 07/15/2014   Procedure: DILATATION & CURETTAGE/HYSTEROSCOPY WITH RESECTOCOPE;  Surgeon: Marvene Staff, MD;  Location: [facility];  Service: Gynecology;  Laterality: N/A;   DILATION AND CURETTAGE OF UTERUS     polyps   LEFT HEART CATH AND CORONARY ANGIOGRAPHY N/A 04/16/2018   Procedure: LEFT HEART CATH AND CORONARY ANGIOGRAPHY;  Surgeon: Belva Crome, MD;  Location: [facility];  Service: Cardiovascular;  Laterality: N/A;   right thigh surgery     cancer   WISDOM TOOTH EXTRACTION       OB History   No obstetric history on file.     Family History  Problem Relation Age of Onset   Hypertension Mother    CVA Mother  Cancer Father    Diabetes Father    Diabetes Sister    Heart attack Sister     Social History   Tobacco Use   Smoking status: Never   Smokeless tobacco: Never  Vaping Use   Vaping Use: Never used  Substance Use Topics   Alcohol use: Yes    Alcohol/week: 0.0 standard drinks    Comment: social - wine   Drug use: No    Home Medications Prior to Admission medications   Medication Sig Start Date End Date Taking? Authorizing Provider  albuterol (VENTOLIN HFA) 108 (90 Base) MCG/ACT inhaler 2 puffs as needed 01/09/14   [provider]  amLODipine (NORVASC) 5 MG tablet TAKE 1 TABLET (5 MG TOTAL) BY MOUTH DAILY. 06/29/21   Belva Crome, MD  apixaban (ELIQUIS) 5 MG TABS tablet TAKE 1 TABLET BY MOUTH TWICE A DAY 03/09/21   Belva Crome, MD  Ascorbic Acid (VITAMIN C) 500 MG CHEW Chew 500 mg by mouth daily.    [provider]  atorvastatin (LIPITOR) 80 MG tablet Take 1 tablet (80 mg total) by mouth daily. 10/14/20   Belva Crome, MD  B Complex-Biotin-FA (B-50 COMPLEX PO) 50 mg daily.    [provider]  Biotin 10000 MCG TABS Take 10,000 mcg by mouth daily.    [provider]  budesonide-formoterol (SYMBICORT) 160-4.5 MCG/ACT inhaler Inhale 2 puffs into the lungs daily as needed (shortness of breath or wheezing).     [provider]  Cholecalciferol (CVS D3) 50 MCG (2000 UT) CAPS Take 50 mcg by mouth daily.    [provider]  hydrOXYzine (ATARAX/VISTARIL) 25 MG tablet Take 25 mg by mouth as needed for irritation. 12/06/20   [provider]  isosorbide mononitrate (IMDUR) 30 MG 24 hr tablet TAKE 1/2 TABLETS (15 MG TOTAL) BY MOUTH ONCE DAILY. 03/22/21   Belva Crome, MD  levothyroxine (SYNTHROID, LEVOTHROID) 75 MCG tablet Take 75 mcg by mouth daily before breakfast.     [provider]  LINZESS 145 MCG CAPS capsule Take 145 mcg by mouth daily. 04/08/20   [provider]  losartan-hydrochlorothiazide (HYZAAR) 50-12.5 MG tablet Take 1 tablet by mouth daily. 10/18/18   [provider]  metoprolol tartrate (LOPRESSOR) 100 MG tablet Take 1 tablet by mouth 2 hours prior to CT. 01/10/21   Belva Crome, MD  Multiple Vitamin (MULTIVITAMIN WITH MINERALS) TABS Take 1 tablet by mouth daily.    [provider]  nitroGLYCERIN (NITROSTAT) 0.4 MG SL tablet TAKE 1 TABLET BY MOUTH EVERY 5 MINUTES AS NEEDED FOR CHEST PAIN 02/23/20   Belva Crome, MD  pregabalin (LYRICA) 75 MG capsule Take 75 mg by mouth 3 (three) times daily. 12/11/18   [provider]   traMADol (ULTRAM) 50 MG tablet Take 1 tablet (50 mg total) by mouth 2 (two) times daily as needed for moderate pain. 04/26/18   Kirsteins, Luanna Salk, MD    Allergies    Codeine and Vicodin [hydrocodone-acetaminophen]  Review of Systems   Review of Systems  Constitutional:  Negative for chills and fever.  HENT:  Negative for congestion.   Eyes:  Positive for redness. Negative for photophobia and visual disturbance.  Gastrointestinal:  Negative for nausea and vomiting.  Musculoskeletal:  Negative for neck pain.  Skin:  Negative for color change.  Neurological:  Positive for dizziness, light-headedness and headaches. Negative for syncope and weakness.  Psychiatric/Behavioral:  Negative for confusion.  Physical Exam Updated Vital Signs BP 120/72   Pulse 78   Temp 97.8 F (36.6 C) (Oral)   Resp 18   Ht 5\' 8"  (1.727 m)   Wt 86.2 kg   SpO2 94%   BMI 28.89 kg/m   Physical Exam Vitals and nursing note reviewed.  Constitutional:      General: She is not in acute distress.    Appearance: She is well-developed. She is not ill-appearing or toxic-appearing.  HENT:     Head: Normocephalic and atraumatic.     Comments: No hematoma or skull depression appreciated.  No bilateral hemotympanum.  No septal hematoma.    Right Ear: Tympanic membrane, ear canal and external ear normal.     Left Ear: Tympanic membrane, ear canal and external ear normal.     Nose: Nose normal.     Mouth/Throat:     Mouth: Mucous membranes are moist.     Pharynx: Oropharynx is clear.  Eyes:     General: No scleral icterus.       Right eye: No discharge.        Left eye: No discharge.     Extraocular Movements: Extraocular movements intact.     Pupils: Pupils are equal, round, and reactive to light.     Comments: Mild right injected conjunctiva.  Patient has no obvious foreign body.  Lids everted.  Pupils equal round reactive to light.  EOMs are normal.  Visual acuity is normal.  Patient does have small  subconjunctival abrasion over the 7 o'clock position.  There is no corneal abrasion noted.  Obvious foreign body appreciated negative Seidel sign.  Pulmonary:     Effort: No respiratory distress.  Musculoskeletal:        General: Normal range of motion.     Cervical back: Normal range of motion and neck supple. No tenderness.  Skin:    General: Skin is warm and dry.     Capillary Refill: Capillary refill takes less than 2 seconds.     Coloration: Skin is not pale.  Neurological:     Mental Status: She is alert and oriented to person, place, and time.     Comments: The patient is alert, attentive, and oriented x 3. Speech is clear. Cranial nerve II-VII grossly intact. Negative pronator drift. Sensation intact. Strength 5/5 in all extremities. Reflexes 2+ and symmetric at biceps, triceps, knees, and ankles. Rapid alternating movement and fine finger movements intact.    Psychiatric:        Behavior: Behavior normal.        Thought Content: Thought content normal.        Judgment: Judgment normal.    ED Results / Procedures / Treatments   Labs (all labs ordered are listed, but only abnormal results are displayed) Labs Reviewed - No data to display  EKG None  Radiology CT Head Wo Contrast  Result Date: 07/26/2021 CLINICAL DATA:  Head trauma EXAM: CT HEAD WITHOUT CONTRAST TECHNIQUE: Contiguous axial images were obtained from the base of the skull through the vertex without intravenous contrast. COMPARISON:  CT head and MRI head 05/01/2021. FINDINGS: Brain: No acute intracranial hemorrhage, mass effect, or herniation. No extra-axial fluid collections. No evidence of acute territorial infarct. No hydrocephalus. Patchy hypodensities in the periventricular and subcortical white matter, similar to previous studies. Vascular: No hyperdense vessel or unexpected calcification. Skull: Normal. Negative for fracture or focal lesion. Sinuses/Orbits: No acute finding. Other: None. IMPRESSION: Chronic  changes with no acute intracranial  process identified. Electronically Signed   By: Ofilia Neas M.D.   On: 07/26/2021 11:19   CT Maxillofacial Wo Contrast  Result Date: 07/26/2021 CLINICAL DATA:  Head trauma EXAM: CT MAXILLOFACIAL WITHOUT CONTRAST TECHNIQUE: Multidetector CT imaging of the maxillofacial structures was performed. Multiplanar CT image reconstructions were also generated. COMPARISON:  None. FINDINGS: Osseous: No fracture or mandibular dislocation. No destructive process. Orbits: Negative. No traumatic or inflammatory finding. Sinuses: Clear. Soft tissues: No significant soft tissue swelling visualized. Limited intracranial: No significant or unexpected finding. IMPRESSION: No acute fracture identified. Electronically Signed   By: Ofilia Neas M.D.   On: 07/26/2021 11:25    Procedures Procedures   Medications Ordered in ED Medications  tetracaine (PONTOCAINE) 0.5 % ophthalmic solution 2 drop (has no administration in time range)  fluorescein ophthalmic strip 1 strip (has no administration in time range)    ED Course  I have reviewed the triage vital signs and the nursing notes.  Pertinent labs & imaging results that were available during my care of the patient were reviewed by me and considered in my medical decision making (see chart for details).    MDM Rules/Calculators/A&P                           65 year old female presents the emergency department today for evaluation of right-sided head injury that this morning patient is on blood thinners.  Patient had no loss of consciousness.  No focal neurological deficit appreciated.  Patient is overall well-appearing.  Patient CT imaging today review shows no acute fracture or intracranial hemorrhage.  Discussed likely this is concussion-like symptoms.  Patient encouraged to continue symptomatic management at home.  She also reports injuring her right eye yesterday when some metal shavings blew into her eye after she was  using a blower.  Patient states that she felt she got it out but something still feel stuck in there.  On exam patient has no evidence of retained foreign body.  Fluorescein staining does reveal a conjunctival abrasion.  No evidence of globe rupture.  Patient will be treated with drops given that she does wear contacts I encouraged her to follow-up with her ophthalmologist.  Her tetanus vaccination is up-to-date.  Patient encouraged to return to the ER with any worsening symptoms.  Pt is hemodynamically stable, in NAD, & able to ambulate in the ED. Evaluation does not show pathology that would require ongoing emergent intervention or inpatient treatment. I explained the diagnosis to the patient. Pain has been managed & has no complaints prior to dc. Pt is comfortable with above plan and is stable for discharge at this time. All questions were answered prior to disposition. Strict return precautions for f/u to the ED were discussed. Encouraged follow up with PCP.  Final Clinical Impression(s) / ED Diagnoses Final diagnoses:  Injury of head, initial encounter  Abrasion of right conjunctiva, initial encounter    Rx / DC Orders ED Discharge Orders          Ordered    ciprofloxacin (CILOXAN) 0.3 % ophthalmic ointment        07/26/21 1131             Aaron Edelman 07/26/21 1133    Varney Biles, MD 07/26/21 1135

## 2021-07-26 NOTE — ED Triage Notes (Signed)
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

## 2021-07-26 NOTE — Telephone Encounter (Signed)
Spreading of headache from right to left Loss of balance and dizziness A little discomfort in chest A little SOB; patient sounds disoriented

## 2021-08-09 DIAGNOSIS — R200 Anesthesia of skin: Secondary | ICD-10-CM | POA: Diagnosis not present

## 2021-08-09 DIAGNOSIS — G5603 Carpal tunnel syndrome, bilateral upper limbs: Secondary | ICD-10-CM | POA: Diagnosis not present

## 2021-08-12 DIAGNOSIS — M791 Myalgia, unspecified site: Secondary | ICD-10-CM | POA: Diagnosis not present

## 2021-08-12 DIAGNOSIS — S060X0D Concussion without loss of consciousness, subsequent encounter: Secondary | ICD-10-CM | POA: Diagnosis not present

## 2021-08-12 DIAGNOSIS — R634 Abnormal weight loss: Secondary | ICD-10-CM | POA: Diagnosis not present

## 2021-08-19 ENCOUNTER — Telehealth: Payer: Self-pay | Admitting: Interventional Cardiology

## 2021-08-19 DIAGNOSIS — E785 Hyperlipidemia, unspecified: Secondary | ICD-10-CM

## 2021-08-19 NOTE — Telephone Encounter (Signed)
Pt c/o medication issue:  1. Name of Medication: atorvastatin (LIPITOR) 80 MG tablet  2. How are you currently taking this medication (dosage and times per day)? Take 1 tablet (80 mg total) by mouth daily.  3. Are you having a reaction (difficulty breathing--STAT)? no  4. What is your medication issue? Patient is having muscle pain and bone pain.  Having trouble walking in the morning.

## 2021-08-19 NOTE — Telephone Encounter (Signed)
Called patient back about her message. Patient stated that she has been having muscle and bone pain for some time now and was told by her other providers that it could be her atorvastatin. Informed patient that she could try not taking the medication for 2 weeks and see if this pain resolves. Informed patient if it is her atorvastatin that we can get her in to see the lipid clinic to help find something for her cholesterol that would not having these side effects. Will  forward to Dr. Tamala Julian for his advisement.  Patient also wanted Dr. Tamala Julian to know on 08/12/21 CPK was 420.

## 2021-08-19 NOTE — Telephone Encounter (Signed)
Left message for patient to call back. Per Dr. Tamala Julian, stop lipitor and enroll in lipid clinic. Placed order for referral and took lipitor off patient's list.

## 2021-08-23 NOTE — Telephone Encounter (Signed)
Pt is scheduled with Pharmacy already.  Called pt to make sure she was aware of appt and to stop Atorvastatin.  Pt states she is.  Pt appreciative for call.

## 2021-09-08 DIAGNOSIS — M67819 Other specified disorders of synovium and tendon, unspecified shoulder: Secondary | ICD-10-CM | POA: Diagnosis not present

## 2021-09-08 DIAGNOSIS — M5136 Other intervertebral disc degeneration, lumbar region: Secondary | ICD-10-CM | POA: Diagnosis not present

## 2021-09-08 DIAGNOSIS — M47817 Spondylosis without myelopathy or radiculopathy, lumbosacral region: Secondary | ICD-10-CM | POA: Diagnosis not present

## 2021-09-08 DIAGNOSIS — M24152 Other articular cartilage disorders, left hip: Secondary | ICD-10-CM | POA: Diagnosis not present

## 2021-09-12 DIAGNOSIS — I10 Essential (primary) hypertension: Secondary | ICD-10-CM | POA: Diagnosis not present

## 2021-09-12 DIAGNOSIS — G8929 Other chronic pain: Secondary | ICD-10-CM | POA: Diagnosis not present

## 2021-09-12 DIAGNOSIS — E7800 Pure hypercholesterolemia, unspecified: Secondary | ICD-10-CM | POA: Diagnosis not present

## 2021-09-12 DIAGNOSIS — E039 Hypothyroidism, unspecified: Secondary | ICD-10-CM | POA: Diagnosis not present

## 2021-09-12 NOTE — Progress Notes (Signed)
Patient ID: NYKOLE MATOS                 DOB: 1956/06/25                    MRN: [id number]     HPI: ORABELLE RYLEE is a 66 y.o. female patient referred to lipid clinic by Dr. Tamala Julian. PMH is significant for afib on chronic anticoagulation, CAD, HTN, hypothyroidism, exertional chest tightness and dyspnea. Cath 2019 showed 40-50% oRCA stenosis and 70% LAD after the third diagonal branch. Coronary CTA 02/2021 showed calcium score of 4.96 which was the 69th percentile and age, sex, and race matched controls; also showed moderate (50-69%) distal to apical LAD stenosis. Last seen by Dr. Tamala Julian in 01/2021. Patient called in 08/2021 complaining of muscle and bone pain and having trouble walking in the morning. CPK was elevated to 420 and atorvastatin was discontinued.   Today, patient arrives in good spirits. Reports muscle pain and bone pain on atorvastatin and her PCP checked her CPK level. She had been on atorvastatin since 2019. Her pain is improving off of atorvastatin and is able to move around much more easily. She is working now and has a Public librarian.   Current Medications: none Intolerances: atorvastatin - elevated CK Risk Factors: CAD, Fhx ASCVD, HTN, elevated lipoprotein(a) LDL goal: <70 mg/dL  Diet: avoiding foods high in fat like butter but is trying to focus on improving diet  Exercise: active at work  Family History: The patient's family history includes CVA in her mother; Cancer in her father; Diabetes in her father and sister; Heart attack in her sister; Hypertension in her mother.  Social History: Never smoker.   Labs: 08/12/21: CK 420 (atorvastatin 80 mg) 01/19/21: TC 119, TG 49, HDL 56, LDL 51 (atorvastatin 80 mg) 04/16/18: TC 206, TG 81, HDL 46, LDL 144 (no lipid lowering therapy)  Past Medical History:  Diagnosis Date   A-fib ([facility])    Anemia    Cancer ([facility])    Fibromyalgia    Hypertension    Hypothyroidism    Osteoarthritis    SVD (spontaneous vaginal  delivery)    x 5    Current Outpatient Medications on File Prior to Visit  Medication Sig Dispense Refill   albuterol (VENTOLIN HFA) 108 (90 Base) MCG/ACT inhaler 2 puffs as needed     amLODipine (NORVASC) 5 MG tablet TAKE 1 TABLET (5 MG TOTAL) BY MOUTH DAILY. 90 tablet 2   apixaban (ELIQUIS) 5 MG TABS tablet TAKE 1 TABLET BY MOUTH TWICE A DAY 60 tablet 10   Ascorbic Acid (VITAMIN C) 500 MG CHEW Chew 500 mg by mouth daily.     B Complex-Biotin-FA (B-50 COMPLEX PO) 50 mg daily.     Biotin 10000 MCG TABS Take 10,000 mcg by mouth daily.     budesonide-formoterol (SYMBICORT) 160-4.5 MCG/ACT inhaler Inhale 2 puffs into the lungs daily as needed (shortness of breath or wheezing).      Cholecalciferol (CVS D3) 50 MCG (2000 UT) CAPS Take 50 mcg by mouth daily.     ciprofloxacin (CILOXAN) 0.3 % ophthalmic solution Place 2 drops into the right eye every 4 (four) hours while awake. Place 1 drop in the eye every 2 hours while awake for 2 days, then 1 drop every 4 hours while awake for 5 days. 5 mL 0   hydrOXYzine (ATARAX/VISTARIL) 25 MG tablet Take 25 mg by mouth as needed for irritation.  isosorbide mononitrate (IMDUR) 30 MG 24 hr tablet TAKE 1/2 TABLETS (15 MG TOTAL) BY MOUTH ONCE DAILY. 45 tablet 3   levothyroxine (SYNTHROID, LEVOTHROID) 75 MCG tablet Take 75 mcg by mouth daily before breakfast.      LINZESS 145 MCG CAPS capsule Take 145 mcg by mouth daily.     losartan-hydrochlorothiazide (HYZAAR) 50-12.5 MG tablet Take 1 tablet by mouth daily.     metoprolol tartrate (LOPRESSOR) 100 MG tablet Take 1 tablet by mouth 2 hours prior to CT. 1 tablet 0   Multiple Vitamin (MULTIVITAMIN WITH MINERALS) TABS Take 1 tablet by mouth daily.     nitroGLYCERIN (NITROSTAT) 0.4 MG SL tablet TAKE 1 TABLET BY MOUTH EVERY 5 MINUTES AS NEEDED FOR CHEST PAIN 25 tablet 5   pregabalin (LYRICA) 75 MG capsule Take 75 mg by mouth 3 (three) times daily.     traMADol (ULTRAM) 50 MG tablet Take 1 tablet (50 mg total) by mouth  2 (two) times daily as needed for moderate pain. 60 tablet 5   No current facility-administered medications on file prior to visit.    Allergies  Allergen Reactions   Codeine Other (See Comments)    Patient states "she did not like the way codeine made her feel"     Vicodin [Hydrocodone-Acetaminophen] Nausea And Vomiting    Doesn't like how it makes her feel but can take if needs to for pain    Assessment/Plan:  1. Hyperlipidemia - LDL previously at goal <70 mg/dL on atorvastatin 80 mg daily now with myalgia and CPK elevation. She has stopped atorvastatin and symptoms are improving. Will recheck CPK today. Will also check advanced lipid panel given mention per chart review of previous elevated lipoprotein(a) but cannot see the value in Epic. Discuss retrial of statin versus starting PCSK9i treatment. She would like to try PCSK9i if her insurance covers this. 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 I tried submitting a PA for Repatha but it said no PA needed because it is already covered. I called her pharmacy to make sure it went through and it did, cost is $70. I signed her up for a copay card to bring the cost to $5 per 30 day supply (RxBin: [id number], [place]: CN, RxGrp: [id number], ID: [id number]). I called her with this update and she will start [place] when she gets it from her pharmacy. She is scheduled for follow up labs in March.   Rebbeca Paul, PharmD PGY2 Ambulatory Care Pharmacy Resident 09/13/2021 4:26 PM

## 2021-09-13 ENCOUNTER — Encounter: Payer: Self-pay | Admitting: Student-PharmD

## 2021-09-13 ENCOUNTER — Ambulatory Visit (INDEPENDENT_AMBULATORY_CARE_PROVIDER_SITE_OTHER): Payer: BC Managed Care – PPO | Admitting: Student-PharmD

## 2021-09-13 ENCOUNTER — Other Ambulatory Visit: Payer: Self-pay

## 2021-09-13 DIAGNOSIS — E782 Mixed hyperlipidemia: Secondary | ICD-10-CM

## 2021-09-13 DIAGNOSIS — E785 Hyperlipidemia, unspecified: Secondary | ICD-10-CM | POA: Diagnosis not present

## 2021-09-13 MED ORDER — REPATHA SURECLICK 140 MG/ML ~~LOC~~ SOAJ: 140.0000 mg | SUBCUTANEOUS | 11 refills | Status: DC

## 2021-09-13 NOTE — Patient Instructions (Addendum)
Nice to see you today!  Keep up the good work with diet and exercise. Aim for a diet full of vegetables, fruit and lean meats (chicken, [place], fish). Try to limit carbs (bread, pasta, sugar, rice) and red meat consumption.  Your goal LDL is less than 70 mg/dL.   Medication Changes: I will submit a prior authorization for either Repatha or Praluent depending on which one your insurance prefers. I will give you a call with the result and with your lab results. We'll plan for follow up labs when we talk on the phone.   Please give [place] a call at [phone] with any questions or concerns.  For Repatha or Praluent, inject once every other week (any day of the week that works for you) into the fatty skin of stomach, upper outer thigh or back of the arm. Clean the site with soap and warm water or an alcohol pad. Keep the medication in the fridge until you are ready to give your dose, then take it out and let warm up to room temperature for 30-60 mins.

## 2021-09-14 LAB — COMPREHENSIVE METABOLIC PANEL
ALT: 13 IU/L (ref 0–32)
AST: 20 IU/L (ref 0–40)
Albumin/Globulin Ratio: 1.7 (ref 1.2–2.2)
Albumin: 4.5 g/dL (ref 3.8–4.8)
Alkaline Phosphatase: 102 IU/L (ref 44–121)
BUN/Creatinine Ratio: 11 — ABNORMAL LOW (ref 12–28)
BUN: 9 mg/dL (ref 8–27)
Bilirubin Total: 0.3 mg/dL (ref 0.0–1.2)
CO2: 25 mmol/L (ref 20–29)
Calcium: 10.8 mg/dL — ABNORMAL HIGH (ref 8.7–10.3)
Chloride: 102 mmol/L (ref 96–106)
Creatinine, Ser: 0.8 mg/dL (ref 0.57–1.00)
Globulin, Total: 2.7 g/dL (ref 1.5–4.5)
Glucose: 89 mg/dL (ref 70–99)
Potassium: 4 mmol/L (ref 3.5–5.2)
Sodium: 141 mmol/L (ref 134–144)
Total Protein: 7.2 g/dL (ref 6.0–8.5)
eGFR: 83 mL/min/{1.73_m2} (ref >59)

## 2021-09-14 LAB — NMR, LIPOPROFILE
Cholesterol, Total: 203 mg/dL — ABNORMAL HIGH (ref 100–199)
HDL Particle Number: 31.6 umol/L (ref >=30.5)
HDL-C: 70 mg/dL (ref >39)
LDL Particle Number: 1134 nmol/L — ABNORMAL HIGH (ref <1000)
LDL Size: 22 nm (ref >20.5)
LDL-C (NIH Calc): 121 mg/dL — ABNORMAL HIGH (ref 0–99)
LP-IR Score: <25 (ref <=45)
Small LDL Particle Number: 141 nmol/L (ref <=527)
Triglycerides: 65 mg/dL (ref 0–149)

## 2021-09-14 LAB — APOLIPOPROTEIN B: Apolipoprotein B: 88 mg/dL (ref <90)

## 2021-09-14 LAB — LIPOPROTEIN A (LPA): Lipoprotein (a): 26.2 nmol/L (ref <75.0)

## 2021-09-14 LAB — CK: Total CK: 194 U/L — ABNORMAL HIGH (ref 32–182)

## 2021-09-20 DIAGNOSIS — L989 Disorder of the skin and subcutaneous tissue, unspecified: Secondary | ICD-10-CM | POA: Diagnosis not present

## 2021-09-20 DIAGNOSIS — Z01419 Encounter for gynecological examination (general) (routine) without abnormal findings: Secondary | ICD-10-CM | POA: Diagnosis not present

## 2021-09-20 DIAGNOSIS — Z808 Family history of malignant neoplasm of other organs or systems: Secondary | ICD-10-CM | POA: Diagnosis not present

## 2021-09-20 DIAGNOSIS — Z780 Asymptomatic menopausal state: Secondary | ICD-10-CM | POA: Diagnosis not present

## 2021-09-21 ENCOUNTER — Telehealth: Payer: Self-pay | Admitting: Interventional Cardiology

## 2021-09-21 NOTE — Telephone Encounter (Signed)
° °  Pt c/o medication issue:  1. Name of Medication:  Evolocumab (REPATHA SURECLICK) 150 MG/ML SOAJ  2. How are you currently taking this medication (dosage and times per day)? Inject 140 mg into the skin every 14 (fourteen) days.  3. Are you having a reaction (difficulty breathing--STAT)?   4. What is your medication issue? Pt said she has question about repatha and requesting to speak with pharmacist

## 2021-09-22 NOTE — Telephone Encounter (Signed)
Called patient back but unable to reach, LVM requesting call back.

## 2021-09-23 DIAGNOSIS — Z1231 Encounter for screening mammogram for malignant neoplasm of breast: Secondary | ICD-10-CM | POA: Diagnosis not present

## 2021-09-27 NOTE — Telephone Encounter (Signed)
Spoke with patient who said she was having trouble activating the copay card at her pharmacy but they were able to run it through and it is going to be $5. She has no further questions at this time.

## 2021-10-05 DIAGNOSIS — M25631 Stiffness of right wrist, not elsewhere classified: Secondary | ICD-10-CM | POA: Diagnosis not present

## 2021-10-05 DIAGNOSIS — M6281 Muscle weakness (generalized): Secondary | ICD-10-CM | POA: Diagnosis not present

## 2021-10-05 DIAGNOSIS — M25632 Stiffness of left wrist, not elsewhere classified: Secondary | ICD-10-CM | POA: Diagnosis not present

## 2021-10-05 DIAGNOSIS — M25531 Pain in right wrist: Secondary | ICD-10-CM | POA: Diagnosis not present

## 2021-10-05 DIAGNOSIS — M25532 Pain in left wrist: Secondary | ICD-10-CM | POA: Diagnosis not present

## 2021-10-27 DIAGNOSIS — N6001 Solitary cyst of right breast: Secondary | ICD-10-CM | POA: Diagnosis not present

## 2021-10-27 DIAGNOSIS — R928 Other abnormal and inconclusive findings on diagnostic imaging of breast: Secondary | ICD-10-CM | POA: Diagnosis not present

## 2021-11-10 DIAGNOSIS — G894 Chronic pain syndrome: Secondary | ICD-10-CM | POA: Diagnosis not present

## 2021-11-10 DIAGNOSIS — G5600 Carpal tunnel syndrome, unspecified upper limb: Secondary | ICD-10-CM | POA: Diagnosis not present

## 2021-11-10 DIAGNOSIS — Z79891 Long term (current) use of opiate analgesic: Secondary | ICD-10-CM | POA: Diagnosis not present

## 2021-11-10 DIAGNOSIS — Z79899 Other long term (current) drug therapy: Secondary | ICD-10-CM | POA: Diagnosis not present

## 2021-11-10 DIAGNOSIS — M170 Bilateral primary osteoarthritis of knee: Secondary | ICD-10-CM | POA: Diagnosis not present

## 2021-11-10 DIAGNOSIS — M47816 Spondylosis without myelopathy or radiculopathy, lumbar region: Secondary | ICD-10-CM | POA: Diagnosis not present

## 2021-11-10 DIAGNOSIS — G629 Polyneuropathy, unspecified: Secondary | ICD-10-CM | POA: Diagnosis not present

## 2021-11-11 ENCOUNTER — Other Ambulatory Visit: Payer: Self-pay

## 2021-11-11 ENCOUNTER — Ambulatory Visit
Admission: RE | Admit: 2021-11-11 | Discharge: 2021-11-11 | Disposition: A | Discharge status: Home / Self Care | Payer: Medicare HMO | Source: Ambulatory Visit | Attending: Physician Assistant | Admitting: Physician Assistant

## 2021-11-11 ENCOUNTER — Other Ambulatory Visit: Payer: Self-pay | Admitting: Physician Assistant

## 2021-11-11 DIAGNOSIS — M25571 Pain in right ankle and joints of right foot: Secondary | ICD-10-CM

## 2021-11-11 DIAGNOSIS — M7989 Other specified soft tissue disorders: Secondary | ICD-10-CM | POA: Diagnosis not present

## 2021-11-11 DIAGNOSIS — M25572 Pain in left ankle and joints of left foot: Secondary | ICD-10-CM | POA: Diagnosis not present

## 2021-11-14 ENCOUNTER — Other Ambulatory Visit: Payer: Medicare HMO

## 2021-11-14 ENCOUNTER — Other Ambulatory Visit: Payer: Self-pay

## 2021-11-14 DIAGNOSIS — E785 Hyperlipidemia, unspecified: Secondary | ICD-10-CM | POA: Diagnosis not present

## 2021-11-15 ENCOUNTER — Telehealth: Payer: Self-pay | Admitting: Student-PharmD

## 2021-11-15 DIAGNOSIS — E785 Hyperlipidemia, unspecified: Secondary | ICD-10-CM

## 2021-11-15 LAB — LIPID PANEL
Chol/HDL Ratio: 3.4 ratio (ref 0.0–4.4)
Cholesterol, Total: 213 mg/dL — ABNORMAL HIGH (ref 100–199)
HDL: 63 mg/dL (ref >39)
LDL Chol Calc (NIH): 137 mg/dL — ABNORMAL HIGH (ref 0–99)
Triglycerides: 74 mg/dL (ref 0–149)
VLDL Cholesterol Cal: 13 mg/dL (ref 5–40)

## 2021-11-15 LAB — HEPATIC FUNCTION PANEL
ALT: 17 IU/L (ref 0–32)
AST: 24 IU/L (ref 0–40)
Albumin: 4.6 g/dL (ref 3.8–4.8)
Alkaline Phosphatase: 92 IU/L (ref 44–121)
Bilirubin Total: 0.3 mg/dL (ref 0.0–1.2)
Bilirubin, Direct: 0.1 mg/dL (ref 0.00–0.40)
Total Protein: 7.3 g/dL (ref 6.0–8.5)

## 2021-11-15 NOTE — Telephone Encounter (Signed)
LDL is 137 (goal <70 mg/dL) and is higher than it was when last checked (LDL was 121 on 09/13/21) when Repatha was started. Suspect she is not taking Repatha.  ? ?Called patient to confirm whether she is taking it but was unable to reach. LVM requesting call back.  ?

## 2021-11-16 NOTE — Telephone Encounter (Signed)
Pt returned call to clinic. States she has not been taking her Repatha because she does not want to give herself an injection. ? ?She asks where injections would be given and if the Repatha works differently that her statin. Reviewed this again, was previously discussed at her lipid visit in January. ? ?She states she will start her Repatha. Follow up labs scheduled again in 2 months. ?

## 2021-11-18 ENCOUNTER — Other Ambulatory Visit: Payer: Self-pay | Admitting: Pharmacist

## 2021-11-18 MED ORDER — APIXABAN 5 MG PO TABS: 5.0000 mg | ORAL_TABLET | Freq: Two times a day (BID) | ORAL | 1 refills | Status: DC

## 2021-12-06 DIAGNOSIS — M25561 Pain in right knee: Secondary | ICD-10-CM | POA: Diagnosis not present

## 2021-12-06 DIAGNOSIS — G894 Chronic pain syndrome: Secondary | ICD-10-CM | POA: Diagnosis not present

## 2021-12-06 DIAGNOSIS — M25562 Pain in left knee: Secondary | ICD-10-CM | POA: Diagnosis not present

## 2021-12-12 ENCOUNTER — Telehealth: Payer: Self-pay | Admitting: Interventional Cardiology

## 2021-12-12 ENCOUNTER — Encounter: Payer: Self-pay | Admitting: Cardiology

## 2021-12-12 NOTE — Telephone Encounter (Signed)
Returned call to pt. Gave her first Repatha injection without issue. Gave her 2nd Repatha dose in her abdomen, had some bruising around the injection site (also takes Eliquis), then felt a hard knot in the middle that started swelling. States it has been growing in size over the past week, can't quantify the size though. ? ?Sounds like she hit a blood vessel when she gave her injection that has potentially turned into a small hematoma. Advised her to keep an eye on it and have PCP evaluate it if it continues to grow in size. Otherwise if it stabilizes and starts to shrink, should resolve on its own. Encouraged her to give next injection in her upper outer thigh instead. ?

## 2021-12-12 NOTE — Telephone Encounter (Signed)
Pt c/o medication issue: ? ?1. Name of Medication: REpatha  ? ?2. How are you currently taking this medication (dosage and times per day)? Once every 2 weeks  ? ?3. Are you having a reaction (difficulty breathing--STAT)? Na  ? ?4. What is your medication issue? Pt stated she took this med last Monday and she now has a knot around the or under the injection site and she feel like it has gotten bigger  ? ?Best number [phone]  ?

## 2021-12-12 NOTE — Telephone Encounter (Signed)
error 

## 2021-12-28 DIAGNOSIS — M25562 Pain in left knee: Secondary | ICD-10-CM | POA: Diagnosis not present

## 2021-12-28 DIAGNOSIS — Z79891 Long term (current) use of opiate analgesic: Secondary | ICD-10-CM | POA: Diagnosis not present

## 2021-12-28 DIAGNOSIS — G894 Chronic pain syndrome: Secondary | ICD-10-CM | POA: Diagnosis not present

## 2021-12-28 DIAGNOSIS — M25561 Pain in right knee: Secondary | ICD-10-CM | POA: Diagnosis not present

## 2021-12-28 DIAGNOSIS — M25572 Pain in left ankle and joints of left foot: Secondary | ICD-10-CM | POA: Diagnosis not present

## 2021-12-28 DIAGNOSIS — M25571 Pain in right ankle and joints of right foot: Secondary | ICD-10-CM | POA: Diagnosis not present

## 2022-01-09 ENCOUNTER — Other Ambulatory Visit: Payer: Medicare HMO

## 2022-01-09 ENCOUNTER — Other Ambulatory Visit: Payer: Self-pay

## 2022-01-09 ENCOUNTER — Other Ambulatory Visit: Payer: Self-pay | Admitting: Interventional Cardiology

## 2022-01-09 DIAGNOSIS — E785 Hyperlipidemia, unspecified: Secondary | ICD-10-CM

## 2022-01-09 NOTE — Progress Notes (Signed)
Lipids, LFT ordered and will be released to [place]. ?

## 2022-01-17 ENCOUNTER — Telehealth: Payer: Self-pay | Admitting: Student-PharmD

## 2022-01-17 NOTE — Telephone Encounter (Signed)
Patient was supposed to have labs drawn last week on 01/09/22 in the office but was unaware she was supposed to be fasting when she arrived for the lab visit. Labs were then ordered for [facility] so she could have these done at her convenience however she did not have these drawn last week. Called patient to remind her to have these drawn as soon as she is able. Unable to reach, LVM.   ?

## 2022-01-24 NOTE — Telephone Encounter (Signed)
Called patient to remind her to have her labs drawn. Third week in a row trying to reach her to remind her of this. Will not attempt further calls, will await her return call at this time.

## 2022-01-26 DIAGNOSIS — M47816 Spondylosis without myelopathy or radiculopathy, lumbar region: Secondary | ICD-10-CM | POA: Diagnosis not present

## 2022-01-26 DIAGNOSIS — M542 Cervicalgia: Secondary | ICD-10-CM | POA: Diagnosis not present

## 2022-01-26 DIAGNOSIS — M25572 Pain in left ankle and joints of left foot: Secondary | ICD-10-CM | POA: Diagnosis not present

## 2022-01-26 DIAGNOSIS — M25571 Pain in right ankle and joints of right foot: Secondary | ICD-10-CM | POA: Diagnosis not present

## 2022-01-26 DIAGNOSIS — Z79891 Long term (current) use of opiate analgesic: Secondary | ICD-10-CM | POA: Diagnosis not present

## 2022-01-26 DIAGNOSIS — G894 Chronic pain syndrome: Secondary | ICD-10-CM | POA: Diagnosis not present

## 2022-02-01 DIAGNOSIS — E785 Hyperlipidemia, unspecified: Secondary | ICD-10-CM | POA: Diagnosis not present

## 2022-02-02 LAB — HEPATIC FUNCTION PANEL
ALT: 17 IU/L (ref 0–32)
AST: 23 IU/L (ref 0–40)
Albumin: 4.4 g/dL (ref 3.8–4.8)
Alkaline Phosphatase: 96 IU/L (ref 44–121)
Bilirubin Total: 0.3 mg/dL (ref 0.0–1.2)
Bilirubin, Direct: <0.1 mg/dL (ref 0.00–0.40)
Total Protein: 7 g/dL (ref 6.0–8.5)

## 2022-02-02 LAB — LIPID PANEL
Chol/HDL Ratio: 2.4 ratio (ref 0.0–4.4)
Cholesterol, Total: 148 mg/dL (ref 100–199)
HDL: 62 mg/dL (ref >39)
LDL Chol Calc (NIH): 73 mg/dL (ref 0–99)
Triglycerides: 67 mg/dL (ref 0–149)
VLDL Cholesterol Cal: 13 mg/dL (ref 5–40)

## 2022-02-22 DIAGNOSIS — M47816 Spondylosis without myelopathy or radiculopathy, lumbar region: Secondary | ICD-10-CM | POA: Diagnosis not present

## 2022-02-22 DIAGNOSIS — Z79891 Long term (current) use of opiate analgesic: Secondary | ICD-10-CM | POA: Diagnosis not present

## 2022-02-22 DIAGNOSIS — M25571 Pain in right ankle and joints of right foot: Secondary | ICD-10-CM | POA: Diagnosis not present

## 2022-02-22 DIAGNOSIS — M25572 Pain in left ankle and joints of left foot: Secondary | ICD-10-CM | POA: Diagnosis not present

## 2022-02-22 DIAGNOSIS — G894 Chronic pain syndrome: Secondary | ICD-10-CM | POA: Diagnosis not present

## 2022-03-16 ENCOUNTER — Ambulatory Visit (INDEPENDENT_AMBULATORY_CARE_PROVIDER_SITE_OTHER): Payer: BC Managed Care – PPO | Admitting: Podiatry

## 2022-03-16 DIAGNOSIS — L97512 Non-pressure chronic ulcer of other part of right foot with fat layer exposed: Secondary | ICD-10-CM | POA: Diagnosis not present

## 2022-03-16 DIAGNOSIS — L97522 Non-pressure chronic ulcer of other part of left foot with fat layer exposed: Secondary | ICD-10-CM

## 2022-03-16 MED ORDER — METHYLPREDNISOLONE 4 MG PO TBPK: ORAL_TABLET | ORAL | 0 refills | Status: DC

## 2022-03-16 MED ORDER — MELOXICAM 15 MG PO TABS: 15.0000 mg | ORAL_TABLET | Freq: Every day | ORAL | 0 refills | Status: DC

## 2022-03-21 ENCOUNTER — Other Ambulatory Visit: Payer: Self-pay | Admitting: Interventional Cardiology

## 2022-03-22 DIAGNOSIS — M47816 Spondylosis without myelopathy or radiculopathy, lumbar region: Secondary | ICD-10-CM | POA: Diagnosis not present

## 2022-03-22 DIAGNOSIS — G894 Chronic pain syndrome: Secondary | ICD-10-CM | POA: Diagnosis not present

## 2022-03-22 DIAGNOSIS — M25572 Pain in left ankle and joints of left foot: Secondary | ICD-10-CM | POA: Diagnosis not present

## 2022-03-22 DIAGNOSIS — M25571 Pain in right ankle and joints of right foot: Secondary | ICD-10-CM | POA: Diagnosis not present

## 2022-03-23 ENCOUNTER — Encounter: Payer: Self-pay | Admitting: Podiatry

## 2022-03-23 NOTE — Progress Notes (Signed)
Subjective:  Patient ID: Whitney Daniel, female    DOB: 09-02-1956,  MRN: [id number]  Chief Complaint  Patient presents with   Foot Pain    66 y.o. female presents for wound care.  Patient presents with complaint bilateral hallux ulceration with fat layer exposed.  Patient states that there might be infection present in the foot.  She states been present for quite some time is progressive gotten worse.  She is not a diabetic.  She has not seen anyone else prior to seeing me.  She would like to discuss treatment options for this.  Pain scale is 2 out of 10.  She has not done anything to help her feel better.   Review of Systems: Negative except as noted in the HPI. Denies N/V/F/Ch.  Past Medical History:  Diagnosis Date   A-fib ([place])    Anemia    Cancer ([place])    Fibromyalgia    Hypertension    Hypothyroidism    Osteoarthritis    SVD (spontaneous vaginal delivery)    x 5    Current Outpatient Medications:    meloxicam (MOBIC) 15 MG tablet, Take 1 tablet (15 mg total) by mouth daily., Disp: 30 tablet, Rfl: 0   methylPREDNISolone (MEDROL DOSEPAK) 4 MG TBPK tablet, Take as directed, Disp: 21 each, Rfl: 0   albuterol (VENTOLIN HFA) 108 (90 Base) MCG/ACT inhaler, 2 puffs as needed, Disp: , Rfl:    amLODipine (NORVASC) 5 MG tablet, TAKE 1 TABLET (5 MG TOTAL) BY MOUTH DAILY., Disp: 90 tablet, Rfl: 2   apixaban (ELIQUIS) 5 MG TABS tablet, Take 1 tablet (5 mg total) by mouth 2 (two) times daily., Disp: 180 tablet, Rfl: 1   Ascorbic Acid (VITAMIN C) 500 MG CHEW, Chew 500 mg by mouth daily., Disp: , Rfl:    B Complex-Biotin-FA (B-50 COMPLEX PO), 50 mg daily., Disp: , Rfl:    Biotin 10000 MCG TABS, Take 10,000 mcg by mouth daily., Disp: , Rfl:    budesonide-formoterol (SYMBICORT) 160-4.5 MCG/ACT inhaler, Inhale 2 puffs into the lungs daily as needed (shortness of breath or wheezing). , Disp: , Rfl:    ciprofloxacin (CILOXAN) 0.3 % ophthalmic solution, Place 2 drops into the right eye every 4  (four) hours while awake. Place 1 drop in the eye every 2 hours while awake for 2 days, then 1 drop every 4 hours while awake for 5 days., Disp: 5 mL, Rfl: 0   Evolocumab (REPATHA SURECLICK) 366 MG/ML SOAJ, Inject 140 mg into the skin every 14 (fourteen) days., Disp: 2 mL, Rfl: 11   hydrOXYzine (ATARAX/VISTARIL) 25 MG tablet, Take 25 mg by mouth as needed for irritation., Disp: , Rfl:    isosorbide mononitrate (IMDUR) 30 MG 24 hr tablet, TAKE 1/2 TABLETS (15 MG TOTAL) BY MOUTH ONCE DAILY., Disp: 15 tablet, Rfl: 0   levothyroxine (SYNTHROID, LEVOTHROID) 75 MCG tablet, Take 75 mcg by mouth daily before breakfast. , Disp: , Rfl:    LINZESS 145 MCG CAPS capsule, Take 145 mcg by mouth daily., Disp: , Rfl:    losartan-hydrochlorothiazide (HYZAAR) 50-12.5 MG tablet, Take 1 tablet by mouth daily., Disp: , Rfl:    Multiple Vitamin (MULTIVITAMIN WITH MINERALS) TABS, Take 1 tablet by mouth daily., Disp: , Rfl:    nitroGLYCERIN (NITROSTAT) 0.4 MG SL tablet, TAKE 1 TABLET BY MOUTH EVERY 5 MINUTES AS NEEDED FOR CHEST PAIN, Disp: 25 tablet, Rfl: 5   pregabalin (LYRICA) 75 MG capsule, Take 75 mg by mouth 3 (three) times  daily., Disp: , Rfl:    traMADol (ULTRAM) 50 MG tablet, Take 1 tablet (50 mg total) by mouth 2 (two) times daily as needed for moderate pain., Disp: 60 tablet, Rfl: 5  Social History   Tobacco Use  Smoking Status Never  Smokeless Tobacco Never    Allergies  Allergen Reactions   Codeine Other (See Comments)    Patient states "she did not like the way codeine made her feel"     Vicodin [Hydrocodone-Acetaminophen] Nausea And Vomiting    Doesn't like how it makes her feel but can take if needs to for pain   Objective:  There were no vitals filed for this visit. There is no height or weight on file to calculate BMI. Constitutional Well developed. Well nourished.  Vascular Dorsalis pedis pulses palpable bilaterally. Posterior tibial pulses palpable bilaterally. Capillary refill normal to  all digits.  No cyanosis or clubbing noted. Pedal hair growth normal.  Neurologic Normal speech. Oriented to person, place, and time. Protective sensation absent  Dermatologic Wound Location: Bilateral hallux with fat layer exposed.  Does not probe down to bone.  Mild redness noted.  No purulent drainage or malodor present. Wound Base: Mixed Granular/Fibrotic Peri-wound: Calloused Exudate: Scant/small amount Serosanguinous exudate Wound Measurements: -See below  Orthopedic: No pain to palpation either foot.   Radiographs: None Assessment:   1. Chronic toe ulcer, right, with fat layer exposed ([place])   2. Chronic toe ulcer, left, with fat layer exposed ([place])    Plan:  Patient was evaluated and treated and all questions answered.  Ulcer bilateral hallux ulceration fat layer exposed -Debridement as below. -Dressed with surgical shoe and Betadine wet-to-dry, DSD. -Continue off-loading with surgical shoe.  Procedure: Excisional Debridement of Wound right hallux Tool: Sharp chisel blade/tissue nipper Rationale: Removal of non-viable soft tissue from the wound to promote healing.  Anesthesia: none Pre-Debridement Wound Measurements: 0.5 cm x 0.6 cm x 0.3 cm  Post-Debridement Wound Measurements: 0.6 cm x 0.7 cm x 0.3 cm  Type of Debridement: Sharp Excisional Tissue Removed: Non-viable soft tissue Blood loss: Minimal (<50cc) Depth of Debridement: subcutaneous tissue. Technique: Sharp excisional debridement to bleeding, viable wound base.  Wound Progress: This is my initial evaluation I will continue to monitor the progression of the wound Site healing conversation 7 Dressing: Dry, sterile, compression dressing. Disposition: Patient tolerated procedure well. Patient to return in 1 week for follow-up.   Procedure: Excisional Debridement of Wound left hallux Tool: Sharp chisel blade/tissue nipper Rationale: Removal of non-viable soft tissue from the wound to promote healing.   Anesthesia: none Pre-Debridement Wound Measurements: 0.4 cm x 0.5 cm x 0.3 cm  Post-Debridement Wound Measurements: 0.6 cm x 0.7 cm x 0.3 cm  Type of Debridement: Sharp Excisional Tissue Removed: Non-viable soft tissue Blood loss: Minimal (<50cc) Depth of Debridement: subcutaneous tissue. Technique: Sharp excisional debridement to bleeding, viable wound base.  Wound Progress: This is my initial evaluation I will continue to monitor the progression of the wound Site healing conversation 7 Dressing: Dry, sterile, compression dressing. Disposition: Patient tolerated procedure well. Patient to return in 1 week for follow-up.  No follow-ups on file.

## 2022-03-25 ENCOUNTER — Other Ambulatory Visit: Payer: Self-pay | Admitting: Interventional Cardiology

## 2022-03-27 NOTE — Telephone Encounter (Signed)
Prescription refill request for Eliquis received. Indication:Afib Last office visit:1/23 Scr:0.8 Age: 66 Weight:86.2 kg  Prescription refilled

## 2022-04-10 ENCOUNTER — Telehealth: Payer: Self-pay | Admitting: Interventional Cardiology

## 2022-04-10 NOTE — Telephone Encounter (Signed)
  Pt c/o medication issue:  1. Name of Medication:   Evolocumab (REPATHA SURECLICK) 379 MG/ML SOAJ    2. How are you currently taking this medication (dosage and times per day)? TAKE 1 TABLET (5 MG TOTAL) BY MOUTH DAILY.  3. Are you having a reaction (difficulty breathing--STAT)? No   4. What is your medication issue? Pt needs help to reapply for pt assistance for this medication

## 2022-04-11 NOTE — Telephone Encounter (Signed)
Pt should not need pt assistance, she has Pharmacist, community and was previously set up for Repatha $5 copay card in January when rx was prescribed with info sent to her in 1/10 MyChart message. Called pt to see what the issue is, she states she never gave copay card info to the pharmacy. Advised her to do this. She verbalized understanding.

## 2022-04-12 ENCOUNTER — Other Ambulatory Visit: Payer: Self-pay | Admitting: Podiatry

## 2022-04-24 ENCOUNTER — Other Ambulatory Visit: Payer: Self-pay | Admitting: Interventional Cardiology

## 2022-04-24 DIAGNOSIS — M47816 Spondylosis without myelopathy or radiculopathy, lumbar region: Secondary | ICD-10-CM | POA: Diagnosis not present

## 2022-04-24 DIAGNOSIS — M545 Low back pain, unspecified: Secondary | ICD-10-CM | POA: Diagnosis not present

## 2022-05-01 DIAGNOSIS — R928 Other abnormal and inconclusive findings on diagnostic imaging of breast: Secondary | ICD-10-CM | POA: Diagnosis not present

## 2022-05-02 DIAGNOSIS — G5603 Carpal tunnel syndrome, bilateral upper limbs: Secondary | ICD-10-CM | POA: Diagnosis not present

## 2022-05-02 DIAGNOSIS — M79676 Pain in unspecified toe(s): Secondary | ICD-10-CM

## 2022-05-02 DIAGNOSIS — G5601 Carpal tunnel syndrome, right upper limb: Secondary | ICD-10-CM | POA: Diagnosis not present

## 2022-05-05 ENCOUNTER — Telehealth: Payer: Self-pay | Admitting: *Deleted

## 2022-05-05 NOTE — Telephone Encounter (Signed)
   Pre-operative Risk Assessment    Patient Name: Whitney Daniel  DOB: [birth year] MRN: [id number]      Request for Surgical Clearance    Procedure:   RADIO FREQUENCY; I TRIED TO CALL THE OFFICE TO CLARIFY THE PROCEDURE AS ALL THAT WAS ON THE REQUEST WAS RADIO FREQ.  OFFICE IS CLOSED FOR THE HOLIDAY, WILL HAVE TO CALL BACK ON TUESDAY 05/09/22  Date of Surgery:  Clearance 05/15/22                                 Surgeon:  NOT LISTED Surgeon's Group or Practice Name:  [facility] Phone number:  [phone] Fax number:  [phone]   Type of Clearance Requested:   - Medical  - Pharmacy:  Hold Apixaban (Eliquis) x 3 DAYS PRIOR TO INJECTION   Type of Anesthesia:  Not Indicated; NEED TO CLARIFY IF ANY ANESTHESIA IS BEING USED   Additional requests/questions:    [name]   05/05/2022, 12:32 PM

## 2022-05-09 NOTE — Telephone Encounter (Signed)
I tried to reach the requesting office by phone though was on hold for over 10 minutes with no answering the phone. I will send a fax over asking for clarification.   May fax over a new clearance request with clarification [phone] attn pre op team

## 2022-05-10 NOTE — Telephone Encounter (Signed)
Requesting office called back and clarified procedure:  Procedure: Radio frequency of L4-S1  Anesthesia: local  MD: Dr. Gaspar Bidding

## 2022-05-10 NOTE — Telephone Encounter (Signed)
Left message to please fax over a new clearance request with complete information as to the procedure to be done and name of MD doing procedure.  I will fax this note again asking for clarification. Send new fax to [phone] attn: pre op team.   Otherwise we cannot proceed at this time, until clarification.

## 2022-05-11 NOTE — Telephone Encounter (Signed)
Patient with diagnosis of afib on Eliquis for anticoagulation.    Procedure: Radio frequency of L4-S1 Date of procedure: 05/15/22   CHA2DS2-VASc Score = 3   This indicates a 3.2% annual risk of stroke. The patient's score is based upon: CHF History: 0 HTN History: 1 Diabetes History: 0 Stroke History: 0 Vascular Disease History: 1 Age Score: 0 Gender Score: 1      CrCl 81 ml/min  Per office protocol, patient can hold Eliquis for 3 days prior to procedure.    **This guidance is not considered finalized until pre-operative APP has relayed final recommendations.**

## 2022-05-11 NOTE — Telephone Encounter (Signed)
Called patient to update her on holding Eliquis for upcoming procedure and I was also going to ensure that she was not having any concerning cardiac symptoms.  She states she has canceled the procedure.  I advised her to notify us in the future if procedure is rescheduled.  She thanked me for the call.  Emmaline Life, NP-C     05/11/2022, 2:39 PM [address], [address] Office [phone] Fax [phone]

## 2022-05-12 ENCOUNTER — Other Ambulatory Visit: Payer: Self-pay | Admitting: Podiatry

## 2022-05-12 NOTE — Telephone Encounter (Signed)
Interaction between this medication and eliquis, please advise.

## 2022-05-23 DIAGNOSIS — Z780 Asymptomatic menopausal state: Secondary | ICD-10-CM | POA: Diagnosis not present

## 2022-05-23 DIAGNOSIS — M8588 Other specified disorders of bone density and structure, other site: Secondary | ICD-10-CM | POA: Diagnosis not present

## 2022-05-24 ENCOUNTER — Ambulatory Visit: Payer: Medicare Other | Admitting: Podiatry

## 2022-05-31 ENCOUNTER — Encounter: Payer: Self-pay | Admitting: Podiatry

## 2022-05-31 ENCOUNTER — Ambulatory Visit (INDEPENDENT_AMBULATORY_CARE_PROVIDER_SITE_OTHER): Payer: BC Managed Care – PPO | Admitting: Podiatry

## 2022-05-31 DIAGNOSIS — M25872 Other specified joint disorders, left ankle and foot: Secondary | ICD-10-CM

## 2022-05-31 NOTE — Progress Notes (Addendum)
Subjective:  Patient ID: Whitney Daniel, female    DOB: 04/14/1956,  MRN: [id number]  Chief Complaint  Patient presents with   Foot Pain    66 y.o. female presents with the above complaint.  Patient presents with new complaint of right anterior ankle impingement.  Patient states thatIs causing her some days comfort in the ankle.  She states that hurts with ambulation and hurts with pressure pain scale is 6 out of 10.  She is noticing more more discomfort with her ankles.  She has not seen anyone as prior to seeing me she denies any other acute complaints.  She does not have any ulceration.   Review of Systems: Negative except as noted in the HPI. Denies N/V/F/Ch.  Past Medical History:  Diagnosis Date   A-fib ([place])    Anemia    Cancer ([place])    Fibromyalgia    Hypertension    Hypothyroidism    Osteoarthritis    SVD (spontaneous vaginal delivery)    x 5    Current Outpatient Medications:    albuterol (VENTOLIN HFA) 108 (90 Base) MCG/ACT inhaler, 2 puffs as needed, Disp: , Rfl:    amLODipine (NORVASC) 5 MG tablet, TAKE 1 TABLET (5 MG TOTAL) BY MOUTH DAILY., Disp: 90 tablet, Rfl: 2   Ascorbic Acid (VITAMIN C) 500 MG CHEW, Chew 500 mg by mouth daily., Disp: , Rfl:    B Complex-Biotin-FA (B-50 COMPLEX PO), 50 mg daily., Disp: , Rfl:    Biotin 10000 MCG TABS, Take 10,000 mcg by mouth daily., Disp: , Rfl:    budesonide-formoterol (SYMBICORT) 160-4.5 MCG/ACT inhaler, Inhale 2 puffs into the lungs daily as needed (shortness of breath or wheezing). , Disp: , Rfl:    ciprofloxacin (CILOXAN) 0.3 % ophthalmic solution, Place 2 drops into the right eye every 4 (four) hours while awake. Place 1 drop in the eye every 2 hours while awake for 2 days, then 1 drop every 4 hours while awake for 5 days., Disp: 5 mL, Rfl: 0   ELIQUIS 5 MG TABS tablet, TAKE 1 TABLET BY MOUTH TWICE A DAY, Disp: 60 tablet, Rfl: 4   Evolocumab (REPATHA SURECLICK) 299 MG/ML SOAJ, Inject 140 mg into the skin every 14  (fourteen) days., Disp: 2 mL, Rfl: 11   hydrOXYzine (ATARAX/VISTARIL) 25 MG tablet, Take 25 mg by mouth as needed for irritation., Disp: , Rfl:    isosorbide mononitrate (IMDUR) 30 MG 24 hr tablet, TAKE .5 TABLET BY MOUTH ONCE DAILY, Disp: 15 tablet, Rfl: 2   levothyroxine (SYNTHROID, LEVOTHROID) 75 MCG tablet, Take 75 mcg by mouth daily before breakfast. , Disp: , Rfl:    LINZESS 145 MCG CAPS capsule, Take 145 mcg by mouth daily., Disp: , Rfl:    losartan-hydrochlorothiazide (HYZAAR) 50-12.5 MG tablet, Take 1 tablet by mouth daily., Disp: , Rfl:    meloxicam (MOBIC) 15 MG tablet, TAKE 1 TABLET (15 MG TOTAL) BY MOUTH DAILY., Disp: 30 tablet, Rfl: 0   methylPREDNISolone (MEDROL DOSEPAK) 4 MG TBPK tablet, Take as directed, Disp: 21 each, Rfl: 0   Multiple Vitamin (MULTIVITAMIN WITH MINERALS) TABS, Take 1 tablet by mouth daily., Disp: , Rfl:    nitroGLYCERIN (NITROSTAT) 0.4 MG SL tablet, TAKE 1 TABLET BY MOUTH EVERY 5 MINUTES AS NEEDED FOR CHEST PAIN, Disp: 25 tablet, Rfl: 5   pregabalin (LYRICA) 75 MG capsule, Take 75 mg by mouth 3 (three) times daily., Disp: , Rfl:    traMADol (ULTRAM) 50 MG tablet, Take 1 tablet (  50 mg total) by mouth 2 (two) times daily as needed for moderate pain., Disp: 60 tablet, Rfl: 5  Social History   Tobacco Use  Smoking Status Never  Smokeless Tobacco Never    Allergies  Allergen Reactions   Codeine Other (See Comments)    Patient states "she did not like the way codeine made her feel"     Vicodin [Hydrocodone-Acetaminophen] Nausea And Vomiting    Doesn't like how it makes her feel but can take if needs to for pain   Objective:  There were no vitals filed for this visit. There is no height or weight on file to calculate BMI. Constitutional Well developed. Well nourished.  Vascular Dorsalis pedis pulses palpable bilaterally. Posterior tibial pulses palpable bilaterally. Capillary refill normal to all digits.  No cyanosis or clubbing noted. Pedal hair  growth normal.  Neurologic Normal speech. Oriented to person, place, and time. Epicritic sensation to light touch grossly present bilaterally.  Dermatologic Nails well groomed and normal in appearance. No open wounds. No skin lesions.  Orthopedic: Negative anterior drawer test or talar tilt test noted.  Limited range of motion noted at the ankle joint no deep intra-articular ankle pain noted.  Pain with dorsiflexion of the ankle joint likely due to the impingement of the anterior ankle.   Radiographs: None Assessment:   1. Ankle impingement syndrome, left    Plan:  Patient was evaluated and treated and all questions answered.  Right ankle ankle impingement -All questions and concerns were discussed with the patient in extensive detail.  Given the amount of pain that she is experiencing she will benefit from cam boot immobilization to allow the soft tissue structure as well as the joint to heal itself.  If there is no improvement we will discuss steroid injection versus MRI during next clinical visit.  She states understanding. -Cam boot was dispensed  No follow-ups on file.   Anterior ankle impingement cam boot immobilization  You are

## 2022-06-02 ENCOUNTER — Telehealth: Payer: Self-pay | Admitting: Podiatry

## 2022-06-02 NOTE — Telephone Encounter (Signed)
Patient tried to wear boot at work and just use intermittent leave when needed. Whitney Daniel job said shes not able to wear a boot at work and they do not have seated positions to put her in. [name] is going to have to take short term disability. How long will she be out of work?

## 2022-06-05 ENCOUNTER — Other Ambulatory Visit: Payer: Self-pay | Admitting: Interventional Cardiology

## 2022-06-05 DIAGNOSIS — I25118 Atherosclerotic heart disease of native coronary artery with other forms of angina pectoris: Secondary | ICD-10-CM

## 2022-06-06 DIAGNOSIS — M47816 Spondylosis without myelopathy or radiculopathy, lumbar region: Secondary | ICD-10-CM | POA: Diagnosis not present

## 2022-06-06 DIAGNOSIS — M79676 Pain in unspecified toe(s): Secondary | ICD-10-CM

## 2022-06-07 NOTE — Progress Notes (Signed)
Cardiology Office Note:    Date:  06/07/2022   ID:  Whitney, Daniel 11/13/1955, MRN [id number]  PCP:  London Pepper, MD  Cardiologist:  Sinclair Grooms, MD   Referring MD: London Pepper, MD   No chief complaint on file.   History of Present Illness:    Whitney Daniel is a 66 y.o. 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   Vague historian, but does give a history of recurring crampy chest discomfort.  Also has palpitations that can last seconds to minutes.  She has a history of PAF in the past.  Last month he did not demonstrate any atrial fibs.  She denies orthopnea, PND, peripheral edema.  No episodes of syncope.  No bleeding on Eliquis.  Past Medical History:  Diagnosis Date   A-fib ([place])    Anemia    Cancer ([place])    Fibromyalgia    Hypertension    Hypothyroidism    Osteoarthritis    SVD (spontaneous vaginal delivery)    x 5    Past Surgical History:  Procedure Laterality Date   COLONOSCOPY     DILATATION & CURRETTAGE/HYSTEROSCOPY WITH RESECTOCOPE N/A 07/15/2014   Procedure: DILATATION & CURETTAGE/HYSTEROSCOPY WITH RESECTOCOPE;  Surgeon: Marvene Staff, MD;  Location: [facility];  Service: Gynecology;  Laterality: N/A;   DILATION AND CURETTAGE OF UTERUS     polyps   LEFT HEART CATH AND CORONARY ANGIOGRAPHY N/A 04/16/2018   Procedure: LEFT HEART CATH AND CORONARY ANGIOGRAPHY;  Surgeon: Belva Crome, MD;  Location: [facility];  Service: Cardiovascular;  Laterality: N/A;   right thigh surgery     cancer   WISDOM TOOTH EXTRACTION      Current Medications: No outpatient medications have been marked as taking for the 06/08/22 encounter (Appointment) with Belva Crome, MD.     Allergies:   Codeine and Vicodin [hydrocodone-acetaminophen]   Social History   Socioeconomic History   Marital status: Married    Spouse name: Not on file   Number of children: Not  on file   Years of education: Not on file   Highest education level: Not on file  Occupational History   Not on file  Tobacco Use   Smoking status: Never   Smokeless tobacco: Never  Vaping Use   Vaping Use: Never used  Substance and Sexual Activity   Alcohol use: Yes    Alcohol/week: 0.0 standard drinks of alcohol    Comment: social - wine   Drug use: No   Sexual activity: Yes    Birth control/protection: Post-menopausal  Other Topics Concern   Not on file  Social History Narrative   Lives alone in a one story home.  Has 5 children.  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    Social Determinants of Health   Financial Resource Strain: Not on file  Food Insecurity: Not on file  Transportation Needs: Not on file  Physical Activity: Not on file  Stress: Not on file  Social Connections: Not on file     Family History: The patient's family history includes CVA in her mother; Cancer in her father; Diabetes in her father and sister; Heart attack in her sister; Hypertension in her mother.  ROS:   Please see the history of present illness.  She sleeps okay.  No medication side effects that she can tell.  She does not have nitroglycerin available but requests that we prescribe it.  All other systems reviewed and are negative.  EKGs/Labs/Other Studies Reviewed:    The following studies were reviewed today:  [facility] mobile telemetry unit 2022 Study Highlights  Normal sinus rhythm with HR range 56-136 bpm. Avg HR 65 bpm No atrial fibrillation. Rare PVC's with on 4 beat NSVT. No correlation between palpitations and arrhythmia.   Coronary CTA 2022: IMPRESSION: 1. Coronary calcium score of 4.96. This was 60 percentile for age-, sex, and race-matched controls.   2. Normal coronary origin with right dominance.   3. Moderate (50-69%) distal to apical LAD stenosis.    RECOMMENDATIONS: CAD-RADS 3  EKG:  EKG normal sinus rhythm, inferior Q waves, left axis deviation, and there is no change when compared to September 2022.  Recent Labs: 09/13/2021: BUN 9; Creatinine, Ser 0.80; Potassium 4.0; Sodium 141 02/01/2022: ALT 17  Recent Lipid Panel    Component Value Date/Time   CHOL 148 02/01/2022 1730   TRIG 67 02/01/2022 1730   HDL 62 02/01/2022 1730   CHOLHDL 2.4 02/01/2022 1730   CHOLHDL 4.5 04/16/2018 0605   VLDL 16 04/16/2018 0605   LDLCALC 73 02/01/2022 1730    Physical Exam:    VS:  There were no vitals taken for this visit.    Wt Readings from Last 3 Encounters:  07/26/21 190 lb (86.2 kg)  05/01/21 204 lb 6.4 oz (92.7 kg)  01/10/21 214 lb (97.1 kg)     GEN: Healthy appearing. No acute distress HEENT: Normal NECK: No JVD. LYMPHATICS: No lymphadenopathy CARDIAC: No murmur. RRR no gallop, or edema. VASCULAR:  Normal Pulses. No bruits. RESPIRATORY:  Clear to auscultation without rales, wheezing or rhonchi  ABDOMEN: Soft, non-tender, non-distended, No pulsatile mass, MUSCULOSKELETAL: No deformity  SKIN: Warm and dry NEUROLOGIC:  Alert and oriented x 3 PSYCHIATRIC:  Normal affect   ASSESSMENT:    1. Coronary artery disease of native artery of native heart with stable angina pectoris ([place])   2. Hyperlipidemia, unspecified hyperlipidemia type   3. Essential hypertension   4. Paroxysmal atrial fibrillation (HCC)   5. Chronic anticoagulation   6. Elevated lipoprotein(a)    PLAN:    In order of problems listed above:  Secondary prevention reviewed.  She is encouraged to get more aerobic activity and.  Most recent LDL was 73 in May.  This is on Repatha. Please see above Repeat blood pressure 118/70 mmHg.  I have advised her to discontinue isosorbide mononitrate of which she is taking 15 mg/day.  I do not believe this is doing her any good.  She will measure her blood pressure daily at least between 2 and 8 hours after she has her  morning medications and report those values to [place] in approximately 1 week. The last monitor did not demonstrate any recurrence of atrial fibrillation but given continued episodes of palpitations, she should continue Eliquis twice daily. See above.  Overall education and awareness concerning primary/secondary risk prevention was discussed in detail: LDL less than 70, hemoglobin A1c less than 7, blood pressure target less than 130/80 mmHg, >150 minutes of moderate aerobic activity per week, avoidance of smoking, weight control (via diet and exercise), and continued surveillance/management of/for obstructive sleep apnea.  She would need a 9 to 45-monthclinical follow-up  Medication Adjustments/Labs and Tests Ordered: Current medicines are reviewed at length with the patient today.  Concerns  regarding medicines are outlined above.  No orders of the defined types were placed in this encounter.  No orders of the defined types were placed in this encounter.   There are no Patient Instructions on file for this visit.   Signed, Sinclair Grooms, MD  06/07/2022 1:18 PM    [facility]

## 2022-06-08 ENCOUNTER — Encounter: Payer: Self-pay | Admitting: Interventional Cardiology

## 2022-06-08 ENCOUNTER — Ambulatory Visit: Payer: BC Managed Care – PPO | Attending: Interventional Cardiology | Admitting: Interventional Cardiology

## 2022-06-08 VITALS — BP 90/60 | HR 79 | Ht 68.0 in | Wt 191.6 lb

## 2022-06-08 DIAGNOSIS — E785 Hyperlipidemia, unspecified: Secondary | ICD-10-CM | POA: Diagnosis not present

## 2022-06-08 DIAGNOSIS — I480 Paroxysmal atrial fibrillation: Secondary | ICD-10-CM

## 2022-06-08 DIAGNOSIS — I10 Essential (primary) hypertension: Secondary | ICD-10-CM

## 2022-06-08 DIAGNOSIS — E7841 Elevated Lipoprotein(a): Secondary | ICD-10-CM

## 2022-06-08 DIAGNOSIS — Z7901 Long term (current) use of anticoagulants: Secondary | ICD-10-CM

## 2022-06-08 DIAGNOSIS — I25118 Atherosclerotic heart disease of native coronary artery with other forms of angina pectoris: Secondary | ICD-10-CM

## 2022-06-08 MED ORDER — NITROGLYCERIN 0.4 MG SL SUBL: SUBLINGUAL_TABLET | SUBLINGUAL | 5 refills | Status: DC

## 2022-06-08 NOTE — Patient Instructions (Signed)
Medication Instructions:  Your physician has recommended you make the following change in your medication:   1) STOP isosorbide mononitrate (Imdur)  *If you need a refill on your cardiac medications before your next appointment, please call your pharmacy*  Lab Work: NONE  Testing/Procedures: NONE  Follow-Up: At [facility], you and your health needs are our priority.  As part of our continuing mission to provide you with exceptional heart care, we have created designated Provider Care Teams.  These Care Teams include your primary Cardiologist (physician) and Advanced Practice Providers (APPs -  Physician Assistants and Nurse Practitioners) who all work together to provide you with the care you need, when you need it.  Your next appointment:   9-12 month(s)  The format for your next appointment:   In Person  Provider:   Sinclair Grooms, MD  Other Instructions Monitor your blood pressure daily, checking at least 2 hours after taking morning medications. Please call our office at [phone] with your blood pressure readings in 1 week.  HOW TO TAKE YOUR BLOOD PRESSURE: Rest 5 minutes before taking your blood pressure.  Don't smoke or drink caffeinated beverages for at least 30 minutes before. Take your blood pressure before (not after) you eat. Sit comfortably with your back supported and both feet on the floor (don't cross your legs). Elevate your arm to heart level on a table or a desk. Use the proper sized cuff. It should fit smoothly and snugly around your bare upper arm. There should be enough room to slip a fingertip under the cuff. The bottom edge of the cuff should be 1 inch above the crease of the elbow. Please monitor your blood pressure and if your blood pressure consistently remains above 130 on the top or 80 on the bottom x3 please call our office at [phone] or send a MyChart message   Important Information About Sugar

## 2022-06-16 ENCOUNTER — Other Ambulatory Visit: Payer: Self-pay | Admitting: Podiatry

## 2022-06-28 ENCOUNTER — Ambulatory Visit: Payer: Medicare Other | Admitting: Podiatry

## 2022-06-28 ENCOUNTER — Ambulatory Visit (INDEPENDENT_AMBULATORY_CARE_PROVIDER_SITE_OTHER): Payer: BC Managed Care – PPO | Admitting: Podiatry

## 2022-06-28 DIAGNOSIS — M7751 Other enthesopathy of right foot: Secondary | ICD-10-CM | POA: Diagnosis not present

## 2022-06-28 NOTE — Progress Notes (Signed)
Subjective:  Patient ID: Whitney Daniel, female    DOB: 05-Feb-1956,  MRN: [id number]  Chief Complaint  Patient presents with   Foot Pain    66 y.o. female presents with the above complaint.  Patient presents with follow-up of right ankle pain.  She states that there are still some discomfort.  The cam boot immobilization helped some.  She wanted to discuss next treatment plan.  She has had a history of injection but has not done much.   Review of Systems: Negative except as noted in the HPI. Denies N/V/F/Ch.  Past Medical History:  Diagnosis Date   A-fib ([place])    Anemia    Cancer ([place])    Fibromyalgia    Hypertension    Hypothyroidism    Osteoarthritis    SVD (spontaneous vaginal delivery)    x 5    Current Outpatient Medications:    albuterol (VENTOLIN HFA) 108 (90 Base) MCG/ACT inhaler, 2 puffs as needed, Disp: , Rfl:    amLODipine (NORVASC) 5 MG tablet, Take 1 tablet (5 mg total) by mouth daily. Please keep upcoming appointment for future refills. Thank you., Disp: 30 tablet, Rfl: 0   B Complex-Biotin-FA (B-50 COMPLEX PO), 50 mg daily., Disp: , Rfl:    ELIQUIS 5 MG TABS tablet, TAKE 1 TABLET BY MOUTH TWICE A DAY, Disp: 60 tablet, Rfl: 4   Evolocumab (REPATHA SURECLICK) 992 MG/ML SOAJ, Inject 140 mg into the skin every 14 (fourteen) days., Disp: 2 mL, Rfl: 11   hydrOXYzine (ATARAX/VISTARIL) 25 MG tablet, Take 25 mg by mouth as needed for irritation., Disp: , Rfl:    levothyroxine (SYNTHROID, LEVOTHROID) 75 MCG tablet, Take 75 mcg by mouth daily before breakfast. , Disp: , Rfl:    LINZESS 145 MCG CAPS capsule, Take 145 mcg by mouth daily., Disp: , Rfl:    losartan-hydrochlorothiazide (HYZAAR) 50-12.5 MG tablet, Take 1 tablet by mouth daily., Disp: , Rfl:    Multiple Vitamin (MULTIVITAMIN WITH MINERALS) TABS, Take 1 tablet by mouth daily., Disp: , Rfl:    nitroGLYCERIN (NITROSTAT) 0.4 MG SL tablet, TAKE 1 TABLET BY MOUTH EVERY 5 MINUTES AS NEEDED FOR CHEST PAIN, Disp: 25  tablet, Rfl: 5   pregabalin (LYRICA) 100 MG capsule, Take 1 capsule by mouth in the morning and at bedtime., Disp: , Rfl:    traMADol (ULTRAM) 50 MG tablet, Take 1 tablet (50 mg total) by mouth 2 (two) times daily as needed for moderate pain., Disp: 60 tablet, Rfl: 5  Social History   Tobacco Use  Smoking Status Never  Smokeless Tobacco Never    Allergies  Allergen Reactions   Acetaminophen    Codeine Other (See Comments)    Patient states "she did not like the way codeine made her feel"     Hydrocodone Bitartrate Er     Other reaction(s): Unknown   Oxycodone Hcl    Vicodin [Hydrocodone-Acetaminophen] Nausea And Vomiting    Doesn't like how it makes her feel but can take if needs to for pain   Objective:  There were no vitals filed for this visit. There is no height or weight on file to calculate BMI. Constitutional Well developed. Well nourished.  Vascular Dorsalis pedis pulses palpable bilaterally. Posterior tibial pulses palpable bilaterally. Capillary refill normal to all digits.  No cyanosis or clubbing noted. Pedal hair growth normal.  Neurologic Normal speech. Oriented to person, place, and time. Epicritic sensation to light touch grossly present bilaterally.  Dermatologic Nails well groomed and  normal in appearance. No open wounds. No skin lesions.  Orthopedic: Negative anterior drawer test or talar tilt test noted.  Limited range of motion noted at the ankle joint no deep intra-articular ankle pain noted.  Pain with dorsiflexion of the ankle joint likely due to the impingement of the anterior ankle.   Radiographs: None Assessment:   1. Tendinitis of right foot    Plan:  Patient was evaluated and treated and all questions answered.  Right ankle ankle impingement -All questions and concerns were discussed with the patient in extensive detail.  Continue using cam boot but would like for transition to Tri-Lock ankle brace.  Given the amount of the pain that she  is still having she will benefit from an MRI. -Order for an MRI was placed.  Rule out tendinitis/ankle impingement  No follow-ups on file.   Anterior ankle impingement cam boot immobilization  You are

## 2022-06-30 ENCOUNTER — Other Ambulatory Visit: Payer: Self-pay | Admitting: Interventional Cardiology

## 2022-06-30 DIAGNOSIS — I25118 Atherosclerotic heart disease of native coronary artery with other forms of angina pectoris: Secondary | ICD-10-CM

## 2022-06-30 DIAGNOSIS — M25571 Pain in right ankle and joints of right foot: Secondary | ICD-10-CM | POA: Diagnosis not present

## 2022-07-11 ENCOUNTER — Ambulatory Visit
Admission: RE | Admit: 2022-07-11 | Discharge: 2022-07-11 | Disposition: A | Discharge status: Home / Self Care | Payer: BC Managed Care – PPO | Source: Ambulatory Visit | Attending: Podiatry | Admitting: Podiatry

## 2022-07-11 DIAGNOSIS — M25471 Effusion, right ankle: Secondary | ICD-10-CM | POA: Diagnosis not present

## 2022-07-11 DIAGNOSIS — M19071 Primary osteoarthritis, right ankle and foot: Secondary | ICD-10-CM | POA: Diagnosis not present

## 2022-07-11 DIAGNOSIS — M7751 Other enthesopathy of right foot: Secondary | ICD-10-CM

## 2022-07-11 DIAGNOSIS — R600 Localized edema: Secondary | ICD-10-CM | POA: Diagnosis not present

## 2022-07-19 DIAGNOSIS — M47816 Spondylosis without myelopathy or radiculopathy, lumbar region: Secondary | ICD-10-CM | POA: Diagnosis not present

## 2022-07-20 ENCOUNTER — Telehealth: Payer: Self-pay | Admitting: *Deleted

## 2022-07-20 DIAGNOSIS — I10 Essential (primary) hypertension: Secondary | ICD-10-CM | POA: Diagnosis not present

## 2022-07-20 DIAGNOSIS — R635 Abnormal weight gain: Secondary | ICD-10-CM | POA: Diagnosis not present

## 2022-07-20 DIAGNOSIS — Z86010 Personal history of colonic polyps: Secondary | ICD-10-CM | POA: Diagnosis not present

## 2022-07-20 NOTE — Telephone Encounter (Signed)
   Patient Name: Whitney Daniel  DOB: 1956-04-28 MRN: [id number]  Primary Cardiologist: Sinclair Grooms, MD  Clinical pharmacists have reviewed the patient's past medical history, labs, and current medications as part of preoperative protocol coverage. The following recommendations have been made:  Patient with diagnosis of PAF(paroxysmal atrial fibrillation) on Eliquis for anticoagulation.     Procedure: Colonoscopy Date of procedure: 10/13/2022     CHA2DS2-VASc Score = 3   This indicates a 3.2% annual risk of stroke. The patient's score is based upon: CHF History: 0 HTN History: 1 Diabetes History: 0 Stroke History: 0 Vascular Disease History: 1 Age Score: 0 Gender Score: 1       CrCl 84 ml/min (SrCr 0.93 05/04/2022)   Per office protocol, patient can hold Eliquis for 2 days prior to procedure.  Please resume Eliquis as soon as possible postprocedure, at the discretion of the surgeon.    I will route this recommendation to the requesting party via Epic fax function and remove from pre-op pool.  Please call with questions.  Lenna Sciara, NP 07/20/2022, 3:51 PM

## 2022-07-20 NOTE — Telephone Encounter (Signed)
   Pre-operative Risk Assessment    Patient Name: Whitney Daniel  DOB: 12/22/1955 MRN: [id number]      Request for Surgical Clearance    Procedure:   COLONOSCOPY   Date of Surgery:  Clearance 10/13/22                                 Surgeon:  DR. HUNG Surgeon's Group or Practice Name:  [facility] Phone number:  [phone] Fax number:  [phone]   Type of Clearance Requested:   - Pharmacy:  Hold Apixaban (Eliquis) NOT INDICATED HOW LONG    Type of Anesthesia:   PROPOFOL    Additional requests/questions:    [name]   07/20/2022, 10:19 AM

## 2022-07-20 NOTE — Telephone Encounter (Signed)
Patient with diagnosis of PAF(paroxysmal atrial fibrillation) on Eliquis for anticoagulation.    Procedure: Colonoscopy Date of procedure: 10/13/2022   CHA2DS2-VASc Score = 3   This indicates a 3.2% annual risk of stroke. The patient's score is based upon: CHF History: 0 HTN History: 1 Diabetes History: 0 Stroke History: 0 Vascular Disease History: 1 Age Score: 0 Gender Score: 1     CrCl 84 ml/min (SrCr 0.93 05/04/2022)  Per office protocol, patient can hold Eliquis for 2 days prior to procedure.     **This guidance is not considered finalized until pre-operative APP has relayed final recommendations.**

## 2022-07-26 ENCOUNTER — Ambulatory Visit (INDEPENDENT_AMBULATORY_CARE_PROVIDER_SITE_OTHER): Payer: BC Managed Care – PPO | Admitting: Podiatry

## 2022-07-26 DIAGNOSIS — M19079 Primary osteoarthritis, unspecified ankle and foot: Secondary | ICD-10-CM | POA: Diagnosis not present

## 2022-07-26 NOTE — Progress Notes (Signed)
Subjective:  Patient ID: Whitney [place], female    DOB: [birth year],  MRN: [id number]  Chief Complaint  Patient presents with   Foot Pain    Tendinitis right foot, patient states that the pain can be a 10 out of 74, MRI Results     66 y.o. female presents with the above complaint.  Patient presents with follow-up of right anterior ankle pain/talonavicular joint pain.  She states the pain is about the same.  Nothing is helped immobilization has not helped.  She would still like to focus on conservative care she is here to go over the MRI   Review of Systems: Negative except as noted in the HPI. Denies N/V/F/Ch.  Past Medical History:  Diagnosis Date   A-fib ([place])    Anemia    Cancer ([place])    Fibromyalgia    Hypertension    Hypothyroidism    Osteoarthritis    SVD (spontaneous vaginal delivery)    x 5    Current Outpatient Medications:    albuterol (VENTOLIN HFA) 108 (90 Base) MCG/ACT inhaler, 2 puffs as needed, Disp: , Rfl:    amLODipine (NORVASC) 5 MG tablet, TAKE 1 TABLET BY MOUTH DAILY, Disp: 90 tablet, Rfl: 1   B Complex-Biotin-FA (B-50 COMPLEX PO), 50 mg daily., Disp: , Rfl:    ELIQUIS 5 MG TABS tablet, TAKE 1 TABLET BY MOUTH TWICE A DAY, Disp: 60 tablet, Rfl: 4   Evolocumab (REPATHA SURECLICK) 814 MG/ML SOAJ, Inject 140 mg into the skin every 14 (fourteen) days., Disp: 2 mL, Rfl: 11   hydrOXYzine (ATARAX/VISTARIL) 25 MG tablet, Take 25 mg by mouth as needed for irritation., Disp: , Rfl:    levothyroxine (SYNTHROID, LEVOTHROID) 75 MCG tablet, Take 75 mcg by mouth daily before breakfast. , Disp: , Rfl:    LINZESS 145 MCG CAPS capsule, Take 145 mcg by mouth daily., Disp: , Rfl:    losartan-hydrochlorothiazide (HYZAAR) 50-12.5 MG tablet, Take 1 tablet by mouth daily., Disp: , Rfl:    Multiple Vitamin (MULTIVITAMIN WITH MINERALS) TABS, Take 1 tablet by mouth daily., Disp: , Rfl:    nitroGLYCERIN (NITROSTAT) 0.4 MG SL tablet, TAKE 1 TABLET BY MOUTH EVERY 5 MINUTES AS NEEDED FOR  CHEST PAIN, Disp: 25 tablet, Rfl: 5   pregabalin (LYRICA) 100 MG capsule, Take 1 capsule by mouth in the morning and at bedtime., Disp: , Rfl:    traMADol (ULTRAM) 50 MG tablet, Take 1 tablet (50 mg total) by mouth 2 (two) times daily as needed for moderate pain., Disp: 60 tablet, Rfl: 5  Social History   Tobacco Use  Smoking Status Never  Smokeless Tobacco Never    Allergies  Allergen Reactions   Acetaminophen    Codeine Other (See Comments)    Patient states "she did not like the way codeine made her feel"     Hydrocodone Bitartrate Er     Other reaction(s): Unknown   Oxycodone Hcl    Vicodin [Hydrocodone-Acetaminophen] Nausea And Vomiting    Doesn't like how it makes her feel but can take if needs to for pain   Objective:  There were no vitals filed for this visit. There is no height or weight on file to calculate BMI. Constitutional Well developed. Well nourished.  Vascular Dorsalis pedis pulses palpable bilaterally. Posterior tibial pulses palpable bilaterally. Capillary refill normal to all digits.  No cyanosis or clubbing noted. Pedal hair growth normal.  Neurologic Normal speech. Oriented to person, place, and time. Epicritic sensation to light  touch grossly present bilaterally.  Dermatologic Nails well groomed and normal in appearance. No open wounds. No skin lesions.  Orthopedic: Negative anterior drawer test or talar tilt test noted.  Limited range of motion noted at the ankle joint no deep intra-articular ankle pain noted.  Pain with dorsiflexion of the ankle joint likely due to the impingement of the anterior ankle.  Pain at the talonavicular joint.  Clinically able to appreciate osteophytes at the TN joint   Radiographs: IMPRESSION: 1. Nonvisualization of the anterior talofibular ligament with edema about the expected location suggesting tear, likely a chronic process. 2. Edema about the spring ligament suggesting chronic ligamentous sprain. 3.  Osteochondral injury about the medial aspect of the talar dome. 4. Moderate osteoarthritis of the talonavicular joint with subchondral cystic changes and marrow edema. 5. Mild subchondral edema of the cuboid suggesting mild arthritis. 6. Subcutaneous soft tissue edema about the ankle and dorsum of the foot without fluid collection or hematoma.   Assessment:   1. Osteoarthritis of talonavicular joint     Plan:  Patient was evaluated and treated and all questions answered.  Right talonavicular joint arthritis -All questions and concerns were discussed with the patient in extensive detail.  Continue using cam boot but would like for transition to Tri-Lock ankle brace.  Given the amount of the pain that she is still having she will benefit from an MRI. -MRI was reviewed with the patient in extensive detail.  At this time clinically correlating her pain most of her pain appears to be at the talonavicular joint with moderate osteoarthritis present.  The osteochondral injury is not bothering her at the ankle joint as her pain is more at the anterior dorsal foot near the ankle. -She will do physical therapy for 6 weeks to focus more on conservative care if there is no improvement we will discuss surgical fusion at that time.  She states understanding.  No follow-ups on file.

## 2022-08-01 ENCOUNTER — Other Ambulatory Visit: Payer: Self-pay | Admitting: Interventional Cardiology

## 2022-08-01 ENCOUNTER — Telehealth: Payer: Self-pay | Admitting: Podiatry

## 2022-08-01 NOTE — Telephone Encounter (Signed)
I am holding for 07/26/2022 office notes. Thank you

## 2022-08-07 DIAGNOSIS — R2689 Other abnormalities of gait and mobility: Secondary | ICD-10-CM | POA: Diagnosis not present

## 2022-08-07 DIAGNOSIS — M25571 Pain in right ankle and joints of right foot: Secondary | ICD-10-CM | POA: Diagnosis not present

## 2022-08-07 DIAGNOSIS — M6281 Muscle weakness (generalized): Secondary | ICD-10-CM | POA: Diagnosis not present

## 2022-08-07 DIAGNOSIS — M25671 Stiffness of right ankle, not elsewhere classified: Secondary | ICD-10-CM | POA: Diagnosis not present

## 2022-08-09 DIAGNOSIS — M25571 Pain in right ankle and joints of right foot: Secondary | ICD-10-CM | POA: Diagnosis not present

## 2022-08-09 DIAGNOSIS — R2689 Other abnormalities of gait and mobility: Secondary | ICD-10-CM | POA: Diagnosis not present

## 2022-08-09 DIAGNOSIS — M25671 Stiffness of right ankle, not elsewhere classified: Secondary | ICD-10-CM | POA: Diagnosis not present

## 2022-08-09 DIAGNOSIS — M6281 Muscle weakness (generalized): Secondary | ICD-10-CM | POA: Diagnosis not present

## 2022-08-14 DIAGNOSIS — M25571 Pain in right ankle and joints of right foot: Secondary | ICD-10-CM | POA: Diagnosis not present

## 2022-08-14 DIAGNOSIS — M6281 Muscle weakness (generalized): Secondary | ICD-10-CM | POA: Diagnosis not present

## 2022-08-14 DIAGNOSIS — R2689 Other abnormalities of gait and mobility: Secondary | ICD-10-CM | POA: Diagnosis not present

## 2022-08-14 DIAGNOSIS — M25671 Stiffness of right ankle, not elsewhere classified: Secondary | ICD-10-CM | POA: Diagnosis not present

## 2022-08-16 DIAGNOSIS — M47816 Spondylosis without myelopathy or radiculopathy, lumbar region: Secondary | ICD-10-CM | POA: Diagnosis not present

## 2022-08-16 DIAGNOSIS — Z79891 Long term (current) use of opiate analgesic: Secondary | ICD-10-CM | POA: Diagnosis not present

## 2022-08-18 DIAGNOSIS — M6281 Muscle weakness (generalized): Secondary | ICD-10-CM | POA: Diagnosis not present

## 2022-08-18 DIAGNOSIS — M25571 Pain in right ankle and joints of right foot: Secondary | ICD-10-CM | POA: Diagnosis not present

## 2022-08-18 DIAGNOSIS — R2689 Other abnormalities of gait and mobility: Secondary | ICD-10-CM | POA: Diagnosis not present

## 2022-08-18 DIAGNOSIS — M25671 Stiffness of right ankle, not elsewhere classified: Secondary | ICD-10-CM | POA: Diagnosis not present

## 2022-08-19 ENCOUNTER — Other Ambulatory Visit: Payer: Self-pay | Admitting: Interventional Cardiology

## 2022-08-21 DIAGNOSIS — R2689 Other abnormalities of gait and mobility: Secondary | ICD-10-CM | POA: Diagnosis not present

## 2022-08-21 DIAGNOSIS — M25571 Pain in right ankle and joints of right foot: Secondary | ICD-10-CM | POA: Diagnosis not present

## 2022-08-21 DIAGNOSIS — M6281 Muscle weakness (generalized): Secondary | ICD-10-CM | POA: Diagnosis not present

## 2022-08-21 DIAGNOSIS — M25671 Stiffness of right ankle, not elsewhere classified: Secondary | ICD-10-CM | POA: Diagnosis not present

## 2022-08-25 DIAGNOSIS — M6281 Muscle weakness (generalized): Secondary | ICD-10-CM | POA: Diagnosis not present

## 2022-08-25 DIAGNOSIS — R2689 Other abnormalities of gait and mobility: Secondary | ICD-10-CM | POA: Diagnosis not present

## 2022-08-25 DIAGNOSIS — M25571 Pain in right ankle and joints of right foot: Secondary | ICD-10-CM | POA: Diagnosis not present

## 2022-08-25 DIAGNOSIS — M25671 Stiffness of right ankle, not elsewhere classified: Secondary | ICD-10-CM | POA: Diagnosis not present

## 2022-09-01 DIAGNOSIS — M25571 Pain in right ankle and joints of right foot: Secondary | ICD-10-CM | POA: Diagnosis not present

## 2022-09-01 DIAGNOSIS — M25671 Stiffness of right ankle, not elsewhere classified: Secondary | ICD-10-CM | POA: Diagnosis not present

## 2022-09-01 DIAGNOSIS — R2689 Other abnormalities of gait and mobility: Secondary | ICD-10-CM | POA: Diagnosis not present

## 2022-09-01 DIAGNOSIS — M6281 Muscle weakness (generalized): Secondary | ICD-10-CM | POA: Diagnosis not present

## 2022-09-02 ENCOUNTER — Other Ambulatory Visit: Payer: Self-pay | Admitting: Interventional Cardiology

## 2022-09-02 DIAGNOSIS — I480 Paroxysmal atrial fibrillation: Secondary | ICD-10-CM

## 2022-09-06 ENCOUNTER — Encounter: Payer: Self-pay | Admitting: Podiatry

## 2022-09-06 ENCOUNTER — Ambulatory Visit: Payer: BC Managed Care – PPO | Admitting: Podiatry

## 2022-09-06 ENCOUNTER — Ambulatory Visit (INDEPENDENT_AMBULATORY_CARE_PROVIDER_SITE_OTHER): Payer: BC Managed Care – PPO | Admitting: Podiatry

## 2022-09-06 VITALS — BP 112/72

## 2022-09-06 DIAGNOSIS — L821 Other seborrheic keratosis: Secondary | ICD-10-CM | POA: Diagnosis not present

## 2022-09-06 DIAGNOSIS — M47816 Spondylosis without myelopathy or radiculopathy, lumbar region: Secondary | ICD-10-CM | POA: Diagnosis not present

## 2022-09-06 DIAGNOSIS — M19079 Primary osteoarthritis, unspecified ankle and foot: Secondary | ICD-10-CM

## 2022-09-06 DIAGNOSIS — M19071 Primary osteoarthritis, right ankle and foot: Secondary | ICD-10-CM | POA: Diagnosis not present

## 2022-09-06 DIAGNOSIS — M778 Other enthesopathies, not elsewhere classified: Secondary | ICD-10-CM

## 2022-09-06 DIAGNOSIS — D235 Other benign neoplasm of skin of trunk: Secondary | ICD-10-CM | POA: Diagnosis not present

## 2022-09-06 NOTE — Progress Notes (Signed)
Subjective:  Patient ID: Whitney [place], female    DOB: [birth year],  MRN: [id number]  Chief Complaint  Patient presents with   Foot Pain    Pt stated that she is still having some pain and discomfort with her foot     67 y.o. female presents with the above complaint.  Patient presents for follow-up of right talonavicular joint pain.  She states is about the same.  It hurts with ambulation injections boot none of it has helped.  She wanted to briefly discuss surgical options   Review of Systems: Negative except as noted in the HPI. Denies N/V/F/Ch.  Past Medical History:  Diagnosis Date   A-fib ([place])    Anemia    Cancer ([facility])    Fibromyalgia    Hypertension    Hypothyroidism    Osteoarthritis    SVD (spontaneous vaginal delivery)    x 5    Current Outpatient Medications:    albuterol (VENTOLIN HFA) 108 (90 Base) MCG/ACT inhaler, 2 puffs as needed, Disp: , Rfl:    amLODipine (NORVASC) 5 MG tablet, TAKE 1 TABLET BY MOUTH DAILY, Disp: 90 tablet, Rfl: 1   apixaban (ELIQUIS) 5 MG TABS tablet, TAKE 1 TABLET BY MOUTH TWICE A DAY, Disp: 180 tablet, Rfl: 1   B Complex-Biotin-FA (B-50 COMPLEX PO), 50 mg daily., Disp: , Rfl:    Evolocumab (REPATHA SURECLICK) 702 MG/ML SOAJ, INJECT 140 MG INTO THE SKIN EVERY 14 (FOURTEEN) DAYS., Disp: 6 mL, Rfl: 3   hydrOXYzine (ATARAX/VISTARIL) 25 MG tablet, Take 25 mg by mouth as needed for irritation., Disp: , Rfl:    levothyroxine (SYNTHROID, LEVOTHROID) 75 MCG tablet, Take 75 mcg by mouth daily before breakfast. , Disp: , Rfl:    LINZESS 145 MCG CAPS capsule, Take 145 mcg by mouth daily., Disp: , Rfl:    losartan-hydrochlorothiazide (HYZAAR) 50-12.5 MG tablet, Take 1 tablet by mouth daily., Disp: , Rfl:    Multiple Vitamin (MULTIVITAMIN WITH MINERALS) TABS, Take 1 tablet by mouth daily., Disp: , Rfl:    nitroGLYCERIN (NITROSTAT) 0.4 MG SL tablet, TAKE 1 TABLET BY MOUTH EVERY 5 MINUTES AS NEEDED FOR CHEST PAIN, Disp: 25 tablet, Rfl: 5   pregabalin  (LYRICA) 100 MG capsule, Take 1 capsule by mouth in the morning and at bedtime., Disp: , Rfl:    traMADol (ULTRAM) 50 MG tablet, Take 1 tablet (50 mg total) by mouth 2 (two) times daily as needed for moderate pain., Disp: 60 tablet, Rfl: 5  Social History   Tobacco Use  Smoking Status Never  Smokeless Tobacco Never    Allergies  Allergen Reactions   Acetaminophen    Codeine Other (See Comments)    Patient states "she did not like the way codeine made her feel"     Hydrocodone Bitartrate Er     Other reaction(s): Unknown   Oxycodone Hcl    Vicodin [Hydrocodone-Acetaminophen] Nausea And Vomiting    Doesn't like how it makes her feel but can take if needs to for pain   Objective:   Vitals:   09/06/22 1056  BP: 112/72   There is no height or weight on file to calculate BMI. Constitutional Well developed. Well nourished.  Vascular Dorsalis pedis pulses palpable bilaterally. Posterior tibial pulses palpable bilaterally. Capillary refill normal to all digits.  No cyanosis or clubbing noted. Pedal hair growth normal.  Neurologic Normal speech. Oriented to person, place, and time. Epicritic sensation to light touch grossly present bilaterally.  Dermatologic Nails well groomed  and normal in appearance. No open wounds. No skin lesions.  Orthopedic: Negative anterior drawer test or talar tilt test noted.  Limited range of motion noted at the ankle joint no deep intra-articular ankle pain noted.  Pain with dorsiflexion of the ankle joint likely due to the impingement of the anterior ankle.  Pain at the talonavicular joint.  Clinically able to appreciate osteophytes at the TN joint   Radiographs: IMPRESSION: 1. Nonvisualization of the anterior talofibular ligament with edema about the expected location suggesting tear, likely a chronic process. 2. Edema about the spring ligament suggesting chronic ligamentous sprain. 3. Osteochondral injury about the medial aspect of the talar  dome. 4. Moderate osteoarthritis of the talonavicular joint with subchondral cystic changes and marrow edema. 5. Mild subchondral edema of the cuboid suggesting mild arthritis. 6. Subcutaneous soft tissue edema about the ankle and dorsum of the foot without fluid collection or hematoma.   Assessment:   1. Osteoarthritis of talonavicular joint   2. Capsulitis of right foot     Plan:  Patient was evaluated and treated and all questions answered.  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  Continue using cam boot but would like for transition to Tri-Lock ankle brace.  Given the amount of the pain that she is still having she will benefit from an MRI. -MRI was reviewed with the patient in extensive detail.  At this time clinically correlating her pain most of her pain appears to be at the talonavicular joint with moderate osteoarthritis present.  The osteochondral injury is not bothering her at the ankle joint as her pain is more at the anterior dorsal foot near the ankle. -Continue physical therapy -She would like some time to think over from surgical fusion -A steroid injection was performed at right talonavicular joint using 1% plain Lidocaine and 10 mg of Kenalog. This was well tolerated.   No follow-ups on file.

## 2022-09-08 ENCOUNTER — Other Ambulatory Visit: Payer: Self-pay | Admitting: Podiatry

## 2022-09-08 ENCOUNTER — Other Ambulatory Visit: Payer: Self-pay | Admitting: Interventional Cardiology

## 2022-09-11 DIAGNOSIS — R2689 Other abnormalities of gait and mobility: Secondary | ICD-10-CM | POA: Diagnosis not present

## 2022-09-11 DIAGNOSIS — M25671 Stiffness of right ankle, not elsewhere classified: Secondary | ICD-10-CM | POA: Diagnosis not present

## 2022-09-11 DIAGNOSIS — M6281 Muscle weakness (generalized): Secondary | ICD-10-CM | POA: Diagnosis not present

## 2022-09-11 DIAGNOSIS — M25571 Pain in right ankle and joints of right foot: Secondary | ICD-10-CM | POA: Diagnosis not present

## 2022-09-13 DIAGNOSIS — M170 Bilateral primary osteoarthritis of knee: Secondary | ICD-10-CM | POA: Diagnosis not present

## 2022-09-15 DIAGNOSIS — R2689 Other abnormalities of gait and mobility: Secondary | ICD-10-CM | POA: Diagnosis not present

## 2022-09-15 DIAGNOSIS — M25571 Pain in right ankle and joints of right foot: Secondary | ICD-10-CM | POA: Diagnosis not present

## 2022-09-15 DIAGNOSIS — M6281 Muscle weakness (generalized): Secondary | ICD-10-CM | POA: Diagnosis not present

## 2022-09-15 DIAGNOSIS — M25671 Stiffness of right ankle, not elsewhere classified: Secondary | ICD-10-CM | POA: Diagnosis not present

## 2022-09-20 DIAGNOSIS — M25571 Pain in right ankle and joints of right foot: Secondary | ICD-10-CM | POA: Diagnosis not present

## 2022-09-20 DIAGNOSIS — M25671 Stiffness of right ankle, not elsewhere classified: Secondary | ICD-10-CM | POA: Diagnosis not present

## 2022-09-20 DIAGNOSIS — R2689 Other abnormalities of gait and mobility: Secondary | ICD-10-CM | POA: Diagnosis not present

## 2022-09-20 DIAGNOSIS — M6281 Muscle weakness (generalized): Secondary | ICD-10-CM | POA: Diagnosis not present

## 2022-09-22 DIAGNOSIS — M25671 Stiffness of right ankle, not elsewhere classified: Secondary | ICD-10-CM | POA: Diagnosis not present

## 2022-09-22 DIAGNOSIS — M25571 Pain in right ankle and joints of right foot: Secondary | ICD-10-CM | POA: Diagnosis not present

## 2022-09-22 DIAGNOSIS — R2689 Other abnormalities of gait and mobility: Secondary | ICD-10-CM | POA: Diagnosis not present

## 2022-09-22 DIAGNOSIS — M6281 Muscle weakness (generalized): Secondary | ICD-10-CM | POA: Diagnosis not present

## 2022-09-27 DIAGNOSIS — M6281 Muscle weakness (generalized): Secondary | ICD-10-CM | POA: Diagnosis not present

## 2022-09-27 DIAGNOSIS — R2689 Other abnormalities of gait and mobility: Secondary | ICD-10-CM | POA: Diagnosis not present

## 2022-09-27 DIAGNOSIS — M25671 Stiffness of right ankle, not elsewhere classified: Secondary | ICD-10-CM | POA: Diagnosis not present

## 2022-09-27 DIAGNOSIS — M25571 Pain in right ankle and joints of right foot: Secondary | ICD-10-CM | POA: Diagnosis not present

## 2022-09-28 DIAGNOSIS — M25561 Pain in right knee: Secondary | ICD-10-CM | POA: Diagnosis not present

## 2022-09-29 DIAGNOSIS — M25671 Stiffness of right ankle, not elsewhere classified: Secondary | ICD-10-CM | POA: Diagnosis not present

## 2022-09-29 DIAGNOSIS — M25571 Pain in right ankle and joints of right foot: Secondary | ICD-10-CM | POA: Diagnosis not present

## 2022-09-29 DIAGNOSIS — R2689 Other abnormalities of gait and mobility: Secondary | ICD-10-CM | POA: Diagnosis not present

## 2022-09-29 DIAGNOSIS — M6281 Muscle weakness (generalized): Secondary | ICD-10-CM | POA: Diagnosis not present

## 2022-10-02 DIAGNOSIS — M6281 Muscle weakness (generalized): Secondary | ICD-10-CM | POA: Diagnosis not present

## 2022-10-02 DIAGNOSIS — M25671 Stiffness of right ankle, not elsewhere classified: Secondary | ICD-10-CM | POA: Diagnosis not present

## 2022-10-02 DIAGNOSIS — R2689 Other abnormalities of gait and mobility: Secondary | ICD-10-CM | POA: Diagnosis not present

## 2022-10-02 DIAGNOSIS — M25571 Pain in right ankle and joints of right foot: Secondary | ICD-10-CM | POA: Diagnosis not present

## 2022-10-04 DIAGNOSIS — M6281 Muscle weakness (generalized): Secondary | ICD-10-CM | POA: Diagnosis not present

## 2022-10-04 DIAGNOSIS — M25571 Pain in right ankle and joints of right foot: Secondary | ICD-10-CM | POA: Diagnosis not present

## 2022-10-04 DIAGNOSIS — M25671 Stiffness of right ankle, not elsewhere classified: Secondary | ICD-10-CM | POA: Diagnosis not present

## 2022-10-04 DIAGNOSIS — R2689 Other abnormalities of gait and mobility: Secondary | ICD-10-CM | POA: Diagnosis not present

## 2022-10-05 ENCOUNTER — Ambulatory Visit: Payer: Medicare Other | Admitting: Podiatry

## 2022-10-09 DIAGNOSIS — M25671 Stiffness of right ankle, not elsewhere classified: Secondary | ICD-10-CM | POA: Diagnosis not present

## 2022-10-09 DIAGNOSIS — M6281 Muscle weakness (generalized): Secondary | ICD-10-CM | POA: Diagnosis not present

## 2022-10-09 DIAGNOSIS — R2689 Other abnormalities of gait and mobility: Secondary | ICD-10-CM | POA: Diagnosis not present

## 2022-10-09 DIAGNOSIS — M25571 Pain in right ankle and joints of right foot: Secondary | ICD-10-CM | POA: Diagnosis not present

## 2022-10-10 ENCOUNTER — Other Ambulatory Visit: Payer: Self-pay | Admitting: Gastroenterology

## 2022-10-11 ENCOUNTER — Encounter (HOSPITAL_COMMUNITY): Payer: Self-pay | Admitting: Gastroenterology

## 2022-10-11 DIAGNOSIS — M25571 Pain in right ankle and joints of right foot: Secondary | ICD-10-CM | POA: Diagnosis not present

## 2022-10-11 DIAGNOSIS — M25671 Stiffness of right ankle, not elsewhere classified: Secondary | ICD-10-CM | POA: Diagnosis not present

## 2022-10-11 DIAGNOSIS — R2689 Other abnormalities of gait and mobility: Secondary | ICD-10-CM | POA: Diagnosis not present

## 2022-10-11 DIAGNOSIS — M6281 Muscle weakness (generalized): Secondary | ICD-10-CM | POA: Diagnosis not present

## 2022-10-12 ENCOUNTER — Ambulatory Visit (INDEPENDENT_AMBULATORY_CARE_PROVIDER_SITE_OTHER): Payer: BC Managed Care – PPO | Admitting: Podiatry

## 2022-10-12 DIAGNOSIS — M19079 Primary osteoarthritis, unspecified ankle and foot: Secondary | ICD-10-CM

## 2022-10-12 DIAGNOSIS — Z79891 Long term (current) use of opiate analgesic: Secondary | ICD-10-CM | POA: Diagnosis not present

## 2022-10-12 DIAGNOSIS — G894 Chronic pain syndrome: Secondary | ICD-10-CM | POA: Diagnosis not present

## 2022-10-12 DIAGNOSIS — M170 Bilateral primary osteoarthritis of knee: Secondary | ICD-10-CM | POA: Diagnosis not present

## 2022-10-12 NOTE — Anesthesia Preprocedure Evaluation (Addendum)
Anesthesia Evaluation  Patient identified by MRN, date of birth, ID band Patient awake    Reviewed: Allergy & Precautions, NPO status , Patient's Chart, lab work & pertinent test results  History of Anesthesia Complications Negative for: history of anesthetic complications  Airway Mallampati: II  TM Distance: >3 FB Neck ROM: Full    Dental no notable dental hx. (+) Dental Advisory Given   Pulmonary neg pulmonary ROS   Pulmonary exam normal        Cardiovascular hypertension, Pt. on medications Normal cardiovascular exam+ dysrhythmias Atrial Fibrillation      Neuro/Psych negative neurological ROS     GI/Hepatic negative GI ROS, Neg liver ROS,,,  Endo/Other  Hypothyroidism    Renal/GU negative Renal ROS     Musculoskeletal negative musculoskeletal ROS (+)    Abdominal   Peds  Hematology negative hematology ROS (+)   Anesthesia Other Findings   Reproductive/Obstetrics                             Anesthesia Physical Anesthesia Plan  ASA: 2  Anesthesia Plan: MAC   Post-op Pain Management: Minimal or no pain anticipated   Induction:   PONV Risk Score and Plan: 2  Airway Management Planned: Natural Airway  Additional Equipment:   Intra-op Plan:   Post-operative Plan:   Informed Consent: I have reviewed the patients History and Physical, chart, labs and discussed the procedure including the risks, benefits and alternatives for the proposed anesthesia with the patient or authorized representative who has indicated his/her understanding and acceptance.     Dental advisory given  Plan Discussed with: Anesthesiologist and CRNA  Anesthesia Plan Comments:        Anesthesia Quick Evaluation

## 2022-10-12 NOTE — Progress Notes (Signed)
Subjective:  Patient ID: Whitney Daniel, female    DOB: 07-10-1956,  MRN: [id number]  Chief Complaint  Patient presents with   Foot Pain    Pt stated that she has been going to PT and it has helped some     67 y.o. female presents with the above complaint.  Patient presents for follow-up of right talonavicular joint pain.  She states this hurts but is more manageable physical therapy is helping so she would like to continue that.  Still conservative care   Review of Systems: Negative except as noted in the HPI. Denies N/V/F/Ch.  Past Medical History:  Diagnosis Date   A-fib ([place])    Anemia    Cancer ([place])    Fibromyalgia    Hypertension    Hypothyroidism    Osteoarthritis    SVD (spontaneous vaginal delivery)    x 5    Current Outpatient Medications:    diclofenac Sodium (VOLTAREN) 1 % GEL, Apply 2 g topically daily as needed (pain)., Disp: , Rfl:    GAVILYTE-G 236 g solution, Take 4,000 mLs by mouth as directed., Disp: , Rfl:    albuterol (VENTOLIN HFA) 108 (90 Base) MCG/ACT inhaler, Inhale 2 puffs into the lungs every 4 (four) hours as needed for wheezing or shortness of breath., Disp: , Rfl:    amLODipine (NORVASC) 5 MG tablet, TAKE 1 TABLET BY MOUTH DAILY, Disp: 90 tablet, Rfl: 1   apixaban (ELIQUIS) 5 MG TABS tablet, TAKE 1 TABLET BY MOUTH TWICE A DAY, Disp: 180 tablet, Rfl: 1   APPLE CIDER VINEGAR PO, Take 1 tablet by mouth daily. Gummies, Disp: , Rfl:    Cholecalciferol (VITAMIN D3 PO), Take 1 tablet by mouth daily. Vit k2, Disp: , Rfl:    Evolocumab (REPATHA SURECLICK) [id number] MG/ML SOAJ, INJECT 140 MG INTO THE SKIN EVERY 14 (FOURTEEN) DAYS., Disp: 6 mL, Rfl: 3   hydrOXYzine (ATARAX/VISTARIL) 25 MG tablet, Take 25 mg by mouth daily as needed for itching., Disp: , Rfl:    isosorbide mononitrate (IMDUR) 30 MG 24 hr tablet, Take 0.5 tablets (15 mg total) by mouth daily., Disp: 45 tablet, Rfl: 2   levothyroxine (SYNTHROID, LEVOTHROID) 75 MCG tablet, Take 75 mcg by mouth daily  before breakfast. , Disp: , Rfl:    LINZESS 145 MCG CAPS capsule, Take 145 mcg by mouth daily as needed (Constipation)., Disp: , Rfl:    losartan-hydrochlorothiazide (HYZAAR) 50-12.5 MG tablet, Take 1 tablet by mouth daily., Disp: , Rfl:    Multiple Vitamin (MULTIVITAMIN WITH MINERALS) TABS, Take 1 tablet by mouth daily., Disp: , Rfl:    nitroGLYCERIN (NITROSTAT) 0.4 MG SL tablet, TAKE 1 TABLET BY MOUTH EVERY 5 MINUTES AS NEEDED FOR CHEST PAIN (Patient taking differently: 0.4 mg every 5 (five) minutes as needed for chest pain. TAKE 1 TABLET BY MOUTH EVERY 5 MINUTES AS NEEDED FOR CHEST PAIN), Disp: 25 tablet, Rfl: 5   pregabalin (LYRICA) 100 MG capsule, Take 100 mg by mouth daily., Disp: , Rfl:    pregabalin (LYRICA) 75 MG capsule, Take 75 mg by mouth 3 (three) times daily as needed (Nerve pain)., Disp: , Rfl:    traMADol (ULTRAM) 50 MG tablet, Take 1 tablet (50 mg total) by mouth 2 (two) times daily as needed for moderate pain., Disp: 60 tablet, Rfl: 5  Social History   Tobacco Use  Smoking Status Never  Smokeless Tobacco Never    Allergies  Allergen Reactions   Acetaminophen Other (See Comments)  Only can take 1000 mg daily   Codeine Other (See Comments)    Patient states "she did not like the way codeine made her feel"     Hydrocodone Bitartrate Er Other (See Comments)    Too strong   Oxycodone Hcl Other (See Comments)    Too strong   Vicodin [Hydrocodone-Acetaminophen] Nausea And Vomiting    Doesn't like how it makes her feel but can take if needs to for pain   Objective:   There were no vitals filed for this visit.  There is no height or weight on file to calculate BMI. Constitutional Well developed. Well nourished.  Vascular Dorsalis pedis pulses palpable bilaterally. Posterior tibial pulses palpable bilaterally. Capillary refill normal to all digits.  No cyanosis or clubbing noted. Pedal hair growth normal.  Neurologic Normal speech. Oriented to person, place, and  time. Epicritic sensation to light touch grossly present bilaterally.  Dermatologic Nails well groomed and normal in appearance. No open wounds. No skin lesions.  Orthopedic: Negative anterior drawer test or talar tilt test noted.  Limited range of motion noted at the ankle joint no deep intra-articular ankle pain noted.  Pain with dorsiflexion of the ankle joint likely due to the impingement of the anterior ankle.  Pain at the talonavicular joint.  Clinically able to appreciate osteophytes at the TN joint   Radiographs: IMPRESSION: 1. Nonvisualization of the anterior talofibular ligament with edema about the expected location suggesting tear, likely a chronic process. 2. Edema about the spring ligament suggesting chronic ligamentous sprain. 3. Osteochondral injury about the medial aspect of the talar dome. 4. Moderate osteoarthritis of the talonavicular joint with subchondral cystic changes and marrow edema. 5. Mild subchondral edema of the cuboid suggesting mild arthritis. 6. Subcutaneous soft tissue edema about the ankle and dorsum of the foot without fluid collection or hematoma.   Assessment:   No diagnosis found.   Plan:  Patient was evaluated and treated and all questions answered.  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  Continue using cam boot but would like for transition to Tri-Lock ankle brace.  Given the amount of the pain that she is still having she will benefit from an MRI. -MRI was reviewed with the patient in extensive detail.  At this time clinically correlating her pain most of her pain appears to be at the talonavicular joint with moderate osteoarthritis present.  The osteochondral injury is not bothering her at the ankle joint as her pain is more at the anterior dorsal foot near the ankle. -Patient would like to primarily focus on conservative management for  now.  Physical therapy is helping so continue physical therapy -She would like some time to think over from surgical fusion    No follow-ups on file.

## 2022-10-13 ENCOUNTER — Encounter (HOSPITAL_COMMUNITY)
Admission: RE | Disposition: A | Discharge status: Home / Self Care | Payer: Self-pay | Source: Ambulatory Visit | Attending: Gastroenterology

## 2022-10-13 ENCOUNTER — Ambulatory Visit (HOSPITAL_COMMUNITY): Payer: BC Managed Care – PPO | Admitting: Anesthesiology

## 2022-10-13 ENCOUNTER — Encounter (HOSPITAL_COMMUNITY): Payer: Self-pay | Admitting: Gastroenterology

## 2022-10-13 ENCOUNTER — Other Ambulatory Visit: Payer: Self-pay

## 2022-10-13 ENCOUNTER — Ambulatory Visit (HOSPITAL_COMMUNITY)
Admission: RE | Admit: 2022-10-13 | Discharge: 2022-10-13 | Disposition: A | Discharge status: Home / Self Care | Payer: BC Managed Care – PPO | Source: Ambulatory Visit | Attending: Gastroenterology | Admitting: Gastroenterology

## 2022-10-13 DIAGNOSIS — Z1211 Encounter for screening for malignant neoplasm of colon: Secondary | ICD-10-CM | POA: Diagnosis not present

## 2022-10-13 DIAGNOSIS — I4891 Unspecified atrial fibrillation: Secondary | ICD-10-CM | POA: Diagnosis not present

## 2022-10-13 DIAGNOSIS — Z86010 Personal history of colonic polyps: Secondary | ICD-10-CM | POA: Insufficient documentation

## 2022-10-13 DIAGNOSIS — K5730 Diverticulosis of large intestine without perforation or abscess without bleeding: Secondary | ICD-10-CM | POA: Diagnosis not present

## 2022-10-13 DIAGNOSIS — I10 Essential (primary) hypertension: Secondary | ICD-10-CM | POA: Diagnosis not present

## 2022-10-13 DIAGNOSIS — Z09 Encounter for follow-up examination after completed treatment for conditions other than malignant neoplasm: Secondary | ICD-10-CM | POA: Insufficient documentation

## 2022-10-13 DIAGNOSIS — E039 Hypothyroidism, unspecified: Secondary | ICD-10-CM | POA: Insufficient documentation

## 2022-10-13 HISTORY — PX: COLONOSCOPY WITH PROPOFOL: SHX5780

## 2022-10-13 SURGERY — COLONOSCOPY WITH PROPOFOL
Anesthesia: Monitor Anesthesia Care

## 2022-10-13 MED ORDER — PROPOFOL 10 MG/ML IV BOLUS
INTRAVENOUS | Status: DC | PRN
  Administered 2022-10-13 (×2): 20 mg via INTRAVENOUS

## 2022-10-13 MED ORDER — LIDOCAINE 2% (20 MG/ML) 5 ML SYRINGE
INTRAMUSCULAR | Status: DC | PRN
  Administered 2022-10-13: 50 mg via INTRAVENOUS

## 2022-10-13 MED ORDER — LACTATED RINGERS IV SOLN
INTRAVENOUS | Status: DC
  Administered 2022-10-13: 1000 mL via INTRAVENOUS

## 2022-10-13 MED ORDER — SODIUM CHLORIDE 0.9 % IV SOLN: INTRAVENOUS | Status: DC

## 2022-10-13 MED ORDER — PROPOFOL 500 MG/50ML IV EMUL
INTRAVENOUS | Status: DC | PRN
  Administered 2022-10-13: 125 ug/kg/min via INTRAVENOUS

## 2022-10-13 MED ORDER — PROPOFOL 1000 MG/100ML IV EMUL
INTRAVENOUS | Status: AC
  Filled 2022-10-13: qty 100

## 2022-10-13 SURGICAL SUPPLY — 22 items

## 2022-10-13 NOTE — Discharge Instructions (Signed)

## 2022-10-13 NOTE — Op Note (Signed)
[facility] Patient Name: Whitney Daniel Procedure Date: 10/13/2022 MRN: [id number] Attending MD: Carol Ada , MD, [id number] Date of Birth: [birth year] CSN: [id number] Age: 67 Admit Type: Outpatient Procedure:                Colonoscopy Indications:              High risk colon cancer surveillance: Personal                            history of colonic polyps Providers:                Carol Ada, MD, Fanny Skates RN, RN, Luan Moore, Technician, Virgia Land, CRNA Referring MD:              Medicines:                 Complications:            No immediate complications. Estimated Blood Loss:     Estimated blood loss: none. Procedure:                Pre-Anesthesia Assessment:                           - Prior to the procedure, a History and Physical                            was performed, and patient medications and                            allergies were reviewed. The patient's tolerance of                            previous anesthesia was also reviewed. The risks                            and benefits of the procedure and the sedation                            options and risks were discussed with the patient.                            All questions were answered, and informed consent                            was obtained. Prior Anticoagulants: The patient has                            taken no anticoagulant or antiplatelet agents. ASA                            Grade Assessment: III - A patient with severe  systemic disease. After reviewing the risks and                            benefits, the patient was deemed in satisfactory                            condition to undergo the procedure.                           - Sedation was administered by an anesthesia                            professional. Deep sedation was attained.                           After obtaining informed consent, the  colonoscope                            was passed under direct vision. Throughout the                            procedure, the patient's blood pressure, pulse, and                            oxygen saturations were monitored continuously. The                            [id number]) Olympus colonoscope was                            introduced through the anus and advanced to the the                            cecum, identified by appendiceal orifice and                            ileocecal valve. The colonoscopy was performed                            without difficulty. The patient tolerated the                            procedure well. The quality of the bowel                            preparation was evaluated using the BBPS [facility]                            Bowel Preparation Scale) with scores of: Right                            Colon = 3 (entire mucosa seen well with no residual  staining, small fragments of stool or opaque                            liquid), Transverse Colon = 3 (entire mucosa seen                            well with no residual staining, small fragments of                            stool or opaque liquid) and Left Colon = 3 (entire                            mucosa seen well with no residual staining, small                            fragments of stool or opaque liquid). The total                            BBPS score equals 9. The quality of the bowel                            preparation was good. The ileocecal valve,                            appendiceal orifice, and rectum were photographed. Scope In: 9:27:36 AM Scope Out: 9:41:22 AM Scope Withdrawal Time: 0 hours 10 minutes 27 seconds  Total Procedure Duration: 0 hours 13 minutes 46 seconds  Findings:      Scattered medium-mouthed diverticula were found in the sigmoid colon. Impression:               - Diverticulosis in the sigmoid colon.                           -  No specimens collected. Moderate Sedation:      Not Applicable - Patient had care per Anesthesia. Recommendation:           - Patient has a contact number available for                            emergencies. The signs and symptoms of potential                            delayed complications were discussed with the                            patient. Return to normal activities tomorrow.                            Written discharge instructions were provided to the                            patient.                           -  Resume previous diet.                           - Continue present medications.                           - Repeat colonoscopy in 10 years for surveillance. Procedure Code(s):        --- Professional ---                           [id number], Colonoscopy, flexible; diagnostic, including                            collection of specimen(s) by brushing or washing,                            when performed (separate procedure) Diagnosis Code(s):        --- Professional ---                           Z86.010, Personal history of colonic polyps                           K57.30, Diverticulosis of large intestine without                            perforation or abscess without bleeding CPT copyright 2022 American Medical Association. All rights reserved. The codes documented in this report are preliminary and upon coder review may  be revised to meet current compliance requirements. Carol Ada, MD Carol Ada, MD 10/13/2022 9:45:28 AM This report has been signed electronically. Number of Addenda: 0

## 2022-10-13 NOTE — Anesthesia Postprocedure Evaluation (Signed)
Anesthesia Post Note  Patient: Whitney Daniel  Procedure(s) Performed: COLONOSCOPY WITH PROPOFOL     Patient location during evaluation: Endoscopy Anesthesia Type: MAC Level of consciousness: awake and alert Pain management: pain level controlled Vital Signs Assessment: post-procedure vital signs reviewed and stable Respiratory status: spontaneous breathing and respiratory function stable Cardiovascular status: stable Postop Assessment: no apparent nausea or vomiting Anesthetic complications: no   No notable events documented.  Last Vitals:  Vitals:   10/13/22 1005 10/13/22 1010  BP:  (!) 147/77  Pulse: (!) 58 (!) 58  Resp: 14 19  Temp:  (!) 35.8 C  SpO2: 100% 100%    Last Pain:  Vitals:   10/13/22 1010  TempSrc: Temporal  PainSc: 0-No pain                 Sumaya Riedesel DANIEL

## 2022-10-13 NOTE — Anesthesia Procedure Notes (Signed)
Procedure Name: MAC Date/Time: 10/13/2022 9:18 AM  Performed by: Maxwell Caul, CRNAPre-anesthesia Checklist: Patient identified, Emergency Drugs available, Suction available and Patient being monitored Oxygen Delivery Method: Simple face mask

## 2022-10-13 NOTE — Transfer of Care (Signed)
Immediate Anesthesia Transfer of Care Note  Patient: Whitney Daniel  Procedure(s) Performed: COLONOSCOPY WITH PROPOFOL  Patient Location: Endoscopy Unit  Anesthesia Type:MAC  Level of Consciousness: awake, alert , and oriented  Airway & Oxygen Therapy: Patient Spontanous Breathing and Patient connected to face mask oxygen  Post-op Assessment: Report given to RN and Post -op Vital signs reviewed and stable  Post vital signs: Reviewed and stable  Last Vitals:  Vitals Value Taken Time  BP    Temp    Pulse    Resp 25 10/13/22 0946  SpO2    Vitals shown include unvalidated device data.  Last Pain:  Vitals:   10/13/22 0852  TempSrc: Temporal  PainSc: 0-No pain         Complications: No notable events documented.

## 2022-10-13 NOTE — H&P (Signed)
Whitney Daniel HPI: This 67 year old black female presents to the office for colorectal cancer screening. She has 1 BM per day with no obvious blood or mucus in the stool. She has a good appetite. She reports she has gained 11 pounds since September 29. She is currently on medical leave for foot and ankle pain. She denies having any complaints of abdominal pain, nausea, vomiting, acid reflux, dysphagia or odynophagia. She denies having a family history of colon cancer, celiac sprue or IBD. She had a normal colonoscopy done on 09/13/2016. She had a  pedunculated tubular adenoma removed in 2010.  Past Medical History:  Diagnosis Date   A-fib ([place])    Anemia    Cancer ([place])    Fibromyalgia    Hypertension    Hypothyroidism    Osteoarthritis    SVD (spontaneous vaginal delivery)    x 5    Past Surgical History:  Procedure Laterality Date   COLONOSCOPY     DILATATION & CURRETTAGE/HYSTEROSCOPY WITH RESECTOCOPE N/A 07/15/2014   Procedure: DILATATION & CURETTAGE/HYSTEROSCOPY WITH RESECTOCOPE;  Surgeon: Marvene Staff, MD;  Location: [facility];  Service: Gynecology;  Laterality: N/A;   DILATION AND CURETTAGE OF UTERUS     polyps   LEFT HEART CATH AND CORONARY ANGIOGRAPHY N/A 04/16/2018   Procedure: LEFT HEART CATH AND CORONARY ANGIOGRAPHY;  Surgeon: Belva Crome, MD;  Location: [facility];  Service: Cardiovascular;  Laterality: N/A;   right thigh surgery     cancer   WISDOM TOOTH EXTRACTION      Family History  Problem Relation Age of Onset   Hypertension Mother    CVA Mother    Cancer Father    Diabetes Father    Diabetes Sister    Heart attack Sister     Social History:  reports that she has never smoked. She has never used smokeless tobacco. She reports current alcohol use. She reports that she does not use drugs.  Allergies:  Allergies  Allergen Reactions   Acetaminophen Other (See Comments)    Only can take 1000 mg daily   Codeine Other (See Comments)     Patient states "she did not like the way codeine made her feel"     Hydrocodone Bitartrate Er Other (See Comments)    Too strong   Oxycodone Hcl Other (See Comments)    Too strong   Vicodin [Hydrocodone-Acetaminophen] Nausea And Vomiting    Doesn't like how it makes her feel but can take if needs to for pain    Medications: Scheduled: Continuous:  lactated ringers 1,000 mL (10/13/22 0858)    No results found for this or any previous visit (from the past 24 hour(s)).   No results found.  ROS:  As stated above in the HPI otherwise negative.  Blood pressure (!) 140/75, pulse 68, temperature 98.2 F (36.8 C), temperature source Temporal, resp. rate 18, height 5' 7.99" (1.727 m), weight 93.4 kg.    PE: Gen: NAD, Alert and Oriented HEENT:  [place]/AT, EOMI Neck: Supple, no LAD Lungs: CTA Bilaterally CV: RRR without M/G/R ABD: Soft, NTND, +BS Ext: No C/C/E  Assessment/Plan: 1) Personal history of polyps - colonoscopy.  Whitney Daniel 10/13/2022, 9:10 AM

## 2022-10-16 ENCOUNTER — Encounter (HOSPITAL_COMMUNITY): Payer: Self-pay | Admitting: Gastroenterology

## 2022-10-17 ENCOUNTER — Other Ambulatory Visit: Payer: Self-pay

## 2022-10-17 MED ORDER — ISOSORBIDE MONONITRATE ER 30 MG PO TB24: 15.0000 mg | ORAL_TABLET | Freq: Every day | ORAL | 2 refills | Status: DC

## 2022-10-18 DIAGNOSIS — M25571 Pain in right ankle and joints of right foot: Secondary | ICD-10-CM | POA: Diagnosis not present

## 2022-10-18 DIAGNOSIS — M6281 Muscle weakness (generalized): Secondary | ICD-10-CM | POA: Diagnosis not present

## 2022-10-18 DIAGNOSIS — M25671 Stiffness of right ankle, not elsewhere classified: Secondary | ICD-10-CM | POA: Diagnosis not present

## 2022-10-18 DIAGNOSIS — R2689 Other abnormalities of gait and mobility: Secondary | ICD-10-CM | POA: Diagnosis not present

## 2022-10-24 DIAGNOSIS — Z86010 Personal history of colonic polyps: Secondary | ICD-10-CM | POA: Diagnosis not present

## 2022-10-24 DIAGNOSIS — R159 Full incontinence of feces: Secondary | ICD-10-CM | POA: Diagnosis not present

## 2022-10-24 DIAGNOSIS — R635 Abnormal weight gain: Secondary | ICD-10-CM | POA: Diagnosis not present

## 2022-10-24 DIAGNOSIS — I10 Essential (primary) hypertension: Secondary | ICD-10-CM | POA: Diagnosis not present

## 2022-10-24 DIAGNOSIS — K5730 Diverticulosis of large intestine without perforation or abscess without bleeding: Secondary | ICD-10-CM | POA: Diagnosis not present

## 2022-10-24 DIAGNOSIS — I480 Paroxysmal atrial fibrillation: Secondary | ICD-10-CM | POA: Diagnosis not present

## 2022-10-31 ENCOUNTER — Telehealth: Payer: Self-pay | Admitting: Podiatry

## 2022-10-31 NOTE — Telephone Encounter (Signed)
Patient is scheduled to return to work 11/07/2022, she said that she is unable to return to work because she can't wear the steel toe shoes. I need to know if you are extending her leave  and it so until when?

## 2022-11-01 ENCOUNTER — Encounter: Payer: Self-pay | Admitting: Podiatry

## 2022-11-01 DIAGNOSIS — L648 Other androgenic alopecia: Secondary | ICD-10-CM | POA: Diagnosis not present

## 2022-11-01 DIAGNOSIS — L668 Other cicatricial alopecia: Secondary | ICD-10-CM | POA: Diagnosis not present

## 2022-11-02 DIAGNOSIS — Z1231 Encounter for screening mammogram for malignant neoplasm of breast: Secondary | ICD-10-CM | POA: Diagnosis not present

## 2022-11-08 DIAGNOSIS — Z79891 Long term (current) use of opiate analgesic: Secondary | ICD-10-CM | POA: Diagnosis not present

## 2022-11-08 DIAGNOSIS — M170 Bilateral primary osteoarthritis of knee: Secondary | ICD-10-CM | POA: Diagnosis not present

## 2022-11-08 DIAGNOSIS — G894 Chronic pain syndrome: Secondary | ICD-10-CM | POA: Diagnosis not present

## 2022-11-29 ENCOUNTER — Ambulatory Visit: Payer: BC Managed Care – PPO

## 2022-12-06 ENCOUNTER — Ambulatory Visit (HOSPITAL_BASED_OUTPATIENT_CLINIC_OR_DEPARTMENT_OTHER)
Admission: RE | Admit: 2022-12-06 | Discharge: 2022-12-06 | Disposition: A | Discharge status: Home / Self Care | Payer: BC Managed Care – PPO | Source: Ambulatory Visit | Attending: Family Medicine | Admitting: Family Medicine

## 2022-12-06 ENCOUNTER — Other Ambulatory Visit (HOSPITAL_BASED_OUTPATIENT_CLINIC_OR_DEPARTMENT_OTHER): Payer: Self-pay | Admitting: Family Medicine

## 2022-12-06 DIAGNOSIS — Z79891 Long term (current) use of opiate analgesic: Secondary | ICD-10-CM | POA: Diagnosis not present

## 2022-12-06 DIAGNOSIS — E1169 Type 2 diabetes mellitus with other specified complication: Secondary | ICD-10-CM | POA: Diagnosis not present

## 2022-12-06 DIAGNOSIS — M7989 Other specified soft tissue disorders: Secondary | ICD-10-CM | POA: Diagnosis not present

## 2022-12-06 DIAGNOSIS — R079 Chest pain, unspecified: Secondary | ICD-10-CM | POA: Diagnosis not present

## 2022-12-06 DIAGNOSIS — G894 Chronic pain syndrome: Secondary | ICD-10-CM | POA: Diagnosis not present

## 2022-12-06 DIAGNOSIS — M79606 Pain in leg, unspecified: Secondary | ICD-10-CM | POA: Diagnosis not present

## 2022-12-06 DIAGNOSIS — R600 Localized edema: Secondary | ICD-10-CM | POA: Insufficient documentation

## 2022-12-06 DIAGNOSIS — M79662 Pain in left lower leg: Secondary | ICD-10-CM | POA: Diagnosis not present

## 2022-12-06 DIAGNOSIS — M79661 Pain in right lower leg: Secondary | ICD-10-CM | POA: Diagnosis not present

## 2022-12-06 DIAGNOSIS — M170 Bilateral primary osteoarthritis of knee: Secondary | ICD-10-CM | POA: Diagnosis not present

## 2022-12-13 ENCOUNTER — Telehealth: Payer: Self-pay | Admitting: Podiatry

## 2022-12-13 NOTE — Telephone Encounter (Signed)
Whitney Daniel is scheduled to return  to work on 4/16, she said that she's not ready to return to work on that date. Her disability company is asking why she's not able to return to work. I sent her last office visit note for 10/12/2022 but they still need clairfication as to why she needs to extend her leave. Please advise if pt. Needs to schedule a follow up appt? Also, how long you would like to extend leave?

## 2022-12-14 ENCOUNTER — Ambulatory Visit: Payer: Medicare Other | Admitting: Podiatry

## 2022-12-19 ENCOUNTER — Ambulatory Visit (INDEPENDENT_AMBULATORY_CARE_PROVIDER_SITE_OTHER): Payer: BC Managed Care – PPO | Admitting: Podiatry

## 2022-12-19 DIAGNOSIS — M19079 Primary osteoarthritis, unspecified ankle and foot: Secondary | ICD-10-CM | POA: Diagnosis not present

## 2022-12-19 NOTE — Progress Notes (Signed)
Subjective:  Patient ID: Whitney Daniel, female    DOB: 05-Sep-1955,  MRN: [id number]  Chief Complaint  Patient presents with   Foot Pain    Rm 13 Right foot pain. Pt states she still has the pain ansd edema is present mostly at the end of the day.     67 y.o. female presents with the above complaint.  Patient presents for follow-up of right talonavicular joint pain.  She states therapy helped some but she is done with physical therapy she would like to discuss next treatment plan   Review of Systems: Negative except as noted in the HPI. Denies N/V/F/Ch.  Past Medical History:  Diagnosis Date   A-fib ([facility])    Anemia    Cancer ([facility])    Fibromyalgia    Hypertension    Hypothyroidism    Osteoarthritis    SVD (spontaneous vaginal delivery)    x 5    Current Outpatient Medications:    albuterol (VENTOLIN HFA) 108 (90 Base) MCG/ACT inhaler, Inhale 2 puffs into the lungs every 4 (four) hours as needed for wheezing or shortness of breath., Disp: , Rfl:    amLODipine (NORVASC) 5 MG tablet, TAKE 1 TABLET BY MOUTH DAILY, Disp: 90 tablet, Rfl: 1   apixaban (ELIQUIS) 5 MG TABS tablet, TAKE 1 TABLET BY MOUTH TWICE A DAY, Disp: 180 tablet, Rfl: 1   APPLE CIDER VINEGAR PO, Take 1 tablet by mouth daily. Gummies, Disp: , Rfl:    Cholecalciferol (VITAMIN D3 PO), Take 1 tablet by mouth daily. Vit k2, Disp: , Rfl:    diclofenac Sodium (VOLTAREN) 1 % GEL, Apply 2 g topically daily as needed (pain)., Disp: , Rfl:    Evolocumab (REPATHA SURECLICK) 140 MG/ML SOAJ, INJECT 140 MG INTO THE SKIN EVERY 14 (FOURTEEN) DAYS., Disp: 6 mL, Rfl: 3   GAVILYTE-G 236 g solution, Take 4,000 mLs by mouth as directed., Disp: , Rfl:    hydrOXYzine (ATARAX/VISTARIL) 25 MG tablet, Take 25 mg by mouth daily as needed for itching., Disp: , Rfl:    isosorbide mononitrate (IMDUR) 30 MG 24 hr tablet, Take 0.5 tablets (15 mg total) by mouth daily., Disp: 45 tablet, Rfl: 2   levothyroxine (SYNTHROID, LEVOTHROID) 75 MCG tablet,  Take 75 mcg by mouth daily before breakfast. , Disp: , Rfl:    LINZESS 145 MCG CAPS capsule, Take 145 mcg by mouth daily as needed (Constipation)., Disp: , Rfl:    losartan-hydrochlorothiazide (HYZAAR) 50-12.5 MG tablet, Take 1 tablet by mouth daily., Disp: , Rfl:    Multiple Vitamin (MULTIVITAMIN WITH MINERALS) TABS, Take 1 tablet by mouth daily., Disp: , Rfl:    nitroGLYCERIN (NITROSTAT) 0.4 MG SL tablet, TAKE 1 TABLET BY MOUTH EVERY 5 MINUTES AS NEEDED FOR CHEST PAIN (Patient taking differently: 0.4 mg every 5 (five) minutes as needed for chest pain. TAKE 1 TABLET BY MOUTH EVERY 5 MINUTES AS NEEDED FOR CHEST PAIN), Disp: 25 tablet, Rfl: 5   pregabalin (LYRICA) 100 MG capsule, Take 100 mg by mouth daily., Disp: , Rfl:    pregabalin (LYRICA) 75 MG capsule, Take 75 mg by mouth 3 (three) times daily as needed (Nerve pain)., Disp: , Rfl:    traMADol (ULTRAM) 50 MG tablet, Take 1 tablet (50 mg total) by mouth 2 (two) times daily as needed for moderate pain., Disp: 60 tablet, Rfl: 5  Social History   Tobacco Use  Smoking Status Never  Smokeless Tobacco Never    Allergies  Allergen Reactions  Acetaminophen Other (See Comments)    Only can take 1000 mg daily   Codeine Other (See Comments)    Patient states "she did not like the way codeine made her feel"     Hydrocodone Bitartrate Er Other (See Comments)    Too strong   Oxycodone Hcl Other (See Comments)    Too strong   Vicodin [Hydrocodone-Acetaminophen] Nausea And Vomiting    Doesn't like how it makes her feel but can take if needs to for pain   Objective:   There were no vitals filed for this visit.  There is no height or weight on file to calculate BMI. Constitutional Well developed. Well nourished.  Vascular Dorsalis pedis pulses palpable bilaterally. Posterior tibial pulses palpable bilaterally. Capillary refill normal to all digits.  No cyanosis or clubbing noted. Pedal hair growth normal.  Neurologic Normal  speech. Oriented to person, place, and time. Epicritic sensation to light touch grossly present bilaterally.  Dermatologic Nails well groomed and normal in appearance. No open wounds. No skin lesions.  Orthopedic: Negative anterior drawer test or talar tilt test noted.  Limited range of motion noted at the ankle joint no deep intra-articular ankle pain noted.  Pain with dorsiflexion of the ankle joint likely due to the impingement of the anterior ankle.  Pain at the talonavicular joint.  Clinically able to appreciate osteophytes at the TN joint   Radiographs: IMPRESSION: 1. Nonvisualization of the anterior talofibular ligament with edema about the expected location suggesting tear, likely a chronic process. 2. Edema about the spring ligament suggesting chronic ligamentous sprain. 3. Osteochondral injury about the medial aspect of the talar dome. 4. Moderate osteoarthritis of the talonavicular joint with subchondral cystic changes and marrow edema. 5. Mild subchondral edema of the cuboid suggesting mild arthritis. 6. Subcutaneous soft tissue edema about the ankle and dorsum of the foot without fluid collection or hematoma.   Assessment:   No diagnosis found.   Plan:  Patient was evaluated and treated and all questions answered.  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  Continue using cam boot but would like for transition to Tri-Lock ankle brace.  Given the amount of the pain that she is still having she will benefit from an MRI. -MRI was reviewed with the patient in extensive detail.  At this time clinically correlating her pain most of her pain appears to be at the talonavicular joint with moderate osteoarthritis present.  The osteochondral injury is not bothering her at the ankle joint as her pain is more at the anterior dorsal foot near the ankle. -Patient would not benefit from  PRP injection.  I discussed the risk and complication and benefits of PRP injection she states understanding would like to proceed with PRP injection -She is scheduled for PRP next week -She would like some time to think over from surgical fusion -Will extend her disability to another 8 weeks    Return in about 2 weeks (around 01/02/2023) for Nurse Schedule PRP (around 11am).

## 2023-01-03 ENCOUNTER — Ambulatory Visit (INDEPENDENT_AMBULATORY_CARE_PROVIDER_SITE_OTHER): Payer: BC Managed Care – PPO | Admitting: Podiatry

## 2023-01-03 DIAGNOSIS — M545 Low back pain, unspecified: Secondary | ICD-10-CM | POA: Diagnosis not present

## 2023-01-03 DIAGNOSIS — M19079 Primary osteoarthritis, unspecified ankle and foot: Secondary | ICD-10-CM

## 2023-01-03 DIAGNOSIS — M47816 Spondylosis without myelopathy or radiculopathy, lumbar region: Secondary | ICD-10-CM | POA: Diagnosis not present

## 2023-01-03 DIAGNOSIS — Z79891 Long term (current) use of opiate analgesic: Secondary | ICD-10-CM | POA: Diagnosis not present

## 2023-01-03 DIAGNOSIS — M170 Bilateral primary osteoarthritis of knee: Secondary | ICD-10-CM | POA: Diagnosis not present

## 2023-01-03 DIAGNOSIS — G894 Chronic pain syndrome: Secondary | ICD-10-CM | POA: Diagnosis not present

## 2023-01-03 DIAGNOSIS — M779 Enthesopathy, unspecified: Secondary | ICD-10-CM

## 2023-01-03 NOTE — Progress Notes (Signed)
Right talonavicular joint PRP injection given 5 cc.  No complication noted.  Patient was able to tolerate.

## 2023-01-12 IMAGING — MR MR HEAD W/O CM
6 of 11 series · 26 of 48 positions shown · non-contrast
Comparison: 01/26/2017

CLINICAL DATA: Neuro deficit, acute, stroke suspected

EXAM:
MRI HEAD WITHOUT CONTRAST
TECHNIQUE: Multiplanar, multiecho pulse sequences of the brain and surrounding
structures were obtained without intravenous contrast.

[Series 2: DWI · axial · 3.0mm · 0.94mm/px · z∈[-120,+34]mm · 8 of 108 slices shown (1 of 2)]
[im 1/108]
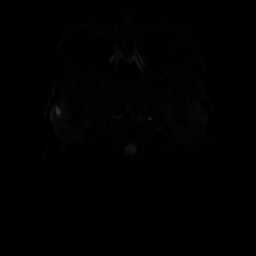
[im 16/108]
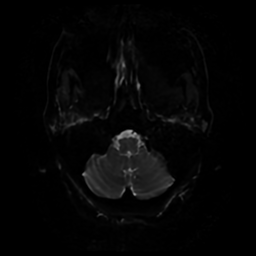
[im 31/108]
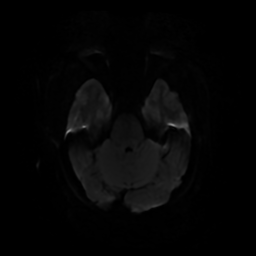
[im 46/108]
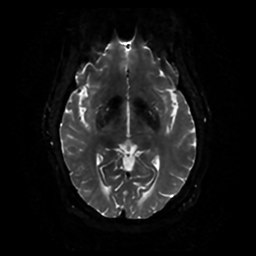
[im 62/108]
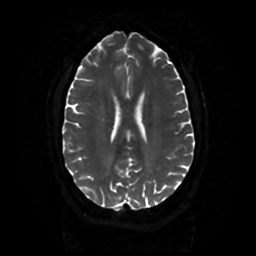
[im 77/108]
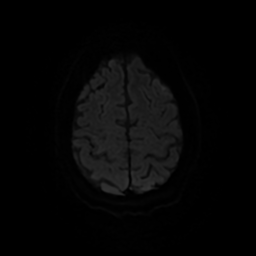
[im 92/108]
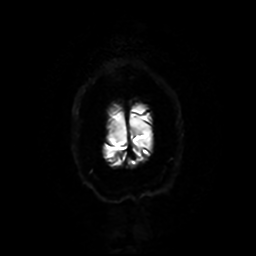
[im 108/108]
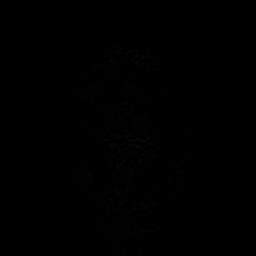

[Series 3: DWI · coronal · 4.0mm · 0.94mm/px · 6 of 80 slices shown (2 of 2)]
[im 1/80]
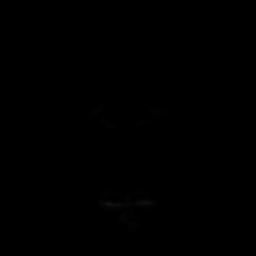
[im 16/80]
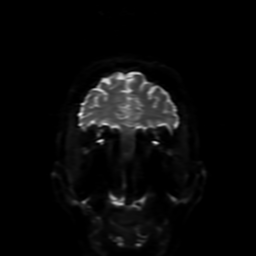
[im 32/80]
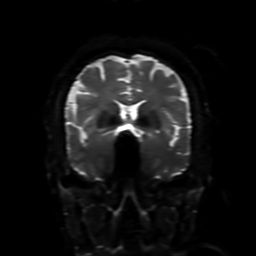
[im 48/80]
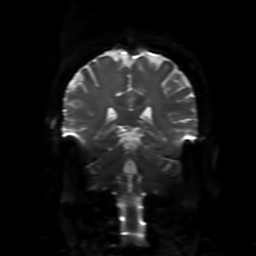
[im 64/80]
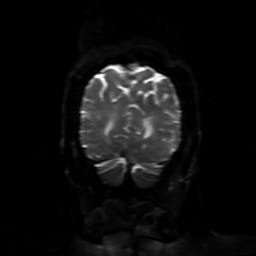
[im 80/80]
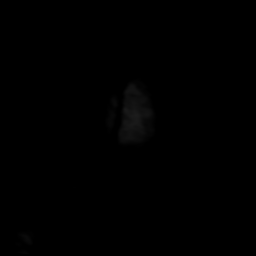

[Series 4: FLAIR · sagittal · 5.0mm · 0.23mm/px · 2 of 27 slices shown (1 of 2)]
[im 1/27]
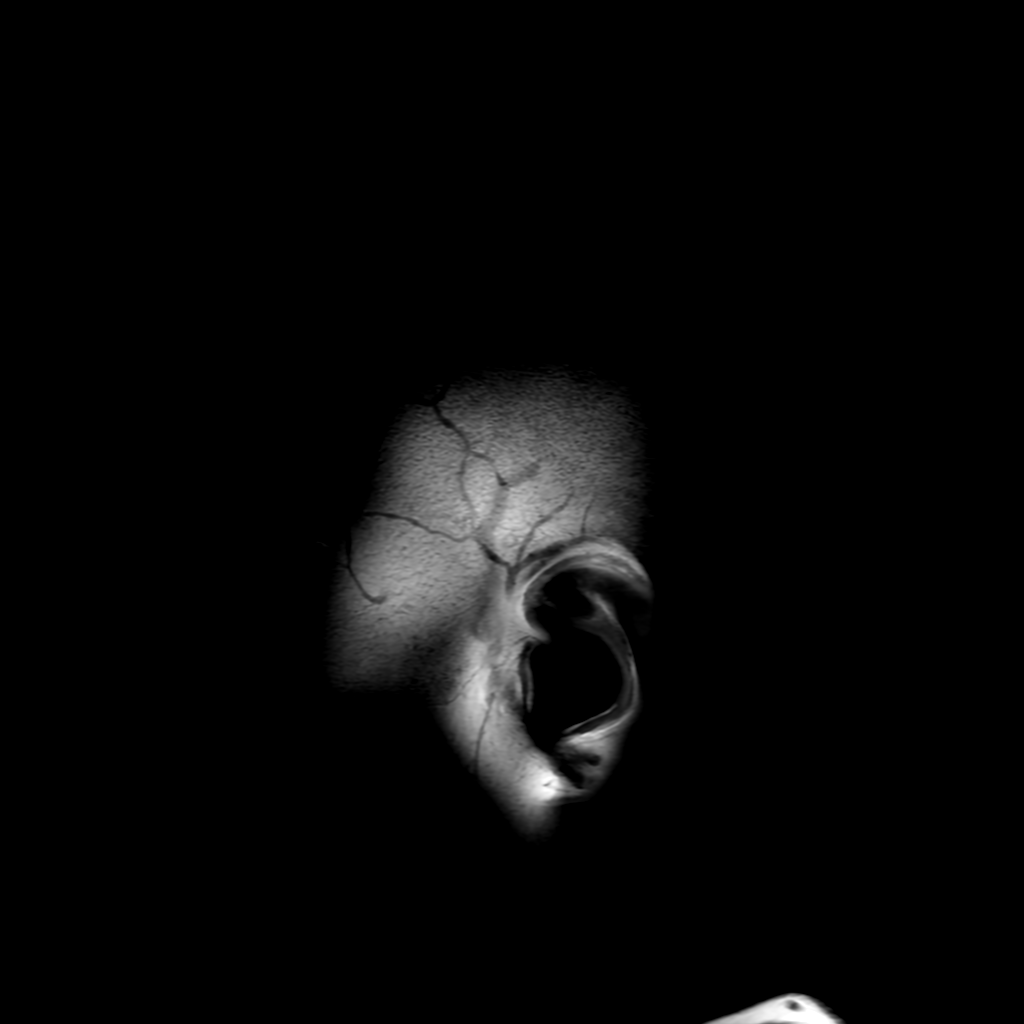
[im 27/27]
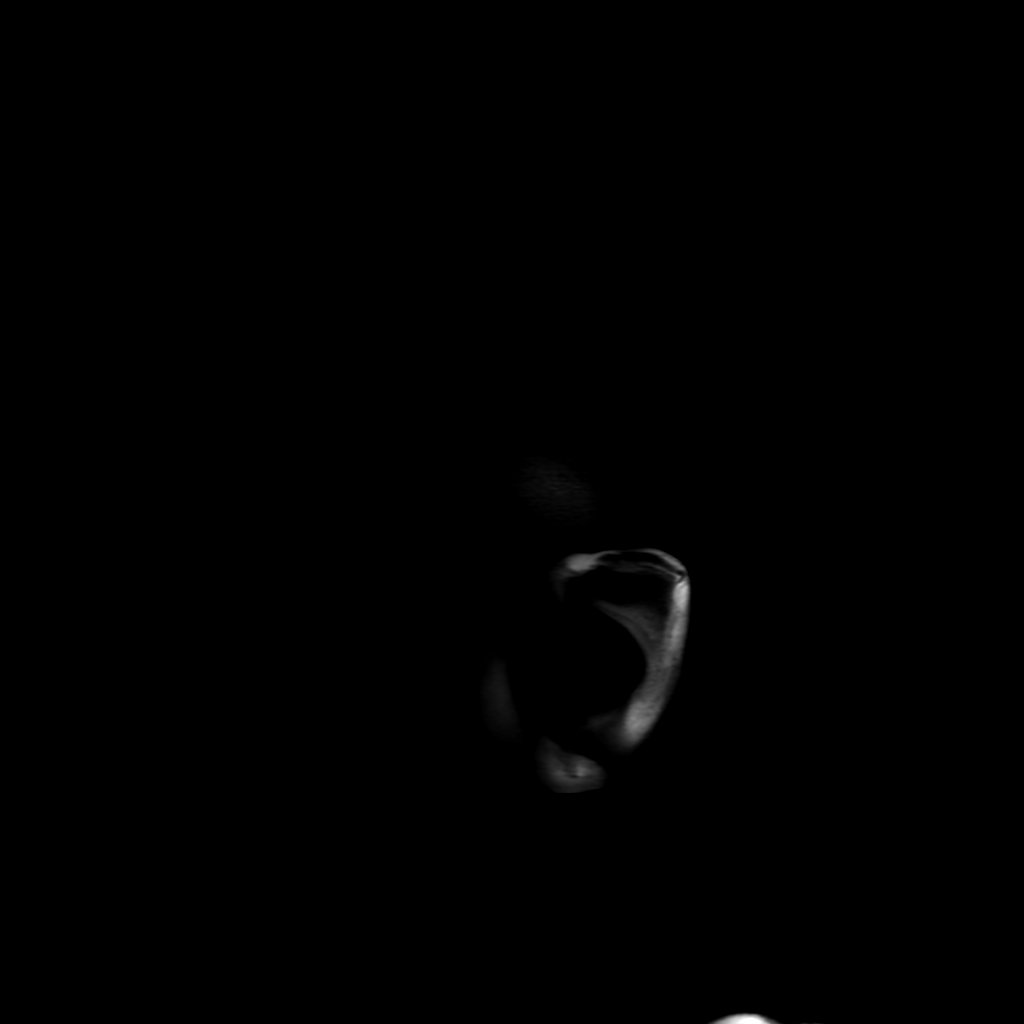

[Series 6: FLAIR · axial · 4.0mm · 0.45mm/px · z∈[-109,+36]mm · 3 of 35 slices shown (2 of 2)]
[im 1/35]
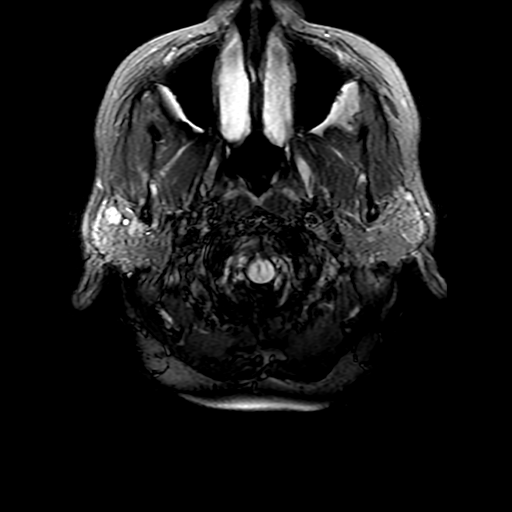
[im 18/35]
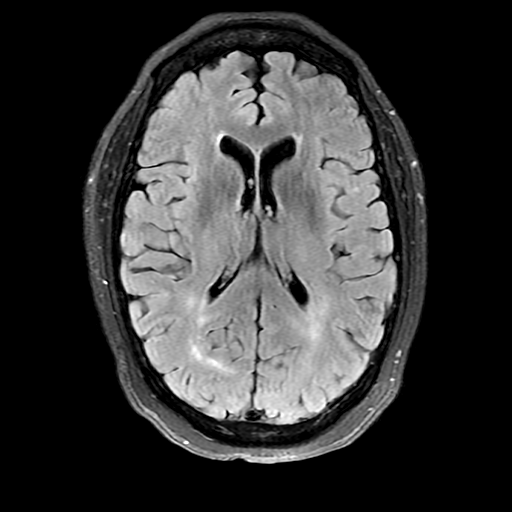
[im 35/35]
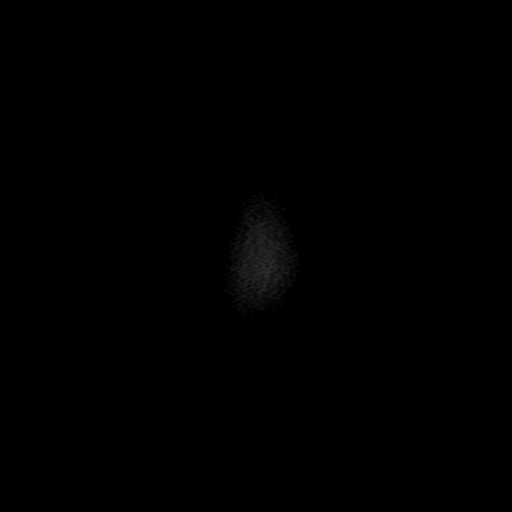

[Series 250: ADC · axial · 3.0mm · 0.94mm/px · z∈[-120,+34]mm · 4 of 54 slices shown (1 of 2)]
[im 1/54]
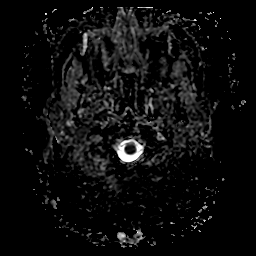
[im 18/54]
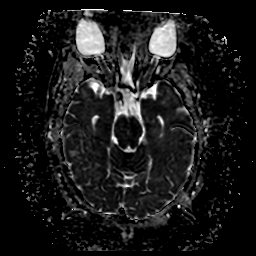
[im 36/54]
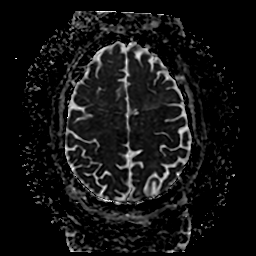
[im 54/54]
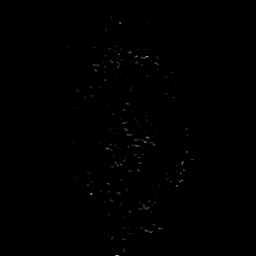

[Series 350: ADC · coronal · 4.0mm · 0.94mm/px · 3 of 39 slices shown (2 of 2)]
[im 1/39]
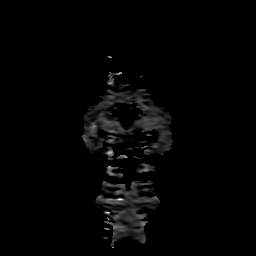
[im 20/39]
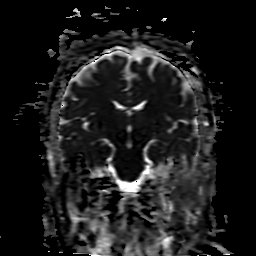
[im 39/39]
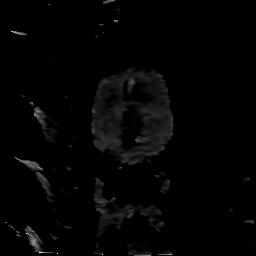

[26 of 48 positions shown; findings below may reference images not displayed]

FINDINGS: Brain: No acute infarct, mass effect or extra-axial collection. No
acute or chronic hemorrhage. There is multifocal hyperintense
T2-weighted signal within the white matter. Parenchymal volume and
CSF spaces are normal. The midline structures are normal.

Vascular: Major flow voids are preserved.

Skull and upper cervical spine: Normal calvarium and skull base.
Visualized upper cervical spine and soft tissues are normal.

Sinuses/Orbits:No paranasal sinus fluid levels or advanced mucosal
thickening. No mastoid or middle ear effusion. Normal orbits.
IMPRESSION: 1. No acute intracranial abnormality.
2. Findings of chronic microvascular ischemia.

## 2023-01-30 DIAGNOSIS — M47816 Spondylosis without myelopathy or radiculopathy, lumbar region: Secondary | ICD-10-CM | POA: Diagnosis not present

## 2023-01-30 DIAGNOSIS — Z79891 Long term (current) use of opiate analgesic: Secondary | ICD-10-CM | POA: Diagnosis not present

## 2023-01-30 DIAGNOSIS — G894 Chronic pain syndrome: Secondary | ICD-10-CM | POA: Diagnosis not present

## 2023-02-21 ENCOUNTER — Ambulatory Visit: Payer: BC Managed Care – PPO | Admitting: Orthopaedic Surgery

## 2023-02-26 ENCOUNTER — Encounter: Payer: Self-pay | Admitting: Physician Assistant

## 2023-02-26 DIAGNOSIS — I2510 Atherosclerotic heart disease of native coronary artery without angina pectoris: Secondary | ICD-10-CM

## 2023-02-26 HISTORY — DX: Atherosclerotic heart disease of native coronary artery without angina pectoris: I25.10

## 2023-02-26 NOTE — Progress Notes (Unsigned)
Cardiology Office Note:    Date:  02/28/2023  ID:  Whitney Daniel, DOB 04/07/1956, MRN [id number] PCP: Farris Has, MD  [facility] Providers Cardiologist:  Lesleigh Noe, MD (Inactive)       Patient Profile:      Coronary artery disease  Moderate Nonobstructive; LHC 04/16/18: oRCA 40-50, apical LAD 70, patent LCx, EF > 50 >> Med Rx CCTA 02/07/21: CAC score 4.96, 69th percentile; dist to apical LAD 50-69 (small 1.3 cm in this location), pD1 0-24, LCx patent, oRCA 0-24 Paroxysmal atrial fibrillation  TTE 11/03/2018: EF 55, NL wall motion, mild BAE, interatrial septum aneurysmal, mild TR Event monitor 11/28/20: NSR, Avg HR 65, no AFib, rare PVCs, 4 beat NSVT Hypertension  Hyperlipidemia  Hypothyroidism   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 female who returns for follow up of AFib, CAD. She is a prior patient of Dr. Katrinka Blazing and was last seen 06/08/22.  She is here alone.  Over the past couple weeks, she has noted left arm discomfort that radiates up into her left chest and neck.  This can occur at rest.  She can notice it with activity.  She can also do activities without symptoms.  Symptoms going on for several minutes.  She has taken nitroglycerin in the past with relief.  She has not had any pleuritic chest pain, chest pain with palpation, chest pain with positional changes or chest pain with lying supine.  She does feel some weakness in her arm.  She also notes weakness in her legs after getting up in the mornings were sitting for a while.  She has not had orthopnea, syncope, dyspnea with exertion.  She does note lower extremity edema.  This seems to mostly improve with elevation.  She does not smoke.  Review of Systems  Constitutional: Negative for chills and fever.  Respiratory:  Negative for cough.   Gastrointestinal:  Negative for diarrhea, hematochezia, melena and vomiting.   Genitourinary:  Negative for hematuria.   see HPI    Studies Reviewed:   EKG Interpretation  Date/Time:  Wednesday February 28 2023 10:37:51 EDT Ventricular Rate:  67 PR Interval:  162 QRS Duration: 96 QT Interval:  426 QTC Calculation: 450 R Axis:   -76 Text Interpretation: Normal sinus rhythm Left axis deviation Inferior Q waves noted Poor R wave progression No acute changes No change when compared to prior tracing from 05/01/2021 Confirmed by Tereso Newcomer [phone]) on 02/28/2023 10:40:07 AM   Risk Assessment/Calculations:    CHA2DS2-VASc Score = 4   This indicates a 4.8% annual risk of stroke. The patient's score is based upon: CHF History: 0 HTN History: 1 Diabetes History: 0 Stroke History: 0 Vascular Disease History: 1 Age Score: 1 Gender Score: 1            Physical Exam:   VS:  BP 110/62   Pulse 82   Ht 5\' 8"  (1.727 m)   Wt 212 lb 12.8 oz (96.5 kg)   SpO2 98%   BMI 32.36 kg/m    Wt Readings from Last 3 Encounters:  02/28/23 212 lb 12.8 oz (96.5 kg)  10/13/22 206 lb (93.4 kg)  06/08/22 191 lb 9.6 oz (86.9 kg)    Constitutional:      Appearance: Healthy appearance. Not in distress.  Neck:     Vascular: No carotid bruit or JVR.  JVD normal.  Pulmonary:     Breath sounds: Normal breath sounds. No wheezing. No rales.  Cardiovascular:     Normal rate. Regular rhythm.     Murmurs: There is no murmur.  Edema:    Peripheral edema absent.  Abdominal:     Palpations: Abdomen is soft.       ASSESSMENT AND PLAN:   Precordial chest pain She has had symptoms of L arm and L chest pain over the past 2 weeks. Her symptoms are possibly cardiac. She does note weakness in her arm and leg weakness. She also has neck pain. Question if she may have cervical spine impingement. If cardiac workup neg, would recommend follow up with PCP to further investigate for this.  Lexiscan Myoview  Follow up 6-8 weeks.   CAD (coronary artery disease) Mod nonobstructive coronary artery  disease by cath in 2019 and CCTA in 2022. She has mod apical LAD stenosis.  She was previously taken off of isosorbide.  As noted, she has left arm and left chest pain that is possibly cardiac.  She has had relief with nitroglycerin at times.  EKG today does not demonstrate any acute changes.   I will resume her isosorbide at 15 mg daily.   Proceed with Lexiscan Myoview as noted.   She does not require antiplatelet therapy as she is on Eliquis.  Continue Repatha 140 mg every 14 days, nitroglycerin as needed.  Follow-up 6 to 8 weeks.   I will have her follow-up with Dr. Lynnette Caffey and me given Dr. Michaelle Copas retirement.  Bilateral leg edema She has evidence of varicose veins on exam.  I suspect she has mainly venous insufficiency.  Her edema improves with elevation.  I have recommended compression, elevation.  I will also give her a prescription for Lasix to take as needed.  I have cautioned her to only use this if she has a particularly bad day in regards to swelling.  I have asked her to call us if she takes more than 2 doses per week so that we can obtain a follow-up BMET. Lasix 20 mg daily as needed  PAF (paroxysmal atrial fibrillation) (HCC) Sinus rhythm.  She is tolerating anticoagulation.  Labs from primary care reviewed.  In April 2024, her hemoglobin was normal at 12.2, creatinine normal at 0.81.  Continue Eliquis 5 mg twice daily.  Hypertension Blood pressure is controlled.  Continue amlodipine 5 mg daily, losartan/HCTZ 50/12.5 mg daily.  Hyperlipidemia Continue Repatha 140 mg every 14 days.  Labs from primary care reviewed.  ALT in April 2024 was normal at 12.  Obtain fasting lipids today.    Informed Consent   Shared Decision Making/Informed Consent The risks [chest pain, shortness of breath, cardiac arrhythmias, dizziness, blood pressure fluctuations, myocardial infarction, stroke/transient ischemic attack, nausea, vomiting, allergic reaction, radiation exposure, metallic taste sensation  and life-threatening complications (estimated to be 1 in 10,000)], benefits (risk stratification, diagnosing coronary artery disease, treatment guidance) and alternatives of a nuclear stress test were discussed in detail with Ms. Guzy and she agrees to proceed.    Dispo:  Return in about 8 weeks (around 04/25/2023) for Routine Follow Up, w/ Dr. Lynnette Caffey, or Tereso Newcomer, PA-C.  Signed, Tereso Newcomer, PA-C

## 2023-02-28 ENCOUNTER — Encounter: Payer: Self-pay | Admitting: *Deleted

## 2023-02-28 ENCOUNTER — Telehealth (HOSPITAL_COMMUNITY): Payer: Self-pay | Admitting: *Deleted

## 2023-02-28 ENCOUNTER — Encounter: Payer: Self-pay | Admitting: Physician Assistant

## 2023-02-28 ENCOUNTER — Ambulatory Visit: Payer: BC Managed Care – PPO | Attending: Physician Assistant | Admitting: Physician Assistant

## 2023-02-28 VITALS — BP 110/62 | HR 82 | Ht 68.0 in | Wt 212.8 lb

## 2023-02-28 DIAGNOSIS — E782 Mixed hyperlipidemia: Secondary | ICD-10-CM | POA: Diagnosis not present

## 2023-02-28 DIAGNOSIS — I2510 Atherosclerotic heart disease of native coronary artery without angina pectoris: Secondary | ICD-10-CM

## 2023-02-28 DIAGNOSIS — R600 Localized edema: Secondary | ICD-10-CM | POA: Diagnosis not present

## 2023-02-28 DIAGNOSIS — I480 Paroxysmal atrial fibrillation: Secondary | ICD-10-CM | POA: Diagnosis not present

## 2023-02-28 DIAGNOSIS — R072 Precordial pain: Secondary | ICD-10-CM | POA: Insufficient documentation

## 2023-02-28 DIAGNOSIS — I10 Essential (primary) hypertension: Secondary | ICD-10-CM

## 2023-02-28 MED ORDER — FUROSEMIDE 20 MG PO TABS: ORAL_TABLET | ORAL | 1 refills | Status: DC

## 2023-02-28 MED ORDER — ISOSORBIDE MONONITRATE ER 30 MG PO TB24: 15.0000 mg | ORAL_TABLET | Freq: Every day | ORAL | 3 refills | Status: DC

## 2023-02-28 NOTE — Assessment & Plan Note (Signed)
She has evidence of varicose veins on exam.  I suspect she has mainly venous insufficiency.  Her edema improves with elevation.  I have recommended compression, elevation.  I will also give her a prescription for Lasix to take as needed.  I have cautioned her to only use this if she has a particularly bad day in regards to swelling.  I have asked her to call us if she takes more than 2 doses per week so that we can obtain a follow-up BMET. Lasix 20 mg daily as needed

## 2023-02-28 NOTE — Telephone Encounter (Signed)
Patient given detailed instructions per Myocardial Perfusion Study Information Sheet for the test on 03/05/2023 at 7:45. Patient notified to arrive 15 minutes early and that it is imperative to arrive on time for appointment to keep from having the test rescheduled.  If you need to cancel or reschedule your appointment, please call the office within 24 hours of your appointment. . Patient verbalized understanding.Whitney Daniel

## 2023-02-28 NOTE — Patient Instructions (Addendum)
Medication Instructions:  Your physician has recommended you make the following change in your medication:   START Isosorbide 30 mg taking only 1/2 tablet daily  START Lasix 20 mg taking 1 daily only as needed for edema.  If you have to take more then 2 doses a week, please call us and let us know  *If you need a refill on your cardiac medications before your next appointment, please call your pharmacy*   Lab Work: TODAY:  LIPID  If you have labs (blood work) drawn today and your tests are completely normal, you will receive your results only by: MyChart Message (if you have MyChart) OR A paper copy in the mail If you have any lab test that is abnormal or we need to change your treatment, we will call you to review the results.   Testing/Procedures: Your physician has requested that you have a lexiscan myoview. For further information please visit [email or web address] Please follow instruction sheet, BELOW:    You are scheduled for a Myocardial Perfusion Imaging Study Please arrive 15 minutes prior to your appointment time for registration and insurance purposes.  The test will take approximately 3 to 4 hours to complete; you may bring reading material.  If someone comes with you to your appointment, they will need to remain in the main lobby due to limited space in the testing area. **If you are pregnant or breastfeeding, please notify the nuclear lab prior to your appointment**  How to prepare for your Myocardial Perfusion Test: Do not eat or drink 3 hours prior to your test, except you may have water. Do not consume products containing caffeine (regular or decaffeinated) 12 hours prior to your test. (ex: coffee, chocolate, sodas, tea). Do bring a list of your current medications with you.  If not listed below, you may take your medications as normal. Do wear comfortable clothes (no dresses or overalls) and walking shoes, tennis shoes preferred (No heels or open toe shoes are  allowed). Do NOT wear cologne, perfume, aftershave, or lotions (deodorant is allowed). If these instructions are not followed, your test will have to be rescheduled.     Follow-Up: At [facility], you and your health needs are our priority.  As part of our continuing mission to provide you with exceptional heart care, we have created designated Provider Care Teams.  These Care Teams include your primary Cardiologist (physician) and Advanced Practice Providers (APPs -  Physician Assistants and Nurse Practitioners) who all work together to provide you with the care you need, when you need it.  We recommend signing up for the patient portal called "MyChart".  Sign up information is provided on this After Visit Summary.  MyChart is used to connect with patients for Virtual Visits (Telemedicine).  Patients are able to view lab/test results, encounter notes, upcoming appointments, etc.  Non-urgent messages can be sent to your provider as well.   To learn more about what you can do with MyChart, go to [email or web address].    Your next appointment:   6-8 week(s)  Provider:   Tereso Newcomer, PA-C         Other Instructions Get some over the counter compression stockings and elevate your leg as much as you can

## 2023-02-28 NOTE — Assessment & Plan Note (Signed)
She has had symptoms of L arm and L chest pain over the past 2 weeks. Her symptoms are possibly cardiac. She does note weakness in her arm and leg weakness. She also has neck pain. Question if she may have cervical spine impingement. If cardiac workup neg, would recommend follow up with PCP to further investigate for this.  Lexiscan Myoview  Follow up 6-8 weeks.

## 2023-02-28 NOTE — Assessment & Plan Note (Signed)
Blood pressure is controlled.  Continue amlodipine 5 mg daily, losartan/HCTZ 50/12.5 mg daily.

## 2023-02-28 NOTE — Assessment & Plan Note (Addendum)
Mod nonobstructive coronary artery disease by cath in 2019 and CCTA in 2022. She has mod apical LAD stenosis.  She was previously taken off of isosorbide.  As noted, she has left arm and left chest pain that is possibly cardiac.  She has had relief with nitroglycerin at times.  EKG today does not demonstrate any acute changes.   I will resume her isosorbide at 15 mg daily.   Proceed with Lexiscan Myoview as noted.   She does not require antiplatelet therapy as she is on Eliquis.  Continue Repatha 140 mg every 14 days, nitroglycerin as needed.  Follow-up 6 to 8 weeks.   I will have her follow-up with Dr. Lynnette Caffey and me given Dr. Michaelle Copas retirement.

## 2023-02-28 NOTE — Assessment & Plan Note (Signed)
Sinus rhythm.  She is tolerating anticoagulation.  Labs from primary care reviewed.  In April 2024, her hemoglobin was normal at 12.2, creatinine normal at 0.81.  Continue Eliquis 5 mg twice daily.

## 2023-02-28 NOTE — Assessment & Plan Note (Signed)
Continue Repatha 140 mg every 14 days.  Labs from primary care reviewed.  ALT in April 2024 was normal at 12.  Obtain fasting lipids today.

## 2023-03-01 ENCOUNTER — Ambulatory Visit: Payer: BC Managed Care – PPO | Attending: Physician Assistant

## 2023-03-01 ENCOUNTER — Ambulatory Visit (INDEPENDENT_AMBULATORY_CARE_PROVIDER_SITE_OTHER): Payer: BC Managed Care – PPO | Admitting: Podiatry

## 2023-03-01 DIAGNOSIS — M19079 Primary osteoarthritis, unspecified ankle and foot: Secondary | ICD-10-CM

## 2023-03-01 NOTE — Progress Notes (Signed)
Subjective:  Patient ID: Whitney Daniel, female    DOB: Jul 15, 1956,  MRN: [id number]  Chief Complaint  Patient presents with   Osteoarthritis of talonavicular joint    PRP follow up  Pt stated that she is still having some pain she does not feel like the PRP helped     67 y.o. female presents with the above complaint.  Patient presents for follow-up of right talonavicular joint pain.  She states PRP helped some but did not get rid of her pain is much as she thought.  She will think about doing another PRP injection.  Denies any other acute complaints   Review of Systems: Negative except as noted in the HPI. Denies N/V/F/Ch.  Past Medical History:  Diagnosis Date   A-fib (HCC)    Anemia    CAD (coronary artery disease) 02/26/2023   Moderate Nonobstructive; LHC 04/16/18: oRCA 40-50, apical LAD 70, patent LCx, EF > 50 >> Med Rx // CCTA 02/07/21: CAC score 4.96, 69th percentile; dist to apical LAD 50-69 (small 1.3 cm in this location), pD1 0-24, LCx patent, oRCA 0-24  //  Lexiscan Myoview 03/05/2023: EF 65, normal perfusion, low risk   Cancer (HCC)    Fibromyalgia    Hypertension    Hypothyroidism    Osteoarthritis    SVD (spontaneous vaginal delivery)    x 5    Current Outpatient Medications:    albuterol (VENTOLIN HFA) 108 (90 Base) MCG/ACT inhaler, Inhale 2 puffs into the lungs every 4 (four) hours as needed for wheezing or shortness of breath., Disp: , Rfl:    amLODipine (NORVASC) 5 MG tablet, TAKE 1 TABLET BY MOUTH DAILY, Disp: 90 tablet, Rfl: 1   apixaban (ELIQUIS) 5 MG TABS tablet, TAKE 1 TABLET BY MOUTH TWICE A DAY, Disp: 180 tablet, Rfl: 1   APPLE CIDER VINEGAR PO, Take 1 tablet by mouth daily. Gummies, Disp: , Rfl:    Cholecalciferol (VITAMIN D3 PO), Take 1 tablet by mouth daily. Vit k2, Disp: , Rfl:    diclofenac Sodium (VOLTAREN) 1 % GEL, Apply 2 g topically daily as needed (pain)., Disp: , Rfl:    Evolocumab (REPATHA SURECLICK) 140 MG/ML SOAJ, INJECT 140 MG INTO THE SKIN  EVERY 14 (FOURTEEN) DAYS., Disp: 6 mL, Rfl: 3   furosemide (LASIX) 20 MG tablet, TAKE 1 TABLET BY MOUTH DAILY ONLY AS NEEDED FOR EDEMA, Disp: 90 tablet, Rfl: 1   hydrOXYzine (ATARAX/VISTARIL) 25 MG tablet, Take 25 mg by mouth daily as needed for itching., Disp: , Rfl:    isosorbide mononitrate (IMDUR) 30 MG 24 hr tablet, Take 0.5 tablets (15 mg total) by mouth daily., Disp: 45 tablet, Rfl: 3   levothyroxine (SYNTHROID, LEVOTHROID) 75 MCG tablet, Take 75 mcg by mouth daily before breakfast. , Disp: , Rfl:    LINZESS 145 MCG CAPS capsule, Take 145 mcg by mouth daily as needed (Constipation)., Disp: , Rfl:    losartan-hydrochlorothiazide (HYZAAR) 50-12.5 MG tablet, Take 1 tablet by mouth daily., Disp: , Rfl:    Multiple Vitamin (MULTIVITAMIN WITH MINERALS) TABS, Take 1 tablet by mouth daily., Disp: , Rfl:    nitroGLYCERIN (NITROSTAT) 0.4 MG SL tablet, TAKE 1 TABLET BY MOUTH EVERY 5 MINUTES AS NEEDED FOR CHEST PAIN, Disp: 25 tablet, Rfl: 5   pregabalin (LYRICA) 100 MG capsule, Take 100 mg by mouth daily., Disp: , Rfl:    pregabalin (LYRICA) 75 MG capsule, Take 75 mg by mouth 3 (three) times daily as needed (Nerve pain)., Disp: ,  Rfl:    traMADol (ULTRAM) 50 MG tablet, Take 1 tablet (50 mg total) by mouth 2 (two) times daily as needed for moderate pain., Disp: 60 tablet, Rfl: 5 No current facility-administered medications for this visit.  Facility-Administered Medications Ordered in Other Visits:    regadenoson (LEXISCAN) injection SOLN 0.4 mg, 0.4 mg, Intravenous, Once, Pricilla Riffle, MD   technetium tetrofosmin (TC-MYOVIEW) injection 32.4 millicurie, 32.4 millicurie, Intravenous, Once PRN, Pricilla Riffle, MD  Social History   Tobacco Use  Smoking Status Never  Smokeless Tobacco Never    Allergies  Allergen Reactions   Acetaminophen Other (See Comments)    Only can take 1000 mg daily   Codeine Other (See Comments)    Patient states "she did not like the way codeine made her feel"  Other  Reaction(s): makes her feel bad   Hydrocodone Bitartrate Er Other (See Comments)    Too strong   Oxycodone Hcl Other (See Comments)    Too strong   Vicodin [Hydrocodone-Acetaminophen] Nausea And Vomiting    Doesn't like how it makes her feel but can take if needs to for pain   Objective:   There were no vitals filed for this visit.  There is no height or weight on file to calculate BMI. Constitutional Well developed. Well nourished.  Vascular Dorsalis pedis pulses palpable bilaterally. Posterior tibial pulses palpable bilaterally. Capillary refill normal to all digits.  No cyanosis or clubbing noted. Pedal hair growth normal.  Neurologic Normal speech. Oriented to person, place, and time. Epicritic sensation to light touch grossly present bilaterally.  Dermatologic Nails well groomed and normal in appearance. No open wounds. No skin lesions.  Orthopedic: Negative anterior drawer test or talar tilt test noted.  Limited range of motion noted at the ankle joint no deep intra-articular ankle pain noted.  Pain with dorsiflexion of the ankle joint likely due to the impingement of the anterior ankle.  Pain at the talonavicular joint.  Clinically able to appreciate osteophytes at the TN joint   Radiographs: IMPRESSION: 1. Nonvisualization of the anterior talofibular ligament with edema about the expected location suggesting tear, likely a chronic process. 2. Edema about the spring ligament suggesting chronic ligamentous sprain. 3. Osteochondral injury about the medial aspect of the talar dome. 4. Moderate osteoarthritis of the talonavicular joint with subchondral cystic changes and marrow edema. 5. Mild subchondral edema of the cuboid suggesting mild arthritis. 6. Subcutaneous soft tissue edema about the ankle and dorsum of the foot without fluid collection or hematoma.   Assessment:   No diagnosis found.   Plan:  Patient was evaluated and treated and all questions  answered.  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  Continue using cam boot but would like for transition to Tri-Lock ankle brace.  Given the amount of the pain that she is still having she will benefit from an MRI. -MRI was reviewed with the patient in extensive detail.  At this time clinically correlating her pain most of her pain appears to be at the talonavicular joint with moderate osteoarthritis present.  The osteochondral injury is not bothering her at the ankle joint as her pain is more at the anterior dorsal foot near the ankle. -Patient will think about PRP versus surgical fusion. -She would like some time to think over from surgical fusion -Will extend her disability indefinitely until clinical resolved    No follow-ups on file.

## 2023-03-05 ENCOUNTER — Ambulatory Visit: Payer: BC Managed Care – PPO

## 2023-03-05 ENCOUNTER — Encounter: Payer: Self-pay | Admitting: Physician Assistant

## 2023-03-05 ENCOUNTER — Ambulatory Visit (HOSPITAL_COMMUNITY): Payer: BC Managed Care – PPO | Attending: Physician Assistant

## 2023-03-05 ENCOUNTER — Other Ambulatory Visit: Payer: Self-pay | Admitting: Physician Assistant

## 2023-03-05 DIAGNOSIS — I2510 Atherosclerotic heart disease of native coronary artery without angina pectoris: Secondary | ICD-10-CM | POA: Insufficient documentation

## 2023-03-05 DIAGNOSIS — R072 Precordial pain: Secondary | ICD-10-CM | POA: Diagnosis not present

## 2023-03-05 DIAGNOSIS — I480 Paroxysmal atrial fibrillation: Secondary | ICD-10-CM | POA: Insufficient documentation

## 2023-03-05 LAB — MYOCARDIAL PERFUSION IMAGING
LV dias vol: 68 mL (ref 46–106)
LV sys vol: 24 mL
Nuc Stress EF: 65 %
Peak HR: 81 {beats}/min
Rest HR: 56 {beats}/min
Rest Nuclear Isotope Dose: 10.5 mCi
SDS: 1
SRS: 0
SSS: 1
ST Depression (mm): 0 mm
Stress Nuclear Isotope Dose: 32.4 mCi
TID: 1.01

## 2023-03-05 MED ORDER — TECHNETIUM TC 99M TETROFOSMIN IV KIT
10.5000 | PACK | Freq: Once | INTRAVENOUS | Status: AC | PRN
  Administered 2023-03-05: 10.5 via INTRAVENOUS

## 2023-03-05 MED ORDER — REGADENOSON 0.4 MG/5ML IV SOLN: 0.4000 mg | Freq: Once | INTRAVENOUS | Status: AC

## 2023-03-05 MED ORDER — TECHNETIUM TC 99M TETROFOSMIN IV KIT: 32.4000 | PACK | Freq: Once | INTRAVENOUS | Status: AC | PRN

## 2023-03-06 DIAGNOSIS — M47816 Spondylosis without myelopathy or radiculopathy, lumbar region: Secondary | ICD-10-CM | POA: Diagnosis not present

## 2023-03-06 DIAGNOSIS — G894 Chronic pain syndrome: Secondary | ICD-10-CM | POA: Diagnosis not present

## 2023-03-06 DIAGNOSIS — Z79891 Long term (current) use of opiate analgesic: Secondary | ICD-10-CM | POA: Diagnosis not present

## 2023-03-06 LAB — LIPID PANEL
Chol/HDL Ratio: 2.7 ratio (ref 0.0–4.4)
Cholesterol, Total: 156 mg/dL (ref 100–199)
HDL: 58 mg/dL (ref >39)
LDL Chol Calc (NIH): 83 mg/dL (ref 0–99)
Triglycerides: 79 mg/dL (ref 0–149)
VLDL Cholesterol Cal: 15 mg/dL (ref 5–40)

## 2023-03-09 ENCOUNTER — Other Ambulatory Visit: Payer: Self-pay | Admitting: Physician Assistant

## 2023-03-14 ENCOUNTER — Telehealth: Payer: Self-pay | Admitting: Physician Assistant

## 2023-03-14 DIAGNOSIS — E782 Mixed hyperlipidemia: Secondary | ICD-10-CM

## 2023-03-14 MED ORDER — ROSUVASTATIN CALCIUM 5 MG PO TABS: ORAL_TABLET | ORAL | 3 refills | Status: DC

## 2023-03-14 NOTE — Telephone Encounter (Signed)
Follow Up:      Patient is returning a call from last week. She is not sure who called her.

## 2023-03-14 NOTE — Telephone Encounter (Signed)
-----   Message from Beatrice Lecher, [place] sent at 03/07/2023  4:28 PM EDT ----- Results sent to Deri Fuelling via MyChart. See MyChart comments below. PLAN:  -If willing, start rosuvastatin 5 mg every Monday, Wednesday, Friday >> check Lipids, LFTs 3 months  Ms. Delaine  Your LDL cholesterol is above goal at 83.  Goal LDL cholesterol is <55.  You are on max dose Repatha.  We could try to add a very low-dose of rosuvastatin taken 3 days a week to see if we can get you to goal.  Rosuvastatin is often better tolerated than atorvastatin.  Atorvastatin is the medication you had side effects with.   Tereso Newcomer, PA-C

## 2023-03-14 NOTE — Telephone Encounter (Signed)
Follow Up:    It was about her lab results.

## 2023-03-14 NOTE — Telephone Encounter (Signed)
Patient notified.  She is willing to start Rosuvastatin as outlined below.  She will let us know if she develops issues similar to what she had with atorvastatin.  Prescription sent to [facility] [employer] at [address]  Patient will come in for fasting lab work on October 8.

## 2023-04-04 DIAGNOSIS — M25562 Pain in left knee: Secondary | ICD-10-CM | POA: Diagnosis not present

## 2023-04-04 DIAGNOSIS — M25561 Pain in right knee: Secondary | ICD-10-CM | POA: Diagnosis not present

## 2023-04-05 DIAGNOSIS — M25571 Pain in right ankle and joints of right foot: Secondary | ICD-10-CM | POA: Diagnosis not present

## 2023-04-05 DIAGNOSIS — G894 Chronic pain syndrome: Secondary | ICD-10-CM | POA: Diagnosis not present

## 2023-04-12 ENCOUNTER — Other Ambulatory Visit: Payer: Self-pay

## 2023-04-12 ENCOUNTER — Emergency Department (HOSPITAL_BASED_OUTPATIENT_CLINIC_OR_DEPARTMENT_OTHER): Payer: BC Managed Care – PPO

## 2023-04-12 ENCOUNTER — Encounter (HOSPITAL_BASED_OUTPATIENT_CLINIC_OR_DEPARTMENT_OTHER): Payer: Self-pay

## 2023-04-12 ENCOUNTER — Emergency Department (HOSPITAL_BASED_OUTPATIENT_CLINIC_OR_DEPARTMENT_OTHER)
Admission: EM | Admit: 2023-04-12 | Discharge: 2023-04-12 | Disposition: A | Discharge status: Home / Self Care | Payer: BC Managed Care – PPO | Attending: Emergency Medicine | Admitting: Emergency Medicine

## 2023-04-12 DIAGNOSIS — Z79899 Other long term (current) drug therapy: Secondary | ICD-10-CM | POA: Insufficient documentation

## 2023-04-12 DIAGNOSIS — I10 Essential (primary) hypertension: Secondary | ICD-10-CM | POA: Diagnosis not present

## 2023-04-12 DIAGNOSIS — I4891 Unspecified atrial fibrillation: Secondary | ICD-10-CM | POA: Insufficient documentation

## 2023-04-12 DIAGNOSIS — Z7901 Long term (current) use of anticoagulants: Secondary | ICD-10-CM | POA: Insufficient documentation

## 2023-04-12 DIAGNOSIS — I2510 Atherosclerotic heart disease of native coronary artery without angina pectoris: Secondary | ICD-10-CM | POA: Diagnosis not present

## 2023-04-12 DIAGNOSIS — R110 Nausea: Secondary | ICD-10-CM | POA: Diagnosis not present

## 2023-04-12 DIAGNOSIS — R0602 Shortness of breath: Secondary | ICD-10-CM | POA: Diagnosis not present

## 2023-04-12 DIAGNOSIS — R0789 Other chest pain: Secondary | ICD-10-CM | POA: Diagnosis not present

## 2023-04-12 DIAGNOSIS — R079 Chest pain, unspecified: Secondary | ICD-10-CM | POA: Insufficient documentation

## 2023-04-12 LAB — CBC WITH DIFFERENTIAL/PLATELET
Abs Immature Granulocytes: 0.03 10*3/uL (ref 0.00–0.07)
Basophils Absolute: 0.1 10*3/uL (ref 0.0–0.1)
Basophils Relative: 1 %
Eosinophils Absolute: 0 10*3/uL (ref 0.0–0.5)
Eosinophils Relative: 0 %
HCT: 39 % (ref 36.0–46.0)
Hemoglobin: 12.7 g/dL (ref 12.0–15.0)
Immature Granulocytes: 0 %
Lymphocytes Relative: 17 %
Lymphs Abs: 1.3 10*3/uL (ref 0.7–4.0)
MCH: 28.3 pg (ref 26.0–34.0)
MCHC: 32.6 g/dL (ref 30.0–36.0)
MCV: 87.1 fL (ref 80.0–100.0)
Monocytes Absolute: 0.7 10*3/uL (ref 0.1–1.0)
Monocytes Relative: 9 %
Neutro Abs: 5.9 10*3/uL (ref 1.7–7.7)
Neutrophils Relative %: 73 %
Platelets: 341 10*3/uL (ref 150–400)
RBC: 4.48 MIL/uL (ref 3.87–5.11)
RDW: 14.9 % (ref 11.5–15.5)
WBC: 8 10*3/uL (ref 4.0–10.5)
nRBC: 0 % (ref 0.0–0.2)

## 2023-04-12 LAB — COMPREHENSIVE METABOLIC PANEL
ALT: 13 U/L (ref 0–44)
AST: 18 U/L (ref 15–41)
Albumin: 4.5 g/dL (ref 3.5–5.0)
Alkaline Phosphatase: 82 U/L (ref 38–126)
Anion gap: 7 (ref 5–15)
BUN: 14 mg/dL (ref 8–23)
CO2: 29 mmol/L (ref 22–32)
Calcium: 10.5 mg/dL — ABNORMAL HIGH (ref 8.9–10.3)
Chloride: 103 mmol/L (ref 98–111)
Creatinine, Ser: 0.91 mg/dL (ref 0.44–1.00)
GFR, Estimated: >60 mL/min (ref >60)
Glucose, Bld: 106 mg/dL — ABNORMAL HIGH (ref 70–99)
Potassium: 4.1 mmol/L (ref 3.5–5.1)
Sodium: 139 mmol/L (ref 135–145)
Total Bilirubin: 0.4 mg/dL (ref 0.3–1.2)
Total Protein: 8 g/dL (ref 6.5–8.1)

## 2023-04-12 LAB — TROPONIN I (HIGH SENSITIVITY)
Troponin I (High Sensitivity): 2 ng/L (ref <18)
Troponin I (High Sensitivity): <2 ng/L (ref <18)

## 2023-04-12 MED ORDER — ASPIRIN 81 MG PO CHEW
324.0000 mg | CHEWABLE_TABLET | Freq: Once | ORAL | Status: AC
  Administered 2023-04-12: 324 mg via ORAL
  Filled 2023-04-12: qty 4

## 2023-04-12 MED ORDER — ONDANSETRON 4 MG PO TBDP
4.0000 mg | ORAL_TABLET | Freq: Once | ORAL | Status: AC
  Administered 2023-04-12: 4 mg via ORAL
  Filled 2023-04-12: qty 1

## 2023-04-12 NOTE — Discharge Instructions (Signed)
You were seen in the emergency department today for chest pain.  Your work appears been reassuring.  Please go to your cardiology appointment in 4 days.  Please return to emergency department for significantly worsening symptoms.

## 2023-04-12 NOTE — ED Triage Notes (Signed)
In for eval of sudden onset left upper chest pain this am at approx 0730 while sitting on the side of the bed. Describes as pressure, tightness, squeezing, and cramping feeling. Pain radiates to left arm. Took NTG x2. Nausea and SOB. Denies vomiting. Sees cardiologist for A-Fib and one previous MD told he she had an MI.

## 2023-04-12 NOTE — ED Provider Notes (Signed)
[place] [facility] AT [facility] Provider Note   CSN: [id number] Arrival date & time: 04/12/23  3086     History  Chief Complaint  Patient presents with   Chest Pain    Whitney Daniel is a 67 y.o. female.  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  States she got up this morning around 8 AM.  She sat on the side of the bed and then had a sudden onset of left-sided chest pain.  She describes the pain as squeezing as if somebody is grabbing her heart.  She states that it was severe and she became nauseated and felt like she needed to have a bowel movement.  She states that she was able to get up and sit on the toilet.  She states it took a while for her to be able to get up and take a nitroglycerin.  Initial nitro did not help her symptoms, she then took a second 1 with only mild improvement in pain.  She states that the pain did radiate down her left arm and her left arm feels heavy.  She does feel mildly short of breath.  She denies having back pain, palpitations, syncope.   Chest Pain Associated symptoms: nausea and shortness of breath        Home Medications Prior to Admission medications   Medication Sig Start Date End Date Taking? Authorizing Provider  albuterol (VENTOLIN HFA) 108 (90 Base) MCG/ACT inhaler Inhale 2 puffs into the lungs every 4 (four) hours as needed for wheezing or shortness of breath. 01/09/14   [provider]  amLODipine (NORVASC) 5 MG tablet TAKE 1 TABLET BY MOUTH DAILY 06/30/22   Lyn Records, MD  apixaban (ELIQUIS) 5 MG TABS tablet TAKE 1 TABLET BY MOUTH TWICE A DAY 09/04/22   Lyn Records, MD  APPLE CIDER VINEGAR PO Take 1 tablet by mouth daily. Gummies    [provider]  Cholecalciferol (VITAMIN D3 PO) Take 1 tablet by mouth  daily. Vit k2    [provider]  diclofenac Sodium (VOLTAREN) 1 % GEL Apply 2 g topically daily as needed (pain). 09/13/22   [provider]  Evolocumab (REPATHA SURECLICK) 140 MG/ML SOAJ INJECT 140 MG INTO THE SKIN EVERY 14 (FOURTEEN) DAYS. 08/21/22   Lyn Records, MD  furosemide (LASIX) 20 MG tablet TAKE 1 TABLET BY MOUTH DAILY ONLY AS NEEDED FOR EDEMA 03/09/23   Tereso Newcomer T, PA-C  hydrOXYzine (ATARAX/VISTARIL) 25 MG tablet Take 25 mg by mouth daily as needed for itching. 12/06/20   [provider]  isosorbide mononitrate (IMDUR) 30 MG 24 hr tablet Take 0.5 tablets (15 mg total) by mouth daily. 02/28/23 05/29/23  Tereso Newcomer T, PA-C  levothyroxine (SYNTHROID, LEVOTHROID) 75 MCG tablet Take 75 mcg by mouth daily before breakfast.     [provider]  LINZESS 145 MCG CAPS capsule Take 145 mcg by mouth daily as needed (Constipation). 04/08/20   [provider]  losartan-hydrochlorothiazide (HYZAAR) 50-12.5 MG tablet Take 1 tablet by mouth daily. 10/18/18   [provider]  Multiple Vitamin (MULTIVITAMIN WITH MINERALS) TABS Take 1 tablet by mouth daily.    [provider]  nitroGLYCERIN (NITROSTAT) 0.4 MG SL tablet TAKE 1 TABLET BY MOUTH EVERY 5 MINUTES AS NEEDED FOR CHEST PAIN 06/08/22   Verdis Prime  W, MD  pregabalin (LYRICA) 100 MG capsule Take 100 mg by mouth daily. 06/06/22   [provider]  pregabalin (LYRICA) 75 MG capsule Take 75 mg by mouth 3 (three) times daily as needed (Nerve pain).    [provider]  rosuvastatin (CRESTOR) 5 MG tablet Take one tablet by mouth on Monday, Wednesday and Friday 03/14/23   Tereso Newcomer T, PA-C  traMADol (ULTRAM) 50 MG tablet Take 1 tablet (50 mg total) by mouth 2 (two) times daily as needed for moderate pain. 04/26/18   Kirsteins, Victorino Sparrow, MD      Allergies    Acetaminophen, Codeine, Hydrocodone bitartrate er, Oxycodone hcl, and Vicodin [hydrocodone-acetaminophen]    Review of  Systems   Review of Systems  Respiratory:  Positive for shortness of breath.   Cardiovascular:  Positive for chest pain.  Gastrointestinal:  Positive for nausea.    Physical Exam Updated Vital Signs BP 138/87   Pulse (!) 58   Temp 99.2 F (37.3 C) (Oral)   Resp 19   Ht 5\' 8"  (1.727 m)   Wt 96.2 kg   SpO2 99%   BMI 32.23 kg/m  Physical Exam Vitals and nursing note reviewed.  Constitutional:      General: She is not in acute distress.    Appearance: Normal appearance. She is well-developed. She is not ill-appearing or toxic-appearing.  HENT:     Head: Normocephalic.  Eyes:     General: No scleral icterus. Neck:     Vascular: No JVD.  Cardiovascular:     Rate and Rhythm: Normal rate and regular rhythm.     Pulses:          Radial pulses are 2+ on the right side and 2+ on the left side.     Heart sounds: Normal heart sounds. No murmur heard.    Comments: Trace edema in the bilateral lower extremities  Pulmonary:     Effort: Pulmonary effort is normal. No tachypnea or respiratory distress.     Breath sounds: Normal breath sounds.  Chest:     Chest wall: No tenderness.  Abdominal:     General: Bowel sounds are normal.     Palpations: Abdomen is soft.  Musculoskeletal:     Cervical back: Normal range of motion.  Skin:    General: Skin is warm and dry.     Capillary Refill: Capillary refill takes less than 2 seconds.  Neurological:     General: No focal deficit present.     Mental Status: She is alert.  Psychiatric:        Mood and Affect: Mood normal.        Behavior: Behavior normal.     ED Results / Procedures / Treatments   Labs (all labs ordered are listed, but only abnormal results are displayed) Labs Reviewed  COMPREHENSIVE METABOLIC PANEL - Abnormal; Notable for the following components:      Result Value   Glucose, Bld 106 (*)    Calcium 10.5 (*)    All other components within normal limits  CBC WITH DIFFERENTIAL/PLATELET  TROPONIN I (HIGH  SENSITIVITY)  TROPONIN I (HIGH SENSITIVITY)    EKG EKG Interpretation Date/Time:  Thursday April 12 2023 10:09:06 EDT Ventricular Rate:  72 PR Interval:  156 QRS Duration:  97 QT Interval:  370 QTC Calculation: 405 R Axis:   -70  Text Interpretation: Sinus rhythm Left anterior fascicular block Abnormal R-wave progression, late transition Confirmed by Benjiman Core [phone]) on 04/12/2023  1:08:33 PM  Radiology DG Chest Port 1 View  Result Date: 04/12/2023 CLINICAL DATA:  [id number] Chest pain [id number] EXAM: PORTABLE CHEST 1 VIEW COMPARISON:  10/22/2018. FINDINGS: Bilateral lung fields are clear. Bilateral costophrenic angles are clear. Normal cardio-mediastinal silhouette, considering the AP technique. No acute osseous abnormalities. The soft tissues are within normal limits. IMPRESSION: No acute cardiopulmonary process. Electronically Signed   By: Jules Schick M.D.   On: 04/12/2023 11:12    Procedures Procedures   Medications Ordered in ED Medications  aspirin chewable tablet 324 mg (324 mg Oral Given 04/12/23 1046)  ondansetron (ZOFRAN-ODT) disintegrating tablet 4 mg (4 mg Oral Given 04/12/23 1049)    ED Course/ Medical Decision Making/ A&P   {            HEART Score: 4                    Medical Decision Making Amount and/or Complexity of Data Reviewed Labs: ordered. Radiology: ordered.  Risk OTC drugs. Prescription drug management.  Initial Impression and Ddx 67 year old female who presents to the emergency department with chest pain. Patient PMH that increases complexity of ED encounter: Hypertension, paroxysmal atrial fibrillation, hyperlipidemia, CAD Differential: Acute chest syndrome, stable angina, atypical angina, pulmonary embolism, pneumothorax, aortic dissection, pleural effusion, CHF, COPD, asthma, myocarditis, pericarditis, cardiac tamponade, chest wall pain   Interpretation of Diagnostics I independent reviewed and interpreted the labs as followed: cbc within  normal limits, CMP without electrolyte derangement, AKI or transaminitis.  Initial troponin 2.  - I independently visualized the following imaging with scope of interpretation limited to determining acute life threatening conditions related to emergency care: Chest x-ray, which revealed no acute finding  Patient Reassessment and Ultimate Disposition/Management 67 year old female who presents to the emergency department with chest pain.  On my exam, well appearing female. Non toxic in appearance. Hemodynamically stable. Chest pain story is concerning. Will obtain ACS workup. Will give ASA. Anticipate speaking with cardiology after work up.   On chart review normal stress test a month ago. 50-69% LAD stenosis on CT coronary in 2022.   EKG without ischemia or infarction, troponin x2 negative, doubt ACS  Does not appear fluid volume overloaded on exam, no edema on CXR, doubt CHF exacerbation  Considered but doubt PE. PERC1 age, Anner Crete low risk so will defer d-dimer or CTA PE study at this time. CXR without evidence of pneumonia, pleural effusion or pneumothorax. No recent illnesses and troponin negative so doubt pericarditis or myocarditis  Symptoms inconsistent with aortic dissection  HEART Score: 4   . I consulted and spoke with Dr. Cristal Deer, cardiology who feels that this is likely noncardiogenic pain.  States that the patient also has appointment in 4 days for follow-up.  At this time unclear etiology of her chest pain.  May be musculoskeletal versus other.  Instructed to use Tylenol for pain relief.  Instructed to follow-up with cardiology.  Do not feel that she needs further workup at this time.  Patient verbalized understanding.  The patient has been appropriately medically screened and/or stabilized in the ED. I have low suspicion for any other emergent medical condition which would require further screening, evaluation or treatment in the ED or require inpatient management. At time of  discharge the patient is hemodynamically stable and in no acute distress. I have discussed work-up results and diagnosis with patient and answered all questions. Patient is agreeable with discharge plan. We discussed strict return precautions for returning to the emergency  department and they verbalized understanding.     Patient management required discussion with the following services or consulting groups:  Cardiology  Complexity of Problems Addressed Acute complicated illness or Injury  Additional Data Reviewed and Analyzed Further history obtained from: Past medical history and medications listed in the EMR, Prior ED visit notes, Recent PCP notes, and Prior labs/imaging results  Patient Encounter Risk Assessment None  Final Clinical Impression(s) / ED Diagnoses Final diagnoses:  Nonspecific chest pain    Rx / DC Orders ED Discharge Orders     None         Cristopher Peru, PA-C 04/12/23 1435    Benjiman Core, MD 04/13/23 [phone]

## 2023-04-15 NOTE — Progress Notes (Deleted)
Cardiology Office Note:    Date:  04/15/2023  ID:  Deri Fuelling, DOB 04/26/1956, MRN [id number] PCP: Farris Has, MD  [facility] Providers Cardiologist:  Lesleigh Noe, MD (Inactive) { Click to update primary MD,subspecialty MD or APP then REFRESH:1}    {Click to Open Review  :1}   Patient Profile:     *** Coronary artery disease  Moderate Nonobstructive; LHC 04/16/18: oRCA 40-50, apical LAD 70, patent LCx, EF > 50 >> Med Rx CCTA 02/07/21: CAC score 4.96, 69th percentile; dist to apical LAD 50-69 (small 1.3 cm in this location), pD1 0-24, LCx patent, oRCA 0-24 Myoview 03/05/23: EF 65, normal perfusion, low risk  Paroxysmal atrial fibrillation  TTE 11/03/2018: EF 55, NL wall motion, mild BAE, interatrial septum aneurysmal, mild TR Event monitor 11/28/20: NSR, Avg HR 65, no AFib, rare PVCs, 4 beat NSVT Hypertension  Hyperlipidemia  Hypothyroidism   Fibromyxoid fibroma (?sarcoma) - s/p remote resection R thigh      {  FU for CAD AFib Last seen June 2024 with chest and L arm pain Myoview was low risk Imdur restarted   Leg edema due to venous insufficiency Lasix 20 prn Rxd  :1}     Discussed the use of AI scribe software for clinical note transcription with the patient, who gave verbal consent to proceed.  History of Present Illness          ROS: ***    Studies Reviewed:       *** Risk Assessment/Calculations:   {Does this patient have ATRIAL FIBRILLATION?:[id number]} No BP recorded.  {Refresh Note OR Click here to enter BP  :1}***       Physical Exam:   VS:  There were no vitals taken for this visit.   Wt Readings from Last 3 Encounters:  04/12/23 212 lb (96.2 kg)  03/05/23 212 lb (96.2 kg)  02/28/23 212 lb 12.8 oz (96.5 kg)    Physical Exam***     Assessment and Plan:  No problem-specific Assessment & Plan notes found for this encounter. Assessment and Plan          {  Precordial chest pain She has had symptoms of L arm and L chest pain over the past  2 weeks. Her symptoms are possibly cardiac. She does note weakness in her arm and leg weakness. She also has neck pain. Question if she may have cervical spine impingement. If cardiac workup neg, would recommend follow up with PCP to further investigate for this.  Lexiscan Myoview  Follow up 6-8 weeks.    CAD (coronary artery disease) Mod nonobstructive coronary artery disease by cath in 2019 and CCTA in 2022. She has mod apical LAD stenosis.  She was previously taken off of isosorbide.  As noted, she has left arm and left chest pain that is possibly cardiac.  She has had relief with nitroglycerin at times.  EKG today does not demonstrate any acute changes.   I will resume her isosorbide at 15 mg daily.   Proceed with Lexiscan Myoview as noted.   She does not require antiplatelet therapy as she is on Eliquis.  Continue Repatha 140 mg every 14 days, nitroglycerin as needed.  Follow-up 6 to 8 weeks.   I will have her follow-up with Dr. Lynnette Caffey and me given Dr. Michaelle Copas retirement.   Bilateral leg edema She has evidence of varicose veins on exam.  I suspect she has mainly venous insufficiency.  Her edema improves with elevation.  I have recommended compression, elevation.  I will also give her a prescription for Lasix to take as needed.  I have cautioned her to only use this if she has a particularly bad day in regards to swelling.  I have asked her to call us if she takes more than 2 doses per week so that we can obtain a follow-up BMET. Lasix 20 mg daily as needed   PAF (paroxysmal atrial fibrillation) (HCC) Sinus rhythm.  She is tolerating anticoagulation.  Labs from primary care reviewed.  In April 2024, her hemoglobin was normal at 12.2, creatinine normal at 0.81.  Continue Eliquis 5 mg twice daily.   Hypertension Blood pressure is controlled.  Continue amlodipine 5 mg daily, losartan/HCTZ 50/12.5 mg daily.   Hyperlipidemia Continue Repatha 140 mg every 14 days.  Labs from primary care  reviewed.  ALT in April 2024 was normal at 12.  Obtain fasting lipids today.    :1}    {Are you ordering a CV Procedure (e.g. stress test, cath, DCCV, TEE, etc)?   Press F2        :[id number]}  Dispo:  No follow-ups on file.  Signed, Tereso Newcomer, PA-C

## 2023-04-16 ENCOUNTER — Ambulatory Visit: Payer: BC Managed Care – PPO | Admitting: Physician Assistant

## 2023-04-16 DIAGNOSIS — I2510 Atherosclerotic heart disease of native coronary artery without angina pectoris: Secondary | ICD-10-CM

## 2023-04-16 DIAGNOSIS — I480 Paroxysmal atrial fibrillation: Secondary | ICD-10-CM

## 2023-04-25 ENCOUNTER — Encounter: Payer: Self-pay | Admitting: Podiatry

## 2023-05-03 DIAGNOSIS — M25571 Pain in right ankle and joints of right foot: Secondary | ICD-10-CM | POA: Diagnosis not present

## 2023-05-03 DIAGNOSIS — G894 Chronic pain syndrome: Secondary | ICD-10-CM | POA: Diagnosis not present

## 2023-05-11 DIAGNOSIS — E039 Hypothyroidism, unspecified: Secondary | ICD-10-CM | POA: Diagnosis not present

## 2023-05-11 DIAGNOSIS — E1169 Type 2 diabetes mellitus with other specified complication: Secondary | ICD-10-CM | POA: Diagnosis not present

## 2023-05-11 DIAGNOSIS — Z0000 Encounter for general adult medical examination without abnormal findings: Secondary | ICD-10-CM | POA: Diagnosis not present

## 2023-05-11 DIAGNOSIS — D6869 Other thrombophilia: Secondary | ICD-10-CM | POA: Diagnosis not present

## 2023-05-11 DIAGNOSIS — G8929 Other chronic pain: Secondary | ICD-10-CM | POA: Diagnosis not present

## 2023-05-11 DIAGNOSIS — E7800 Pure hypercholesterolemia, unspecified: Secondary | ICD-10-CM | POA: Diagnosis not present

## 2023-05-11 DIAGNOSIS — I10 Essential (primary) hypertension: Secondary | ICD-10-CM | POA: Diagnosis not present

## 2023-05-11 DIAGNOSIS — R252 Cramp and spasm: Secondary | ICD-10-CM | POA: Diagnosis not present

## 2023-06-06 DIAGNOSIS — H35033 Hypertensive retinopathy, bilateral: Secondary | ICD-10-CM | POA: Diagnosis not present

## 2023-06-06 DIAGNOSIS — H40013 Open angle with borderline findings, low risk, bilateral: Secondary | ICD-10-CM | POA: Diagnosis not present

## 2023-06-12 ENCOUNTER — Ambulatory Visit: Payer: BC Managed Care – PPO

## 2023-06-13 ENCOUNTER — Ambulatory Visit: Payer: BC Managed Care – PPO | Attending: Physician Assistant

## 2023-06-13 DIAGNOSIS — E782 Mixed hyperlipidemia: Secondary | ICD-10-CM | POA: Diagnosis not present

## 2023-06-14 LAB — LIPID PANEL
Chol/HDL Ratio: 2.8 {ratio} (ref 0.0–4.4)
Cholesterol, Total: 155 mg/dL (ref 100–199)
HDL: 56 mg/dL (ref >39)
LDL Chol Calc (NIH): 87 mg/dL (ref 0–99)
Triglycerides: 61 mg/dL (ref 0–149)
VLDL Cholesterol Cal: 12 mg/dL (ref 5–40)

## 2023-06-14 LAB — HEPATIC FUNCTION PANEL
ALT: 13 [IU]/L (ref 0–32)
AST: 18 [IU]/L (ref 0–40)
Albumin: 4.4 g/dL (ref 3.9–4.9)
Alkaline Phosphatase: 93 [IU]/L (ref 44–121)
Bilirubin Total: <0.2 mg/dL (ref 0.0–1.2)
Bilirubin, Direct: <0.1 mg/dL (ref 0.00–0.40)
Total Protein: 6.8 g/dL (ref 6.0–8.5)

## 2023-06-20 ENCOUNTER — Telehealth: Payer: Self-pay | Admitting: *Deleted

## 2023-06-20 DIAGNOSIS — Z79891 Long term (current) use of opiate analgesic: Secondary | ICD-10-CM | POA: Diagnosis not present

## 2023-06-20 DIAGNOSIS — G894 Chronic pain syndrome: Secondary | ICD-10-CM | POA: Diagnosis not present

## 2023-06-20 DIAGNOSIS — M25562 Pain in left knee: Secondary | ICD-10-CM | POA: Diagnosis not present

## 2023-06-20 DIAGNOSIS — E782 Mixed hyperlipidemia: Secondary | ICD-10-CM

## 2023-06-20 DIAGNOSIS — M545 Low back pain, unspecified: Secondary | ICD-10-CM | POA: Diagnosis not present

## 2023-06-20 MED ORDER — EZETIMIBE 10 MG PO TABS
10.0000 mg | ORAL_TABLET | Freq: Every day | ORAL | 3 refills | Status: DC
Start: 2023-06-20 — End: 2023-06-27

## 2023-06-20 NOTE — Telephone Encounter (Signed)
-----   Message from Tereso Newcomer sent at 06/15/2023  1:33 PM EDT ----- Weldon Picking is not a statin. If she is willing, I would try Zetia 10 mg once daily. Stay off Crestor. Continue Repatha.  Check Lipids, ALT in 3 mos. Tereso Newcomer, PA-C    06/15/2023 1:33 PM

## 2023-06-26 NOTE — Progress Notes (Unsigned)
Cardiology Office Note:    Date:  06/27/2023  ID:  Whitney Daniel, DOB 1955/12/14, MRN [id number] PCP: Farris Has, MD  [facility] Providers Cardiologist:  Orbie Pyo, MD Cardiology APP:  Beatrice Lecher, PA-C       Patient Profile:      Coronary artery disease  Moderate Nonobstructive; LHC 04/16/18: oRCA 40-50, apical LAD 70, patent LCx, EF > 50 >> Med Rx CCTA 02/07/21: CAC score 4.96, 69th percentile; dist to apical LAD 50-69 (small 1.3 cm in this location), pD1 0-24, LCx patent, oRCA 0-24 Myoview 03/05/23: EF 65, normal perfusion, low risk Paroxysmal atrial fibrillation  TTE 11/03/2018: EF 55, NL wall motion, mild BAE, interatrial septum aneurysmal, mild TR Event monitor 11/28/20: NSR, Avg HR 65, no AFib, rare PVCs, 4 beat NSVT Hypertension  Hyperlipidemia  Hypothyroidism   Fibromyxoid fibroma (?sarcoma) - s/p remote resection R thigh          History of Present Illness:  Discussed the use of AI scribe software for clinical note transcription with the patient, who gave verbal consent to proceed.  Whitney Daniel is a 67 y.o. female who returns for follow up of CAD. She was last seen in 02/2023. She was having L arm and chest pain. A Myoview was obtained and was low risk.   She is here alone. She notes occasional chest pain, which seems to be non-cardiac. She has not had shortness of breath, syncope, or orthopnea. The patient denies any leg swelling, except for a minor swelling that improves with elevation. She did not restart Imdur. She also did not start on Zetia due to concerns for side effects.      ROS   See HPI     Studies Reviewed:        Results   LABS Hb: 11.7 g/dL (19/62/2297) Cr: 0.9 mg/dL (98/92/1194) LDL: 87 mg/dL (17/40/8144)      Risk Assessment/Calculations:    CHA2DS2-VASc Score = 4   This indicates a 4.8% annual risk of stroke. The patient's score is based upon: CHF History: 0 HTN History: 1 Diabetes History: 0 Stroke History:  0 Vascular Disease History: 1 Age Score: 1 Gender Score: 1            Physical Exam:   VS:  BP 137/80   Pulse 64   Ht 5\' 8"  (1.727 m)   Wt 207 lb 9.6 oz (94.2 kg)   SpO2 98%   BMI 31.57 kg/m    Wt Readings from Last 3 Encounters:  06/27/23 207 lb 9.6 oz (94.2 kg)  04/12/23 212 lb (96.2 kg)  03/05/23 212 lb (96.2 kg)    Constitutional:      Appearance: Healthy appearance. Not in distress.  Neck:     Vascular: JVD normal.  Pulmonary:     Breath sounds: Normal breath sounds. No wheezing. No rales.  Cardiovascular:     Normal rate. Regular rhythm.     Murmurs: There is no murmur.  Edema:    Peripheral edema absent.  Abdominal:     Palpations: Abdomen is soft.        Assessment and Plan:   Assessment & Plan Coronary artery disease involving native coronary artery of native heart without angina pectoris Moderate non-obstructive disease on cardiac catheterization in 2019 and coronary CTA in 2022. Recent nuclear stress test in July 2024 was low risk. Occasional chest pain, non-cardiac in nature. No further testing needed at this time. -Continue Repatha 140mg  every two  weeks. -Use Nitroglycerin as needed. PAF (paroxysmal atrial fibrillation) (HCC) Maintaining sinus rhythm. Echocardiogram in March 2024 showed normal ejection fraction. Tolerating anticoagulation well. -Continue Eliquis 5mg  twice daily. Essential hypertension Blood pressure controlled. -Continue Amlodipine 5mg  daily and Losartan/HCTZ 50/12.5mg  daily. Mixed hyperlipidemia LDL above goal (87 in October 2024). Goal is less than 70. Intolerant to statins and concerned about taking Ezetimibe. Interested in Bempedoic acid. -Work on diet and repeat fasting lipids in three months. -If LDL remains above 70, refer to PharmD clinic for initiation of Bempedoic acid.         Dispo:  Return in about 6 months (around 12/26/2023) for Routine Follow Up, w/ Dr. Lynnette Caffey, or Tereso Newcomer, PA-C.  Signed, Tereso Newcomer, PA-C

## 2023-06-27 ENCOUNTER — Ambulatory Visit: Payer: BC Managed Care – PPO | Attending: Physician Assistant | Admitting: Physician Assistant

## 2023-06-27 ENCOUNTER — Encounter: Payer: Self-pay | Admitting: Physician Assistant

## 2023-06-27 VITALS — BP 137/80 | HR 64 | Ht 68.0 in | Wt 207.6 lb

## 2023-06-27 DIAGNOSIS — I10 Essential (primary) hypertension: Secondary | ICD-10-CM

## 2023-06-27 DIAGNOSIS — I2510 Atherosclerotic heart disease of native coronary artery without angina pectoris: Secondary | ICD-10-CM

## 2023-06-27 DIAGNOSIS — E782 Mixed hyperlipidemia: Secondary | ICD-10-CM | POA: Diagnosis not present

## 2023-06-27 DIAGNOSIS — I480 Paroxysmal atrial fibrillation: Secondary | ICD-10-CM

## 2023-06-27 NOTE — Assessment & Plan Note (Signed)
Maintaining sinus rhythm. Echocardiogram in March 2024 showed normal ejection fraction. Tolerating anticoagulation well. -Continue Eliquis 5mg  twice daily.

## 2023-06-27 NOTE — Patient Instructions (Signed)
Medication Instructions:  Your physician recommends that you continue on your current medications as directed. Please refer to the Current Medication list given to you today.  *If you need a refill on your cardiac medications before your next appointment, please call your pharmacy*   Lab Work: None ordered  If you have labs (blood work) drawn today and your tests are completely normal, you will receive your results only by: MyChart Message (if you have MyChart) OR A paper copy in the mail If you have any lab test that is abnormal or we need to change your treatment, we will call you to review the results.   Testing/Procedures: None ordered   Follow-Up: At [facility], you and your health needs are our priority.  As part of our continuing mission to provide you with exceptional heart care, we have created designated Provider Care Teams.  These Care Teams include your primary Cardiologist (physician) and Advanced Practice Providers (APPs -  Physician Assistants and Nurse Practitioners) who all work together to provide you with the care you need, when you need it.  We recommend signing up for the patient portal called "MyChart".  Sign up information is provided on this After Visit Summary.  MyChart is used to connect with patients for Virtual Visits (Telemedicine).  Patients are able to view lab/test results, encounter notes, upcoming appointments, etc.  Non-urgent messages can be sent to your provider as well.   To learn more about what you can do with MyChart, go to [email or web address].    Your next appointment:   6 month(s)  Provider:   Orbie Pyo, MD  or Tereso Newcomer, PA-C         Other Instructions

## 2023-06-27 NOTE — Assessment & Plan Note (Signed)
Moderate non-obstructive disease on cardiac catheterization in 2019 and coronary CTA in 2022. Recent nuclear stress test in July 2024 was low risk. Occasional chest pain, non-cardiac in nature. No further testing needed at this time. -Continue Repatha 140mg  every two weeks. -Use Nitroglycerin as needed.

## 2023-06-27 NOTE — Assessment & Plan Note (Signed)
LDL above goal (87 in October 2024). Goal is less than 70. Intolerant to statins and concerned about taking Ezetimibe. Interested in Bempedoic acid. -Work on diet and repeat fasting lipids in three months. -If LDL remains above 70, refer to PharmD clinic for initiation of Bempedoic acid.

## 2023-07-18 DIAGNOSIS — M47816 Spondylosis without myelopathy or radiculopathy, lumbar region: Secondary | ICD-10-CM | POA: Diagnosis not present

## 2023-07-18 DIAGNOSIS — M545 Low back pain, unspecified: Secondary | ICD-10-CM | POA: Diagnosis not present

## 2023-07-18 DIAGNOSIS — G894 Chronic pain syndrome: Secondary | ICD-10-CM | POA: Diagnosis not present

## 2023-07-18 DIAGNOSIS — Z79891 Long term (current) use of opiate analgesic: Secondary | ICD-10-CM | POA: Diagnosis not present

## 2023-07-18 DIAGNOSIS — M25562 Pain in left knee: Secondary | ICD-10-CM | POA: Diagnosis not present

## 2023-08-08 DIAGNOSIS — M25561 Pain in right knee: Secondary | ICD-10-CM | POA: Diagnosis not present

## 2023-08-08 DIAGNOSIS — M25562 Pain in left knee: Secondary | ICD-10-CM | POA: Diagnosis not present

## 2023-08-09 ENCOUNTER — Encounter: Payer: Self-pay | Admitting: Podiatry

## 2023-08-09 ENCOUNTER — Ambulatory Visit: Payer: BC Managed Care – PPO | Admitting: Podiatry

## 2023-08-09 DIAGNOSIS — M779 Enthesopathy, unspecified: Secondary | ICD-10-CM | POA: Diagnosis not present

## 2023-08-09 DIAGNOSIS — M19079 Primary osteoarthritis, unspecified ankle and foot: Secondary | ICD-10-CM

## 2023-08-09 MED ORDER — TRIAMCINOLONE ACETONIDE 10 MG/ML IJ SUSP
10.0000 mg | Freq: Once | INTRAMUSCULAR | Status: AC
Start: 2023-08-09 — End: 2023-08-09
  Administered 2023-08-09: 10 mg via INTRA_ARTICULAR

## 2023-08-09 NOTE — Progress Notes (Signed)
Subjective:   Patient ID: Whitney Daniel, female   DOB: 67 y.o.   MRN: [id number]   HPI Patient presents stating the ankle pain is really bothering her and she is trying to work but she is desperate for some short-term care through the holidays and wants to consider surgery by Dr. 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  I did do a careful injection into the ankle joint right 3 mg dexamethasone Kenalog 5 mg Xylocaine to try to give relief temporary and patient will be seen back by Dr. Allena Katz after the first of the year.  Also discussed the possibility for anterior tibial pathology that we will need to be evaluated     Plan:  Above goes over the plan for this patient including the injection the patient excepted risk for

## 2023-08-16 ENCOUNTER — Emergency Department (HOSPITAL_BASED_OUTPATIENT_CLINIC_OR_DEPARTMENT_OTHER)
Admission: EM | Admit: 2023-08-16 | Discharge: 2023-08-16 | Disposition: A | Discharge status: Home / Self Care | Payer: BC Managed Care – PPO | Attending: Emergency Medicine | Admitting: Emergency Medicine

## 2023-08-16 ENCOUNTER — Emergency Department (HOSPITAL_BASED_OUTPATIENT_CLINIC_OR_DEPARTMENT_OTHER): Payer: BC Managed Care – PPO

## 2023-08-16 ENCOUNTER — Other Ambulatory Visit: Payer: Self-pay

## 2023-08-16 ENCOUNTER — Encounter (HOSPITAL_BASED_OUTPATIENT_CLINIC_OR_DEPARTMENT_OTHER): Payer: Self-pay | Admitting: Emergency Medicine

## 2023-08-16 DIAGNOSIS — R519 Headache, unspecified: Secondary | ICD-10-CM | POA: Diagnosis not present

## 2023-08-16 DIAGNOSIS — R079 Chest pain, unspecified: Secondary | ICD-10-CM | POA: Diagnosis not present

## 2023-08-16 DIAGNOSIS — R0789 Other chest pain: Secondary | ICD-10-CM | POA: Diagnosis not present

## 2023-08-16 DIAGNOSIS — E039 Hypothyroidism, unspecified: Secondary | ICD-10-CM | POA: Diagnosis not present

## 2023-08-16 DIAGNOSIS — I10 Essential (primary) hypertension: Secondary | ICD-10-CM | POA: Insufficient documentation

## 2023-08-16 DIAGNOSIS — Z859 Personal history of malignant neoplasm, unspecified: Secondary | ICD-10-CM | POA: Insufficient documentation

## 2023-08-16 DIAGNOSIS — R059 Cough, unspecified: Secondary | ICD-10-CM | POA: Diagnosis not present

## 2023-08-16 DIAGNOSIS — I2510 Atherosclerotic heart disease of native coronary artery without angina pectoris: Secondary | ICD-10-CM | POA: Diagnosis not present

## 2023-08-16 DIAGNOSIS — Z1152 Encounter for screening for COVID-19: Secondary | ICD-10-CM | POA: Insufficient documentation

## 2023-08-16 DIAGNOSIS — J439 Emphysema, unspecified: Secondary | ICD-10-CM | POA: Diagnosis not present

## 2023-08-16 LAB — CBC
HCT: 38.2 % (ref 36.0–46.0)
Hemoglobin: 12.3 g/dL (ref 12.0–15.0)
MCH: 28 pg (ref 26.0–34.0)
MCHC: 32.2 g/dL (ref 30.0–36.0)
MCV: 87 fL (ref 80.0–100.0)
Platelets: 278 10*3/uL (ref 150–400)
RBC: 4.39 MIL/uL (ref 3.87–5.11)
RDW: 14.5 % (ref 11.5–15.5)
WBC: 6.5 10*3/uL (ref 4.0–10.5)
nRBC: 0 % (ref 0.0–0.2)

## 2023-08-16 LAB — BASIC METABOLIC PANEL
Anion gap: 9 (ref 5–15)
BUN: 18 mg/dL (ref 8–23)
CO2: 23 mmol/L (ref 22–32)
Calcium: 10.1 mg/dL (ref 8.9–10.3)
Chloride: 104 mmol/L (ref 98–111)
Creatinine, Ser: 0.88 mg/dL (ref 0.44–1.00)
GFR, Estimated: >60 mL/min (ref >60)
Glucose, Bld: 110 mg/dL — ABNORMAL HIGH (ref 70–99)
Potassium: 3.8 mmol/L (ref 3.5–5.1)
Sodium: 136 mmol/L (ref 135–145)

## 2023-08-16 LAB — RESP PANEL BY RT-PCR (RSV, FLU A&B, COVID)  RVPGX2
Influenza A by PCR: NEGATIVE
Influenza B by PCR: NEGATIVE
Resp Syncytial Virus by PCR: NEGATIVE
SARS Coronavirus 2 by RT PCR: NEGATIVE

## 2023-08-16 LAB — TROPONIN I (HIGH SENSITIVITY): Troponin I (High Sensitivity): 2 ng/L (ref <18)

## 2023-08-16 NOTE — ED Provider Notes (Signed)
Patient turned over to me.  Patient's labs without any significant abnormalities.  Initial troponin was 2 respiratory panel negative.  Chest x-ray without any acute findings.  Maybe some central vascular distention but no overt edema.  Some evidence of emphysema.  Patient stable for discharge home   Vanetta Mulders, MD 08/16/23 [phone]

## 2023-08-16 NOTE — Discharge Instructions (Signed)
Workup here today without any acute findings.  Labs without any abnormalities chest x-ray was normal respiratory panel negative.  No evidence of acute cardiac event.  Follow-up with your doctor.  Based on your symptoms would recommend using Mucinex DM 12-hour tablet.  Make an appointment to follow-up with your doctor as needed.  Return for any new or worse symptoms.

## 2023-08-16 NOTE — ED Provider Notes (Signed)
[facility]GENCY [facility]PT [facility] Emergency Department Provider Note MRN:  [id number]  Arrival date & time: 08/16/23     Chief Complaint   URI   History of Present Illness   Whitney Daniel is a 67 y.o. year-old female with a history of A-fib, CAD presenting to the ED with chief complaint of URI.  Some recent cough and sore throat, occasional headaches over the past few days.  This morning had some burning chest pain upon wakening.  No shortness of breath, no leg pain or swelling.  Wanted to make sure she did not have pneumonia or bronchitis.  Review of Systems  A thorough review of systems was obtained and all systems are negative except as noted in the HPI and PMH.   Patient's Health History    Past Medical History:  Diagnosis Date   A-fib (HCC)    Anemia    CAD (coronary artery disease) 02/26/2023   Moderate Nonobstructive; LHC 04/16/18: oRCA 40-50, apical LAD 70, patent LCx, EF > 50 >> Med Rx // CCTA 02/07/21: CAC score 4.96, 69th percentile; dist to apical LAD 50-69 (small 1.3 cm in this location), pD1 0-24, LCx patent, oRCA 0-24  //  Lexiscan Myoview 03/05/2023: EF 65, normal perfusion, low risk   Cancer (HCC)    Fibromyalgia    Hypertension    Hypothyroidism    Osteoarthritis    SVD (spontaneous vaginal delivery)    x 5    Past Surgical History:  Procedure Laterality Date   COLONOSCOPY     COLONOSCOPY WITH PROPOFOL N/A 10/13/2022   Procedure: COLONOSCOPY WITH PROPOFOL;  Surgeon: Jeani Hawking, MD;  Location: [facility];  Service: Gastroenterology;  Laterality: N/A;   DILATATION & CURRETTAGE/HYSTEROSCOPY WITH RESECTOCOPE N/A 07/15/2014   Procedure: DILATATION & CURETTAGE/HYSTEROSCOPY WITH RESECTOCOPE;  Surgeon: Serita Kyle, MD;  Location: [facility];  Service: Gynecology;  Laterality: N/A;   DILATION AND CURETTAGE OF UTERUS     polyps   LEFT HEART CATH AND CORONARY ANGIOGRAPHY N/A 04/16/2018   Procedure: LEFT HEART CATH AND CORONARY ANGIOGRAPHY;  Surgeon:  Lyn Records, MD;  Location: [facility] INVASIVE CV LAB;  Service: Cardiovascular;  Laterality: N/A;   right thigh surgery     cancer   WISDOM TOOTH EXTRACTION      Family History  Problem Relation Age of Onset   Hypertension Mother    CVA Mother    Cancer Father    Diabetes Father    Diabetes Sister    Heart attack Sister     Social History   Socioeconomic History   Marital status: Married    Spouse name: Not on file   Number of children: Not on file   Years of education: Not on file   Highest education level: Not on file  Occupational History   Not on file  Tobacco Use   Smoking status: Never   Smokeless tobacco: Never  Vaping Use   Vaping status: Never Used  Substance and Sexual Activity   Alcohol use: Yes    Alcohol/week: 0.0 standard drinks of alcohol    Comment: social - wine   Drug use: No   Sexual activity: Yes    Birth control/protection: Post-menopausal  Other Topics Concern   Not on file  Social History Narrative   Lives alone in a one story home.  Has 5 children.  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  Social Drivers of Corporate investment banker Strain: Not on file  Food Insecurity: Not on file  Transportation Needs: Not on file  Physical Activity: Unknown (07/06/2017)   Received [facility], [facility]   Exercise Vital Sign    Days of Exercise per Week: 0 days    Minutes of Exercise per Session: Not on file  Stress: Not on file  Social Connections: Not on file  Intimate Partner Violence: Not on file     Physical Exam   Vitals:   08/16/23 0620  BP: 135/88  Pulse: 78  Resp: 20  Temp: 98.7 F (37.1 C)  SpO2: 100%    CONSTITUTIONAL: Well-appearing, NAD NEURO/PSYCH:  Alert and oriented x 3, no focal deficits EYES:  eyes equal and reactive ENT/NECK:  no LAD, no JVD CARDIO: Regular rate, well-perfused,  normal S1 and S2 PULM:  CTAB no wheezing or rhonchi GI/GU:  non-distended, non-tender MSK/SPINE:  No gross deformities, no edema SKIN:  no rash, atraumatic   *Additional and/or pertinent findings included in MDM below  Diagnostic and Interventional Summary    EKG Interpretation Date/Time:  Thursday August 16 2023 06:53:50 EST Ventricular Rate:  63 PR Interval:  161 QRS Duration:  97 QT Interval:  389 QTC Calculation: 399 R Axis:   -69  Text Interpretation: Sinus rhythm Left anterior fascicular block Low voltage, precordial leads Consider anterior infarct ST elevation, consider inferior injury Confirmed by Kennis Carina [phone]) on 08/16/2023 6:56:51 AM       Labs Reviewed  RESP PANEL BY RT-PCR (RSV, FLU A&B, COVID)  [id number]  CBC  BASIC METABOLIC PANEL  TROPONIN I (HIGH SENSITIVITY)    DG Chest Port 1 View    (Results Pending)    Medications - No data to display   Procedures  /  Critical Care Procedures  ED Course and Medical Decision Making  Initial Impression and Ddx Favoring benign cause of burning chest pain such as MSK, bronchitis, GERD.  History of CAD however and so considering ACS as well.  Highly doubt PE.  Past medical/surgical history that increases complexity of ED encounter: CAD though recent catheterization in 2019 was largely clean.  A few years later had a coronary CT putting her at the 69th percentile for age.  Interpretation of Diagnostics I personally reviewed the EKG and my interpretation is as follows: Sinus rhythm without concerning ischemic findings  Labs pending  Patient Reassessment and Ultimate Disposition/Management     Signed out to oncoming provider at shift change, if reassuring workup would anticipate discharge.  Patient management required discussion with the following services or consulting groups:  None  Complexity of Problems Addressed Acute illness or injury that poses threat of life of bodily function  Additional Data  Reviewed and Analyzed Further history obtained from: Prior labs/imaging results  Additional Factors Impacting ED Encounter Risk None  Elmer Sow. Pilar Plate, MD [facility] Emergency Medicine [facility] [email or web address]  Final Clinical Impressions(s) / ED Diagnoses     ICD-10-CM   1. Chest pain, unspecified type  R07.9       ED Discharge Orders     None        Discharge Instructions Discussed with and Provided to Patient:   Discharge Instructions   None      Sabas Sous, MD 08/16/23 [phone]

## 2023-08-16 NOTE — ED Triage Notes (Signed)
Pt states chest and sinus congestion, headache, sore throat. States burning in lungs when after coughing. Took covid test at home was negative.

## 2023-08-21 DIAGNOSIS — M25571 Pain in right ankle and joints of right foot: Secondary | ICD-10-CM | POA: Diagnosis not present

## 2023-08-21 DIAGNOSIS — J988 Other specified respiratory disorders: Secondary | ICD-10-CM | POA: Diagnosis not present

## 2023-08-21 DIAGNOSIS — G894 Chronic pain syndrome: Secondary | ICD-10-CM | POA: Diagnosis not present

## 2023-08-21 DIAGNOSIS — R9389 Abnormal findings on diagnostic imaging of other specified body structures: Secondary | ICD-10-CM | POA: Diagnosis not present

## 2023-08-23 ENCOUNTER — Telehealth: Payer: Self-pay | Admitting: Podiatry

## 2023-08-23 NOTE — Telephone Encounter (Signed)
Called pt regarding paperwork. Pt stated she brought in and dropped off the paperwork on 08/09/23 when she saw Dr. Charlsie Merles. States Raynelle Fanning told her to bring paperwork when she came for that paperwork. Pt stated she spoke to Lexa on 12/09 that she was sending it to her. Stated Raynelle Fanning tried calling her early Monday morning and pt was unable to answer phone and stated she tried calling Raynelle Fanning back but was unable to get her after that. I just got off the phone with pt and she is filling out her portion of the paperwork and I will work on it and fax it back as today is the last day to turn it in or she will lose her job.

## 2023-08-23 NOTE — Telephone Encounter (Signed)
Called and spoke to Ravielle with [employer] to see if pts paperwork has been received. Ravielle stated is has not been received yet, that it can take anywhere from 4 hours to 24 hours to be received and attached to the claim. I told her this pt needed Arline Asp her claim adjustor to get this paperwork today or she would loose her job. Ravielle stated that even if they get the paperwork tomorrow, it would show what date and time it was received and count for that day.

## 2023-08-23 NOTE — Telephone Encounter (Signed)
Called pt to let her know that I called and spoke to Ravielle with [employer] to see if they have received her paperwork from Dr. Allena Katz. I informed her what Ravielle told me regarding the status and how long it can take them to get the faxes. Pt thanked me very much for my compassion and help today. I told Ms. Coppens to call us if she needs [place] for anything.

## 2023-08-24 ENCOUNTER — Telehealth: Payer: Self-pay | Admitting: Pharmacist

## 2023-08-24 MED ORDER — REPATHA SURECLICK 140 MG/ML ~~LOC~~ SOAJ: 140.0000 mg | SUBCUTANEOUS | 3 refills | Status: DC

## 2023-08-24 NOTE — Telephone Encounter (Signed)
Refill request received via fax - prescription sent to [facility].

## 2023-09-12 NOTE — Progress Notes (Deleted)
 Whitney Daniel, female    DOB: 03-Jan-1956   MRN: [id number]   Brief patient profile:  45  *** referred to pulmonary clinic 09/13/2023 by *** for ***        History of Present Illness  09/13/2023  Pulmonary/ 1st office eval/Dwana Garin  No chief complaint on file.    Dyspnea:  *** Cough: *** Sleep: *** SABA use: *** 02 use:*** LDSCT:***  No obvious day to day or daytime pattern/variability or assoc excess/ purulent sputum or mucus plugs or hemoptysis or cp or chest tightness, subjective wheeze or overt sinus or hb symptoms.    Also denies any obvious fluctuation of symptoms with weather or environmental changes or other aggravating or alleviating factors except as outlined above   No unusual exposure hx or h/o childhood pna/ asthma or knowledge of premature birth.  Current Allergies, Complete Past Medical History, Past Surgical History, Family History, and Social History were reviewed in [employer] record.  ROS  The following are not active complaints unless bolded Hoarseness, sore throat, dysphagia, dental problems, itching, sneezing,  nasal congestion or discharge of excess mucus or purulent secretions, ear ache,   fever, chills, sweats, unintended wt loss or wt gain, classically pleuritic or exertional cp,  orthopnea pnd or arm/hand swelling  or leg swelling, presyncope, palpitations, abdominal pain, anorexia, nausea, vomiting, diarrhea  or change in bowel habits or change in bladder habits, change in stools or change in urine, dysuria, hematuria,  rash, arthralgias, visual complaints, headache, numbness, weakness or ataxia or problems with walking or coordination,  change in mood or  memory.             Outpatient Medications Prior to Visit  Medication Sig Dispense Refill   albuterol  (VENTOLIN  HFA) 108 (90 Base) MCG/ACT inhaler Inhale 2 puffs into the lungs every 4 (four) hours as needed for wheezing or shortness of breath.     amLODipine  (NORVASC ) 5 MG tablet  TAKE 1 TABLET BY MOUTH DAILY 90 tablet 1   apixaban  (ELIQUIS ) 5 MG TABS tablet TAKE 1 TABLET BY MOUTH TWICE A DAY 180 tablet 1   APPLE CIDER VINEGAR PO Take 1 tablet by mouth daily. Gummies     Cholecalciferol  (VITAMIN D3 PO) Take 1 tablet by mouth daily. Vit k2     diclofenac Sodium (VOLTAREN) 1 % GEL Apply 2 g topically daily as needed (pain).     Evolocumab  (REPATHA  SURECLICK) 140 MG/ML SOAJ Inject 140 mg into the skin every 14 (fourteen) days. 6 mL 3   furosemide  (LASIX ) 20 MG tablet TAKE 1 TABLET BY MOUTH DAILY ONLY AS NEEDED FOR EDEMA 90 tablet 1   hydrOXYzine  (ATARAX /VISTARIL ) 25 MG tablet Take 25 mg by mouth daily as needed for itching.     levothyroxine  (SYNTHROID , LEVOTHROID) 75 MCG tablet Take 75 mcg by mouth daily before breakfast.      LINZESS 145 MCG CAPS capsule Take 145 mcg by mouth daily as needed (Constipation).     losartan-hydrochlorothiazide  (HYZAAR) 50-12.5 MG tablet Take 1 tablet by mouth daily.     Multiple Vitamin (MULTIVITAMIN WITH MINERALS) TABS Take 1 tablet by mouth daily.     nitroGLYCERIN  (NITROSTAT ) 0.4 MG SL tablet TAKE 1 TABLET BY MOUTH EVERY 5 MINUTES AS NEEDED FOR CHEST PAIN 25 tablet 5   pregabalin  (LYRICA ) 100 MG capsule Take 100 mg by mouth as needed (for pain).     pregabalin  (LYRICA ) 75 MG capsule Take 75 mg by mouth 3 (three) times  daily as needed (Nerve pain).     traMADol  (ULTRAM ) 50 MG tablet Take 1 tablet (50 mg total) by mouth 2 (two) times daily as needed for moderate pain. 60 tablet 5   Facility-Administered Medications Prior to Visit  Medication Dose Route Frequency Provider Last Rate Last Admin   regadenoson  (LEXISCAN ) injection SOLN 0.4 mg  0.4 mg Intravenous Once Ross, Paula V, MD       technetium tetrofosmin  (TC-MYOVIEW ) injection 32.4 millicurie  32.4 millicurie Intravenous Once PRN Okey Vina GAILS, MD        Past Medical History:  Diagnosis Date   A-fib [facility])    Anemia    CAD (coronary artery disease) 02/26/2023   Moderate  Nonobstructive; LHC 04/16/18: oRCA 40-50, apical LAD 70, patent LCx, EF > 50 >> Med Rx // CCTA 02/07/21: CAC score 4.96, 69th percentile; dist to apical LAD 50-69 (small 1.3 cm in this location), pD1 0-24, LCx patent, oRCA 0-24  //  Lexiscan  Myoview  03/05/2023: EF 65, normal perfusion, low risk   Cancer (HCC)    Fibromyalgia    Hypertension    Hypothyroidism    Osteoarthritis    SVD (spontaneous vaginal delivery)    x 5      Objective:     There were no vitals taken for this visit.         Assessment   No problem-specific Assessment & Plan notes found for this encounter.     Ozell America, MD 09/12/2023

## 2023-09-13 ENCOUNTER — Institutional Professional Consult (permissible substitution): Payer: BC Managed Care – PPO | Admitting: Internal Medicine

## 2023-09-13 ENCOUNTER — Ambulatory Visit (INDEPENDENT_AMBULATORY_CARE_PROVIDER_SITE_OTHER): Payer: BC Managed Care – PPO | Admitting: Internal Medicine

## 2023-09-13 ENCOUNTER — Encounter: Payer: Self-pay | Admitting: Internal Medicine

## 2023-09-13 ENCOUNTER — Ambulatory Visit (INDEPENDENT_AMBULATORY_CARE_PROVIDER_SITE_OTHER): Payer: BC Managed Care – PPO

## 2023-09-13 VITALS — BP 136/80 | HR 70 | Temp 99.1°F | Ht 67.0 in | Wt 202.0 lb

## 2023-09-13 DIAGNOSIS — R058 Other specified cough: Secondary | ICD-10-CM

## 2023-09-13 DIAGNOSIS — R059 Cough, unspecified: Secondary | ICD-10-CM | POA: Diagnosis not present

## 2023-09-13 MED ORDER — FAMOTIDINE 20 MG PO TABS
ORAL_TABLET | ORAL | 11 refills | Status: AC
Start: 2023-09-13 — End: ?

## 2023-09-13 MED ORDER — PANTOPRAZOLE SODIUM 40 MG PO TBEC: 40.0000 mg | DELAYED_RELEASE_TABLET | Freq: Every day | ORAL | 2 refills | Status: DC

## 2023-09-13 MED ORDER — PREDNISONE 10 MG PO TABS: ORAL_TABLET | ORAL | 0 refills | Status: DC

## 2023-09-13 NOTE — Progress Notes (Signed)
 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   History of Present Illness  09/13/2023  Pulmonary/ 1st office eval/Kody Brandl  Chief Complaint  Patient presents with   Consult    Cough and chest congestion.  Seen at [facility] on 08/16/2023.  ABN cxr - would like to discuss.  Dyspnea:  [place]es [place]ing [place] [employer] 15-20 min slt below pacing  Cough: dry present on awakening/ worse with talking/ not with ex or perfume  - globus sensation  Sleep: bed is flat/ semi erect with pillows 45 degrees x years and still coughs all night SABA use: no better  02 ldz:wnwz Nasal symptoms resolved  Some urinary incont and gen chest burning p coughing fits   No obvious day to day or daytime pattern/variability or assoc excess/ purulent sputum or mucus plugs or hemoptysis or cp or chest tightness, subjective wheeze or overt sinus or hb symptoms.    Also denies any obvious fluctuation of symptoms with weather or environmental changes or other aggravating or alleviating factors except as outlined above   No unusual exposure hx or h/o childhood pna/ asthma or knowledge of premature birth.  Current Allergies, Complete Past Medical History, Past Surgical History, Family History, and Social History were reviewed in [employer] record.  ROS  The following are not active complaints unless bolded Hoarseness, sore throat/globus  dysphagia, dental problems, itching, sneezing,  nasal congestion or discharge of excess mucus or purulent secretions, ear ache,   fever, chills, sweats, unintended wt loss or wt gain,  classically pleuritic or exertional cp,  orthopnea pnd or arm/hand swelling  or leg swelling, presyncope, palpitations, abdominal pain, anorexia, nausea, vomiting, diarrhea  or change in bowel habits or change in bladder habits, change in stools or change in urine, dysuria, hematuria,  rash, arthralgias, visual complaints, headache, numbness, weakness or ataxia or problems with walking or coordination,  change in mood or  memory.             Outpatient Medications Prior to Visit  Medication Sig Dispense Refill   albuterol  (VENTOLIN  HFA) 108 (90 Base) MCG/ACT inhaler Inhale 2 puffs into the lungs every 4 (four) hours as needed for wheezing or shortness of breath.     amLODipine  (NORVASC ) 5 MG tablet TAKE 1 TABLET BY MOUTH DAILY 90 tablet 1   apixaban  (ELIQUIS ) 5 MG TABS tablet TAKE 1 TABLET BY MOUTH TWICE A DAY 180 tablet 1   APPLE CIDER VINEGAR PO Take 1 tablet by mouth daily. Gummies     Cholecalciferol  (VITAMIN D3 PO) Take 1 tablet by mouth daily. Vit k2     diclofenac Sodium (VOLTAREN) 1 % GEL Apply 2 g topically daily as needed (pain).     Evolocumab  (REPATHA  SURECLICK) 140 MG/ML SOAJ Inject 140 mg into the skin every 14 (fourteen) days. 6 mL 3   furosemide  (LASIX ) 20 MG tablet TAKE 1 TABLET BY MOUTH DAILY ONLY AS NEEDED FOR EDEMA 90 tablet 1   hydrOXYzine  (ATARAX /VISTARIL ) 25 MG tablet Take 25 mg by mouth  daily as needed for itching.     ibuprofen  (ADVIL ) 800 MG tablet Take 800 mg by mouth 3 (three) times daily as needed.     levothyroxine  (SYNTHROID , LEVOTHROID) 75 MCG tablet Take 75 mcg by mouth daily before breakfast.      LINZESS 145 MCG CAPS capsule Take 145 mcg by mouth daily as needed (Constipation).     losartan-hydrochlorothiazide  (HYZAAR) 50-12.5 MG tablet Take 1 tablet by mouth daily.     Multiple Vitamin (MULTIVITAMIN WITH MINERALS) TABS Take 1 tablet by mouth daily.     nitroGLYCERIN  (NITROSTAT ) 0.4 MG SL tablet TAKE 1 TABLET BY MOUTH EVERY 5 MINUTES AS NEEDED FOR CHEST PAIN  25 tablet 5   pregabalin  (LYRICA ) 100 MG capsule Take 100 mg by mouth as needed (for pain).     pregabalin  (LYRICA ) 75 MG capsule Take 75 mg by mouth 3 (three) times daily as needed (Nerve pain).     traMADol  (ULTRAM ) 50 MG tablet Take 1 tablet (50 mg total) by mouth 2 (two) times daily as needed for moderate pain. 60 tablet 5   doxycycline  (VIBRA -TABS) 100 MG tablet Take 100 mg by mouth 2 (two) times daily. (Patient not taking: Reported on 09/13/2023)     Facility-Administered Medications Prior to Visit  Medication Dose Route Frequency Provider Last Rate Last Admin   regadenoson  (LEXISCAN ) injection SOLN 0.4 mg  0.4 mg Intravenous Once Okey Vina GAILS, MD       technetium tetrofosmin  (TC-MYOVIEW ) injection 32.4 millicurie  32.4 millicurie Intravenous Once PRN Okey Vina GAILS, MD        Past Medical History:  Diagnosis Date   A-fib [facility])    Anemia    CAD (coronary artery disease) 02/26/2023   Moderate Nonobstructive; LHC 04/16/18: oRCA 40-50, apical LAD 70, patent LCx, EF > 50 >> Med Rx // CCTA 02/07/21: CAC score 4.96, 69th percentile; dist to apical LAD 50-69 (small 1.3 cm in this location), pD1 0-24, LCx patent, oRCA 0-24  //  Lexiscan  Myoview  03/05/2023: EF 65, normal perfusion, low risk   Cancer (HCC)    Fibromyalgia    Hypertension    Hypothyroidism    Osteoarthritis    SVD (spontaneous vaginal delivery)    x 5      Objective:     BP 136/80 (BP Location: Left Arm, Patient Position: Sitting, Cuff Size: Large)   Pulse 70   Temp 99.1 F (37.3 C) (Oral)   Ht 5' 7 (1.702 m)   Wt 202 lb (91.6 kg)   SpO2 98%   BMI 31.64 kg/m   SpO2: 98 % RA  Amb pleasant mod obese (by BMI) bf nad    HEENT : Oropharynx  clear         NECK :  without  apparent JVD/ palpable Nodes/TM    LUNGS: no acc muscle use,  Nl contour chest which is clear to A and P bilaterally with  cough on insp   maneuvers   CV:  RRR  no s3 or murmur or increase in P2, and no edema   ABD:  soft and nontender    MS:  Gait nl   ext warm without deformities Or obvious joint restrictions  calf tenderness, cyanosis or clubbing    SKIN: warm and dry without lesions    NEURO:  alert, approp, nl sensorium with  no motor or cerebellar deficits apparent.     CXR PA and Lateral:   09/13/2023 :    I personally reviewed  images and impression is as follows:     No acute findings     Assessment   Upper airway cough syndrome Onset late Nov 2024 worse with voice use assoc with persistent gen chest burning  - cyclical cough rx 09/13/2023 with tramadol  since already using it for chronic pain at less than allowable frequency as directed by pain clinic   Upper airway cough syndrome (previously labeled PNDS),  is so named because it's frequently impossible to sort out how much is  CR/sinusitis with freq throat clearing (which can be related to primary GERD)   vs  causing  secondary ( extra esophageal)  GERD from wide swings in gastric pressure that occur with throat clearing, often  promoting self use of mint and menthol lozenges that reduce the lower esophageal sphincter tone and exacerbate the problem further in a cyclical fashion.   These are the same pts (now being labeled as having irritable larynx syndrome by some cough centers) who not infrequently have a history of having failed to tolerate ace inhibitors,  dry powder inhalers or biphosphonates or report having atypical/extraesophageal reflux symptoms(eg LPR )  that don't respond to standard doses of PPI  and are easily confused as having aecopd or asthma flares by even experienced allergists/ pulmonologists (myself included).  Of the three most common causes of  Sub-acute / recurrent or chronic cough, only one (GERD)  can actually contribute to/ trigger  the other two (asthma and post nasal drip syndrome)  and perpetuate the cylce of cough.  While not intuitively obvious, many patients with chronic low grade reflux do not cough until there is a primary  insult that disturbs the protective epithelial barrier and exposes sensitive nerve endings.   This is typically viral but can due to PNDS and  either may apply here.    >>>  The point is that once this occurs, it is difficult to eliminate the cycle  using anything but a maximally effective acid suppression regimen at least in the short run, accompanied by an appropriate diet to address non acid GERD and control / eliminate the cough itself for at least 3 days with tramadol  increased to 1 q4 (she avg twice daily baseline) >>> also  added 6 day taper off  Prednisone  starting at 40 mg per day in case of component of Th-2 driven upper or lower airways inflammation (if cough responds short term only to relapse before return while will on full rx for uacs (as above), then  that would point to allergic rhinitis/ asthma or eos bronchitis as alternative dx)    F/u in 2 weeks if not 100 % improved         Each maintenance medication was reviewed in detail including emphasizing most importantly the difference between maintenance and prns and under what circumstances the prns are to be triggered using an action plan format where appropriate.  Total time for H and P, chart review, counseling, reviewing hfa device(s) and generating customized AVS unique to this office visit / same day charting = 45 min new pt eval            Ozell America, MD 09/13/2023

## 2023-09-13 NOTE — Patient Instructions (Addendum)
 The key to effective treatment for your cough is eliminating the non-stop cycle of cough you're stuck in long enough to let your airway heal completely and then see if there is anything still making you cough once you stop the cough suppression, but this should take no more than 5 days to figure out  First take delsym (OTC)  two tsp every 12 hours and supplement if needed with  tramadol  50 mg up to 1 every 4 hours to suppress the urge to cough at all or even clear your throat. Swallowing water or using ice chips/non mint and menthol containing candies (such as lifesavers or sugarless jolly ranchers) are also effective.  You should rest your voice and avoid activities that you know make you cough.  Once you have eliminated the cough for 3 straight days try reducing the tramadol  first,  then the delsym as tolerated.    Prednisone  10 mg take  4 each am x 2 days,   2 each am x 2 days,  1 each am x 2 days and stop (this is to eliminate allergies and inflammation from coughing)  Protonix  (pantoprazole ) Take 30-60 min before first meal of the day and Pepcid  20 mg one bedtime plus chlorpheniramine 4 mg (allergy relief x 2 at bedtime (both available over the counter)  until cough is completely gone for at least a week without the need for cough suppression  GERD (REFLUX)  is an extremely common cause of respiratory symptoms, many times with no significant heartburn at all.    It can be treated with medication, but also with lifestyle changes including avoidance of late meals, excessive alcohol , smoking cessation, and avoid fatty foods, chocolate, peppermint, colas, red wine, and acidic juices such as orange juice.  NO MINT OR MENTHOL PRODUCTS SO NO COUGH DROPS  - LUDENs is ok  USE HARD CANDY INSTEAD (jolley ranchers or Stover's or Lifesavers (all available in sugarless versions) NO OIL BASED VITAMINS - use powdered substitutes.    If not 100% better in 2 weeks please call for follow

## 2023-09-13 NOTE — Assessment & Plan Note (Addendum)
 Onset late Nov 2024 worse with voice use assoc with persistent gen chest burning  - cyclical cough rx 09/13/2023 with tramadol  since already using it for chronic pain at less than allowable frequency as directed by pain clinic   Upper airway cough syndrome (previously labeled PNDS),  is so named because it's frequently impossible to sort out how much is  CR/sinusitis with freq throat clearing (which can be related to primary GERD)   vs  causing  secondary ( extra esophageal)  GERD from wide swings in gastric pressure that occur with throat clearing, often  promoting self use of mint and menthol lozenges that reduce the lower esophageal sphincter tone and exacerbate the problem further in a cyclical fashion.   These are the same pts (now being labeled as having irritable larynx syndrome by some cough centers) who not infrequently have a history of having failed to tolerate ace inhibitors,  dry powder inhalers or biphosphonates or report having atypical/extraesophageal reflux symptoms(eg LPR )  that don't respond to standard doses of PPI  and are easily confused as having aecopd or asthma flares by even experienced allergists/ pulmonologists (myself included).  Of the three most common causes of  Sub-acute / recurrent or chronic cough, only one (GERD)  can actually contribute to/ trigger  the other two (asthma and post nasal drip syndrome)  and perpetuate the cylce of cough.  While not intuitively obvious, many patients with chronic low grade reflux do not cough until there is a primary insult that disturbs the protective epithelial barrier and exposes sensitive nerve endings.   This is typically viral but can due to PNDS and  either may apply here.    >>>  The point is that once this occurs, it is difficult to eliminate the cycle  using anything but a maximally effective acid suppression regimen at least in the short run, accompanied by an appropriate diet to address non acid GERD and control / eliminate  the cough itself for at least 3 days with tramadol  increased to 1 q4 (she avg twice daily baseline) >>> also  added 6 day taper off  Prednisone  starting at 40 mg per day in case of component of Th-2 driven upper or lower airways inflammation (if cough responds short term only to relapse before return while will on full rx for uacs (as above), then  that would point to allergic rhinitis/ asthma or eos bronchitis as alternative dx)    F/u in 2 weeks if not 100 % improved         Each maintenance medication was reviewed in detail including emphasizing most importantly the difference between maintenance and prns and under what circumstances the prns are to be triggered using an action plan format where appropriate.  Total time for H and P, chart review, counseling, reviewing hfa device(s) and generating customized AVS unique to this office visit / same day charting = 45 min new pt eval

## 2023-09-14 ENCOUNTER — Encounter: Payer: Self-pay | Admitting: Podiatry

## 2023-09-14 ENCOUNTER — Ambulatory Visit (INDEPENDENT_AMBULATORY_CARE_PROVIDER_SITE_OTHER): Payer: BC Managed Care – PPO | Admitting: Podiatry

## 2023-09-14 DIAGNOSIS — M19071 Primary osteoarthritis, right ankle and foot: Secondary | ICD-10-CM

## 2023-09-14 DIAGNOSIS — M19079 Primary osteoarthritis, unspecified ankle and foot: Secondary | ICD-10-CM

## 2023-09-14 NOTE — Progress Notes (Signed)
 Subjective:  Patient ID: Whitney Daniel, female    DOB: 1956-06-08,  MRN: [id number]  Chief Complaint  Patient presents with   Routine Post Op    Patient states that she still have some pain , the injections did help for a while patient states.     68 y.o. female presents with the above complaint.  Patient presents for follow-up of right talonavicular joint pain.  She states PRP helped some but did not get rid of her pain is much as she thought.  She will think about doing another PRP injection.  Denies any other acute complaints   Review of Systems: Negative except as noted in the HPI. Denies N/V/F/Ch.  Past Medical History:  Diagnosis Date   A-fib (HCC)    Anemia    CAD (coronary artery disease) 02/26/2023   Moderate Nonobstructive; LHC 04/16/18: oRCA 40-50, apical LAD 70, patent LCx, EF > 50 >> Med Rx // CCTA 02/07/21: CAC score 4.96, 69th percentile; dist to apical LAD 50-69 (small 1.3 cm in this location), pD1 0-24, LCx patent, oRCA 0-24  //  Lexiscan  Myoview  03/05/2023: EF 65, normal perfusion, low risk   Cancer (HCC)    Fibromyalgia    Hypertension    Hypothyroidism    Osteoarthritis    SVD (spontaneous vaginal delivery)    x 5    Current Outpatient Medications:    albuterol  (VENTOLIN  HFA) 108 (90 Base) MCG/ACT inhaler, Inhale 2 puffs into the lungs every 4 (four) hours as needed for wheezing or shortness of breath., Disp: , Rfl:    amLODipine  (NORVASC ) 5 MG tablet, TAKE 1 TABLET BY MOUTH DAILY, Disp: 90 tablet, Rfl: 1   apixaban  (ELIQUIS ) 5 MG TABS tablet, TAKE 1 TABLET BY MOUTH TWICE A DAY, Disp: 180 tablet, Rfl: 1   APPLE CIDER VINEGAR PO, Take 1 tablet by mouth daily. Gummies, Disp: , Rfl:    Cholecalciferol  (VITAMIN D3 PO), Take 1 tablet by mouth daily. Vit k2, Disp: , Rfl:    diclofenac Sodium (VOLTAREN) 1 % GEL, Apply 2 g topically daily as needed (pain)., Disp: , Rfl:    doxycycline  (VIBRA -TABS) 100 MG tablet, Take 100 mg by mouth 2 (two) times daily., Disp: , Rfl:     Evolocumab  (REPATHA  SURECLICK) 140 MG/ML SOAJ, Inject 140 mg into the skin every 14 (fourteen) days., Disp: 6 mL, Rfl: 3   famotidine  (PEPCID ) 20 MG tablet, One after supper, Disp: 30 tablet, Rfl: 11   hydrOXYzine  (ATARAX /VISTARIL ) 25 MG tablet, Take 25 mg by mouth daily as needed for itching., Disp: , Rfl:    ibuprofen  (ADVIL ) 800 MG tablet, Take 800 mg by mouth 3 (three) times daily as needed., Disp: , Rfl:    levothyroxine  (SYNTHROID , LEVOTHROID) 75 MCG tablet, Take 75 mcg by mouth daily before breakfast. , Disp: , Rfl:    LINZESS 145 MCG CAPS capsule, Take 145 mcg by mouth daily as needed (Constipation)., Disp: , Rfl:    losartan-hydrochlorothiazide  (HYZAAR) 50-12.5 MG tablet, Take 1 tablet by mouth daily., Disp: , Rfl:    Multiple Vitamin (MULTIVITAMIN WITH MINERALS) TABS, Take 1 tablet by mouth daily., Disp: , Rfl:    nitroGLYCERIN  (NITROSTAT ) 0.4 MG SL tablet, TAKE 1 TABLET BY MOUTH EVERY 5 MINUTES AS NEEDED FOR CHEST PAIN, Disp: 25 tablet, Rfl: 5   pantoprazole  (PROTONIX ) 40 MG tablet, Take 1 tablet (40 mg total) by mouth daily. Take 30-60 min before first meal of the day, Disp: 30 tablet, Rfl: 2   predniSONE  (  DELTASONE ) 10 MG tablet, Take  4 each am x 2 days,   2 each am x 2 days,  1 each am x 2 days and stop, Disp: 14 tablet, Rfl: 0   pregabalin  (LYRICA ) 100 MG capsule, Take 100 mg by mouth as needed (for pain)., Disp: , Rfl:    pregabalin  (LYRICA ) 75 MG capsule, Take 75 mg by mouth 3 (three) times daily as needed (Nerve pain)., Disp: , Rfl:    traMADol  (ULTRAM ) 50 MG tablet, Take 1 tablet (50 mg total) by mouth 2 (two) times daily as needed for moderate pain., Disp: 60 tablet, Rfl: 5   furosemide  (LASIX ) 20 MG tablet, TAKE 1 TABLET BY MOUTH DAILY ONLY AS NEEDED FOR EDEMA, Disp: 90 tablet, Rfl: 3 No current facility-administered medications for this visit.  Facility-Administered Medications Ordered in Other Visits:    regadenoson  (LEXISCAN ) injection SOLN 0.4 mg, 0.4 mg, Intravenous,  Once, Okey Vina GAILS, MD   technetium tetrofosmin  (TC-MYOVIEW ) injection 32.4 millicurie, 32.4 millicurie, Intravenous, Once PRN, Okey Vina GAILS, MD  Social History   Tobacco Use  Smoking Status Never  Smokeless Tobacco Never    Allergies  Allergen Reactions   Codeine Other (See Comments)    Patient states she did not like the way codeine made her feel  Other Reaction(s): makes her feel bad   Objective:   There were no vitals filed for this visit.  There is no height or weight on file to calculate BMI. Constitutional Well developed. Well nourished.  Vascular Dorsalis pedis pulses palpable bilaterally. Posterior tibial pulses palpable bilaterally. Capillary refill normal to all digits.  No cyanosis or clubbing noted. Pedal hair growth normal.  Neurologic Normal speech. Oriented to person, place, and time. Epicritic sensation to light touch grossly present bilaterally.  Dermatologic Nails well groomed and normal in appearance. No open wounds. No skin lesions.  Orthopedic: Negative anterior drawer test or talar tilt test noted.  Limited range of motion noted at the ankle joint no deep intra-articular ankle pain noted.  Pain with dorsiflexion of the ankle joint likely due to the impingement of the anterior ankle.  Pain at the talonavicular joint.  Clinically able to appreciate osteophytes at the TN joint   Radiographs: IMPRESSION: 1. Nonvisualization of the anterior talofibular ligament with edema about the expected location suggesting tear, likely a chronic process. 2. Edema about the spring ligament suggesting chronic ligamentous sprain. 3. Osteochondral injury about the medial aspect of the talar dome. 4. Moderate osteoarthritis of the talonavicular joint with subchondral cystic changes and marrow edema. 5. Mild subchondral edema of the cuboid suggesting mild arthritis. 6. Subcutaneous soft tissue edema about the ankle and dorsum of the foot without fluid collection or  hematoma.   Assessment:   No diagnosis found.   Plan:  Patient was evaluated and treated and all questions answered.  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  Continue using cam boot but would like for transition to Tri-Lock ankle brace.  Given the amount of the pain that she is still having she will benefit from an MRI. -MRI was reviewed with the patient in extensive detail.  At this time clinically correlating her pain most of her pain appears to be at the talonavicular joint with moderate osteoarthritis present.  The osteochondral injury is not bothering her at the ankle joint as her pain is more at the anterior dorsal foot near the ankle. -Patient will think about  PRP versus surgical fusion. -She would like some time to think over from surgical fusion -Will extend her disability indefinitely until clinical resolved    No follow-ups on file.

## 2023-09-15 ENCOUNTER — Other Ambulatory Visit: Payer: Self-pay | Admitting: Physician Assistant

## 2023-09-17 ENCOUNTER — Telehealth: Payer: Self-pay | Admitting: Podiatry

## 2023-09-17 NOTE — Telephone Encounter (Signed)
 Completed paperwork from [employer] ...  Faxed to [phone] ....   Per request from patient, also e-mailed to her @ [email or web address]>  .Marland Kitchen...  J. Abbott -- 09/17/2023

## 2023-09-19 ENCOUNTER — Ambulatory Visit: Payer: BC Managed Care – PPO | Admitting: Podiatry

## 2023-09-19 DIAGNOSIS — G894 Chronic pain syndrome: Secondary | ICD-10-CM | POA: Diagnosis not present

## 2023-09-19 DIAGNOSIS — M25571 Pain in right ankle and joints of right foot: Secondary | ICD-10-CM | POA: Diagnosis not present

## 2023-09-21 ENCOUNTER — Other Ambulatory Visit: Payer: BC Managed Care – PPO

## 2023-09-21 DIAGNOSIS — E782 Mixed hyperlipidemia: Secondary | ICD-10-CM | POA: Diagnosis not present

## 2023-09-21 LAB — LIPID PANEL
Chol/HDL Ratio: 2.3 {ratio} (ref 0.0–4.4)
Cholesterol, Total: 167 mg/dL (ref 100–199)
HDL: 73 mg/dL (ref >39)
LDL Chol Calc (NIH): 75 mg/dL (ref 0–99)
Triglycerides: 106 mg/dL (ref 0–149)
VLDL Cholesterol Cal: 19 mg/dL (ref 5–40)

## 2023-09-21 LAB — HEPATIC FUNCTION PANEL
ALT: 18 [IU]/L (ref 0–32)
AST: 13 [IU]/L (ref 0–40)
Albumin: 4.3 g/dL (ref 3.9–4.9)
Alkaline Phosphatase: 99 [IU]/L (ref 44–121)
Bilirubin Total: 0.3 mg/dL (ref 0.0–1.2)
Bilirubin, Direct: 0.1 mg/dL (ref 0.00–0.40)
Total Protein: 6.9 g/dL (ref 6.0–8.5)

## 2023-09-26 ENCOUNTER — Telehealth: Payer: Self-pay

## 2023-09-26 DIAGNOSIS — E782 Mixed hyperlipidemia: Secondary | ICD-10-CM

## 2023-09-26 NOTE — Telephone Encounter (Signed)
The patient has been notified of the result and verbalized understanding.  All questions (if any) were answered. Frutoso Schatz, RN 09/26/2023 4:26 PM  Referral has been placed.

## 2023-09-26 NOTE — Telephone Encounter (Signed)
-----   Message from Tereso Newcomer sent at 09/24/2023 12:36 PM EST ----- Result note sent to Deri Fuelling via MyChart. See comments below. PLAN:  -Refer back to [facility] for Bempedoic Acid  Ms. Brunell  Your liver enzymes (AST, ALT) are normal. Your LDL cholesterol is improved but still above goal. It should be less than 70. I will refer you to our [facility] to see if we can get you approved for the other medication that we discussed (Nexletol or Bempedoic Acid).  Tereso Newcomer, PA-C    09/24/2023 12:31 PM

## 2023-09-27 ENCOUNTER — Encounter (HOSPITAL_BASED_OUTPATIENT_CLINIC_OR_DEPARTMENT_OTHER): Payer: Self-pay | Admitting: Emergency Medicine

## 2023-09-27 ENCOUNTER — Other Ambulatory Visit: Payer: Self-pay

## 2023-09-27 ENCOUNTER — Emergency Department (HOSPITAL_BASED_OUTPATIENT_CLINIC_OR_DEPARTMENT_OTHER)
Admission: EM | Admit: 2023-09-27 | Discharge: 2023-09-27 | Disposition: A | Discharge status: Home / Self Care | Payer: BC Managed Care – PPO | Attending: Emergency Medicine | Admitting: Emergency Medicine

## 2023-09-27 DIAGNOSIS — I10 Essential (primary) hypertension: Secondary | ICD-10-CM | POA: Diagnosis not present

## 2023-09-27 DIAGNOSIS — Z7901 Long term (current) use of anticoagulants: Secondary | ICD-10-CM | POA: Insufficient documentation

## 2023-09-27 DIAGNOSIS — R319 Hematuria, unspecified: Secondary | ICD-10-CM | POA: Insufficient documentation

## 2023-09-27 DIAGNOSIS — N390 Urinary tract infection, site not specified: Secondary | ICD-10-CM | POA: Insufficient documentation

## 2023-09-27 DIAGNOSIS — R300 Dysuria: Secondary | ICD-10-CM | POA: Diagnosis not present

## 2023-09-27 LAB — URINALYSIS, ROUTINE W REFLEX MICROSCOPIC
Bilirubin Urine: NEGATIVE
Glucose, UA: NEGATIVE mg/dL
Ketones, ur: NEGATIVE mg/dL
Nitrite: POSITIVE — AB
Protein, ur: 100 mg/dL — AB
Specific Gravity, Urine: 1.025 (ref 1.005–1.030)
pH: 6.5 (ref 5.0–8.0)

## 2023-09-27 LAB — URINALYSIS, MICROSCOPIC (REFLEX): WBC, UA: >50 WBC/hpf (ref 0–5)

## 2023-09-27 MED ORDER — CEPHALEXIN 500 MG PO CAPS: 500.0000 mg | ORAL_CAPSULE | Freq: Two times a day (BID) | ORAL | 0 refills | Status: DC

## 2023-09-27 MED ORDER — CEPHALEXIN 250 MG PO CAPS
500.0000 mg | ORAL_CAPSULE | Freq: Once | ORAL | Status: AC
  Administered 2023-09-27: 500 mg via ORAL
  Filled 2023-09-27: qty 2

## 2023-09-27 NOTE — ED Provider Notes (Signed)
[place] [facility] AT [facility] Provider Note   CSN: [id number] Arrival date & time: 09/27/23  1949     History  Chief Complaint  Patient presents with   Urinary Frequency    Whitney Daniel is a 68 y.o. female presents today for urinary urgency, frequency, and dysuria x 1 day.  Patient denies hematuria, pelvic pain, flank pain, fever, chills, nausea, or vomiting.   Urinary Frequency       Home Medications Prior to Admission medications   Medication Sig Start Date End Date Taking? Authorizing Provider  cephALEXin (KEFLEX) 500 MG capsule Take 1 capsule (500 mg total) by mouth 2 (two) times daily. 09/27/23  Yes Dolphus Jenny, PA-C  albuterol (VENTOLIN HFA) 108 (90 Base) MCG/ACT inhaler Inhale 2 puffs into the lungs every 4 (four) hours as needed for wheezing or shortness of breath. 01/09/14   [provider]  amLODipine (NORVASC) 5 MG tablet TAKE 1 TABLET BY MOUTH DAILY 06/30/22   Lyn Records, MD  apixaban (ELIQUIS) 5 MG TABS tablet TAKE 1 TABLET BY MOUTH TWICE A DAY 09/04/22   Lyn Records, MD  APPLE CIDER VINEGAR PO Take 1 tablet by mouth daily. Gummies    [provider]  Cholecalciferol (VITAMIN D3 PO) Take 1 tablet by mouth daily. Vit k2    [provider]  diclofenac Sodium (VOLTAREN) 1 % GEL Apply 2 g topically daily as needed (pain). 09/13/22   [provider]  doxycycline (VIBRA-TABS) 100 MG tablet Take 100 mg by mouth 2 (two) times daily. 08/21/23   [provider]  Evolocumab (REPATHA SURECLICK) 140 MG/ML SOAJ Inject 140 mg into the skin every 14 (fourteen) days. 08/24/23   Orbie Pyo, MD  famotidine (PEPCID) 20 MG tablet One after supper 09/13/23   Nyoka Cowden, MD  furosemide (LASIX) 20 MG tablet TAKE 1 TABLET BY MOUTH DAILY ONLY AS NEEDED FOR EDEMA 09/17/23   Tereso Newcomer T, PA-C  hydrOXYzine (ATARAX/VISTARIL) 25 MG tablet Take 25 mg by mouth daily as needed for itching. 12/06/20   [provider]  ibuprofen (ADVIL) 800 MG tablet Take 800 mg by mouth 3 (three) times daily as needed.    [provider]  levothyroxine (SYNTHROID, LEVOTHROID) 75 MCG tablet Take 75 mcg by mouth daily before breakfast.     [provider]  LINZESS 145 MCG CAPS capsule Take 145 mcg by mouth daily as needed (Constipation). 04/08/20   [provider]  losartan-hydrochlorothiazide (HYZAAR) 50-12.5 MG tablet Take 1 tablet by mouth daily. 10/18/18   [provider]  Multiple Vitamin (MULTIVITAMIN WITH MINERALS) TABS Take 1 tablet by mouth daily.    [provider]  nitroGLYCERIN (NITROSTAT) 0.4 MG SL tablet TAKE 1 TABLET BY MOUTH EVERY 5 MINUTES AS NEEDED FOR CHEST PAIN 06/08/22   Lyn Records, MD  pantoprazole (PROTONIX) 40 MG tablet Take 1 tablet (40 mg total) by mouth daily. Take 30-60 min before first meal of the day 09/13/23   Nyoka Cowden, MD  predniSONE (DELTASONE) 10 MG tablet Take  4 each am x 2 days,   2 each am x 2 days,  1 each am x 2 days and stop 09/13/23   Nyoka Cowden, MD  pregabalin (LYRICA) 100 MG capsule Take 100 mg by mouth as needed (for pain). 06/06/22   [provider]  pregabalin (LYRICA) 75 MG capsule Take 75 mg by mouth 3 (three) times daily as  needed (Nerve pain).    [provider]  traMADol (ULTRAM) 50 MG tablet Take 1 tablet (50 mg total) by mouth 2 (two) times daily as needed for moderate pain. 04/26/18   Kirsteins, Victorino Sparrow, MD      Allergies    Codeine    Review of Systems   Review of Systems  Genitourinary:  Positive for dysuria, frequency and urgency.    Physical Exam Updated Vital Signs BP (!) 154/87 (BP Location: Right Arm)   Pulse 75   Temp 98.9 F (37.2 C)   Resp 18   Wt 96.2 kg   SpO2 97%   BMI 33.20 kg/m  Physical Exam Vitals and nursing note reviewed.  Constitutional:      General: She is not in acute distress.    Appearance: Normal appearance. She is well-developed. She is not  ill-appearing, toxic-appearing or diaphoretic.  HENT:     Head: Normocephalic and atraumatic.     Right Ear: External ear normal.     Left Ear: External ear normal.     Mouth/Throat:     Mouth: Mucous membranes are moist.  Eyes:     Conjunctiva/sclera: Conjunctivae normal.  Cardiovascular:     Rate and Rhythm: Normal rate and regular rhythm.     Pulses: Normal pulses.     Heart sounds: Normal heart sounds. No murmur heard. Pulmonary:     Effort: Pulmonary effort is normal. No respiratory distress.     Breath sounds: Normal breath sounds.  Abdominal:     Palpations: Abdomen is soft.     Tenderness: There is no abdominal tenderness. There is no right CVA tenderness or left CVA tenderness.  Musculoskeletal:        General: No swelling.     Cervical back: Normal range of motion and neck supple.  Skin:    General: Skin is warm and dry.     Capillary Refill: Capillary refill takes less than 2 seconds.  Neurological:     General: No focal deficit present.     Mental Status: She is alert.     Motor: No weakness.  Psychiatric:        Mood and Affect: Mood normal.     ED Results / Procedures / Treatments   Labs (all labs ordered are listed, but only abnormal results are displayed) Labs Reviewed  URINALYSIS, ROUTINE W REFLEX MICROSCOPIC - Abnormal; Notable for the following components:      Result Value   APPearance HAZY (*)    Hgb urine dipstick LARGE (*)    Protein, ur 100 (*)    Nitrite POSITIVE (*)    Leukocytes,Ua MODERATE (*)    All other components within normal limits  URINALYSIS, MICROSCOPIC (REFLEX) - Abnormal; Notable for the following components:   Bacteria, UA FEW (*)    All other components within normal limits    EKG None  Radiology No results found.  Procedures Procedures    Medications Ordered in ED Medications  cephALEXin (KEFLEX) capsule 500 mg (has no administration in time range)    ED Course/ Medical Decision Making/ A&P                                  Medical Decision Making Amount and/or Complexity of Data Reviewed Labs: ordered.   This patient presents to the ED with chief complaint(s) of dysuria with pertinent past medical history of none which further complicates  the presenting complaint. The complaint involves an extensive differential diagnosis and also carries with it a high risk of complications and morbidity.    The differential diagnosis includes UTI, STD, pyelonephritis, kidney stone  Additional history obtained: Records reviewed Care Everywhere/External Records  ED Course and Reassessment: Patient given first dose of Keflex in ED  Independent labs interpretation:  The following labs were independently interpreted:  UA: Large hemoglobin, moderate leuks, positive nitrites, 100 protein, few bacteria, 21-50 RBCs, greater than 50 WBCs   Consultation: - Consulted or discussed management/test interpretation w/ external professional: None  Consideration for admission or further workup: Consider for admission or further workup however patient's vital signs, physical exam, and labs were reassuring.  Patient symptoms likely due to urinary tract infection.  Patient will be treated outpatient with 7 days of Keflex.  Patient should follow-up with primary care physician for further evaluation if symptoms persist.         Final Clinical Impression(s) / ED Diagnoses Final diagnoses:  Urinary tract infection with hematuria, site unspecified    Rx / DC Orders ED Discharge Orders          Ordered    cephALEXin (KEFLEX) 500 MG capsule  2 times daily        09/27/23 2146              Dolphus Jenny, PA-C 09/27/23 2149    Laurence Spates, MD 09/28/23 [phone]

## 2023-09-27 NOTE — ED Triage Notes (Signed)
Patient reports she thinks she has a UTI.  Patient reports urinary urgency and frequency.  Patient also endorses pain with urination.

## 2023-09-27 NOTE — ED Notes (Signed)
 Pt d/c home per EDP order. Discharge summary reviewed, verbalize understanding. NAD. Ambulatory off unit.

## 2023-09-27 NOTE — Discharge Instructions (Signed)
Today you were seen for a UTI.  Please pick up your antibiotic and take as prescribed.  Please follow-up with your primary care physician if your symptoms persist for further evaluation and treatment.  Thank you for letting us treat you today. After performing a physical exam and reviewing your labs, I feel you are safe to go home. Please follow up with your PCP in the next several days and provide them with your records from this visit. Return to the Emergency Room if pain becomes severe or symptoms worsen.

## 2023-09-28 ENCOUNTER — Other Ambulatory Visit: Payer: Self-pay

## 2023-09-28 DIAGNOSIS — I25118 Atherosclerotic heart disease of native coronary artery with other forms of angina pectoris: Secondary | ICD-10-CM

## 2023-09-28 MED ORDER — AMLODIPINE BESYLATE 5 MG PO TABS: 5.0000 mg | ORAL_TABLET | Freq: Every day | ORAL | 2 refills | Status: AC

## 2023-10-11 DIAGNOSIS — M810 Age-related osteoporosis without current pathological fracture: Secondary | ICD-10-CM | POA: Diagnosis not present

## 2023-10-11 DIAGNOSIS — Z780 Asymptomatic menopausal state: Secondary | ICD-10-CM | POA: Diagnosis not present

## 2023-10-11 DIAGNOSIS — Z808 Family history of malignant neoplasm of other organs or systems: Secondary | ICD-10-CM | POA: Diagnosis not present

## 2023-10-11 DIAGNOSIS — Z01419 Encounter for gynecological examination (general) (routine) without abnormal findings: Secondary | ICD-10-CM | POA: Diagnosis not present

## 2023-10-12 DIAGNOSIS — Z131 Encounter for screening for diabetes mellitus: Secondary | ICD-10-CM | POA: Diagnosis not present

## 2023-10-12 DIAGNOSIS — Z130 Encounter for screening for diseases of the blood and blood-forming organs and certain disorders involving the immune mechanism: Secondary | ICD-10-CM | POA: Diagnosis not present

## 2023-10-12 DIAGNOSIS — Z0000 Encounter for general adult medical examination without abnormal findings: Secondary | ICD-10-CM | POA: Diagnosis not present

## 2023-10-12 DIAGNOSIS — Z13220 Encounter for screening for lipoid disorders: Secondary | ICD-10-CM | POA: Diagnosis not present

## 2023-10-24 DIAGNOSIS — G894 Chronic pain syndrome: Secondary | ICD-10-CM | POA: Diagnosis not present

## 2023-10-24 DIAGNOSIS — M25571 Pain in right ankle and joints of right foot: Secondary | ICD-10-CM | POA: Diagnosis not present

## 2023-11-09 DIAGNOSIS — Z1231 Encounter for screening mammogram for malignant neoplasm of breast: Secondary | ICD-10-CM | POA: Diagnosis not present

## 2023-11-09 LAB — HM MAMMOGRAPHY

## 2023-11-12 ENCOUNTER — Encounter: Payer: Self-pay | Admitting: Pharmacist

## 2023-11-12 ENCOUNTER — Ambulatory Visit: Payer: BC Managed Care – PPO | Attending: Cardiovascular Disease | Admitting: Pharmacist

## 2023-11-12 DIAGNOSIS — E782 Mixed hyperlipidemia: Secondary | ICD-10-CM

## 2023-11-12 MED ORDER — NITROGLYCERIN 0.4 MG SL SUBL: SUBLINGUAL_TABLET | SUBLINGUAL | 5 refills | Status: AC

## 2023-11-12 MED ORDER — EZETIMIBE 10 MG PO TABS: 10.0000 mg | ORAL_TABLET | Freq: Every day | ORAL | 3 refills | Status: AC

## 2023-11-12 NOTE — Progress Notes (Signed)
 Patient ID: Whitney Daniel                 DOB: 02-14-1956                    MRN: [id number]      HPI: Whitney Daniel is a 68 y.o. female patient referred to lipid clinic by Tereso Newcomer, PA. PMH is significant for ht, PAF, CAD, hypothyroidism,HLD.  Cath 2019 showed 40-50% oRCA stenosis and 70% LAD after the third diagonal branch. Coronary CTA 02/2021 showed calcium score of 4.96 which was the 69th percentile and age, sex, and race matched controls; also showed moderate (50-69%) distal to apical LAD stenosis. Last seen by Dr. Katrinka Blazing in 01/2021. Patient called in 08/2021 complaining of muscle and bone pain and having trouble walking in the morning. CPK was elevated to 420 and atorvastatin was discontinued.  Patient saw PharmD 2023 for lipid medication optimization. Repatha was started as patient was intolerant to statin due to CK elevation. Patient has been adherent to Repatha. LDLc stays slightly above the goal so patient was referred back to lipid clinic again. We discussed ezetimibe and bempedoic acid, Discussed mechanisms of action, dosing, side effects and potential decreases in LDL cholesterol.   Current Medications: Repatha 140 mg every 14 days  Intolerances: atorvastatin - elevated CK Risk Factors: CAD, Fhx ASCVD, HTN, elevated lipoprotein(a) LDL goal: <70 mg/dL  Diet: eats sweets - loves cake  Does not eat out a lot, mainly eats home cooked healthy meals   Exercise:  Gym - 3-4 times per week weight bearing exercise and walk   Family History:  Relation Problem Comments  Mother (Deceased) CVA   Hypertension     Father Metallurgist) Cancer   Diabetes     Sister Metallurgist) Stroke and 2 MI at age of 71  Sister (Alive)   Sister Metallurgist)   Sister (Deceased)   Sister Diabetes     Brother Deceased   Sister Heart attack in her late 55's        Labs: 02/01/2022: TC 148, TG 67, HDL 62, LDL 73 (Repatha 140 mg ) 11/14/2021: TC 213, LDL 137 TG 74 HDL 63  08/12/21: CK 420 (atorvastatin 80  mg) 01/19/21: TC 119, TG 49, HDL 56, LDL 51 (atorvastatin 80 mg) 04/16/18: TC 206, TG 81, HDL 46, LDL 144 (no lipid lowering therapy)  Lipid Panel     Component Value Date/Time   CHOL 167 09/21/2023 0832   TRIG 106 09/21/2023 0832   HDL 73 09/21/2023 0832   CHOLHDL 2.3 09/21/2023 0832   CHOLHDL 4.5 04/16/2018 0605   VLDL 16 04/16/2018 0605   LDLCALC 75 09/21/2023 0832   LABVLDL 19 09/21/2023 0832    Past Medical History:  Diagnosis Date   A-fib (HCC)    Anemia    CAD (coronary artery disease) 02/26/2023   Moderate Nonobstructive; LHC 04/16/18: oRCA 40-50, apical LAD 70, patent LCx, EF > 50 >> Med Rx // CCTA 02/07/21: CAC score 4.96, 69th percentile; dist to apical LAD 50-69 (small 1.3 cm in this location), pD1 0-24, LCx patent, oRCA 0-24  //  Lexiscan Myoview 03/05/2023: EF 65, normal perfusion, low risk   Cancer (HCC)    Fibromyalgia    Hypertension    Hypothyroidism    Osteoarthritis    SVD (spontaneous vaginal delivery)    x 5    Current Outpatient Medications on File Prior to Visit  Medication Sig Dispense Refill   albuterol (VENTOLIN  HFA) 108 (90 Base) MCG/ACT inhaler Inhale 2 puffs into the lungs every 4 (four) hours as needed for wheezing or shortness of breath.     amLODipine (NORVASC) 5 MG tablet Take 1 tablet (5 mg total) by mouth daily. 90 tablet 2   apixaban (ELIQUIS) 5 MG TABS tablet TAKE 1 TABLET BY MOUTH TWICE A DAY 180 tablet 1   APPLE CIDER VINEGAR PO Take 1 tablet by mouth daily. Gummies     Cholecalciferol (VITAMIN D3 PO) Take 1 tablet by mouth daily. Vit k2     diclofenac Sodium (VOLTAREN) 1 % GEL Apply 2 g topically daily as needed (pain).     Evolocumab (REPATHA SURECLICK) 140 MG/ML SOAJ Inject 140 mg into the skin every 14 (fourteen) days. 6 mL 3   famotidine (PEPCID) 20 MG tablet One after supper 30 tablet 11   furosemide (LASIX) 20 MG tablet TAKE 1 TABLET BY MOUTH DAILY ONLY AS NEEDED FOR EDEMA 90 tablet 3   hydrOXYzine (ATARAX/VISTARIL) 25 MG tablet Take  25 mg by mouth daily as needed for itching.     ibuprofen (ADVIL) 800 MG tablet Take 800 mg by mouth 3 (three) times daily as needed.     levothyroxine (SYNTHROID, LEVOTHROID) 75 MCG tablet Take 75 mcg by mouth daily before breakfast.      LINZESS 145 MCG CAPS capsule Take 145 mcg by mouth daily as needed (Constipation).     losartan-hydrochlorothiazide (HYZAAR) 50-12.5 MG tablet Take 1 tablet by mouth daily.     Multiple Vitamin (MULTIVITAMIN WITH MINERALS) TABS Take 1 tablet by mouth daily.     pantoprazole (PROTONIX) 40 MG tablet Take 1 tablet (40 mg total) by mouth daily. Take 30-60 min before first meal of the day 30 tablet 2   pregabalin (LYRICA) 100 MG capsule Take 100 mg by mouth as needed (for pain).     pregabalin (LYRICA) 75 MG capsule Take 75 mg by mouth 3 (three) times daily as needed (Nerve pain).     traMADol (ULTRAM) 50 MG tablet Take 1 tablet (50 mg total) by mouth 2 (two) times daily as needed for moderate pain. 60 tablet 5   doxycycline (VIBRA-TABS) 100 MG tablet Take 100 mg by mouth 2 (two) times daily.     predniSONE (DELTASONE) 10 MG tablet Take  4 each am x 2 days,   2 each am x 2 days,  1 each am x 2 days and stop 14 tablet 0   Current Facility-Administered Medications on File Prior to Visit  Medication Dose Route Frequency Provider Last Rate Last Admin   regadenoson (LEXISCAN) injection SOLN 0.4 mg  0.4 mg Intravenous Once Pricilla Riffle, MD       technetium tetrofosmin (TC-MYOVIEW) injection 32.4 millicurie  32.4 millicurie Intravenous Once PRN Pricilla Riffle, MD        Allergies  Allergen Reactions   Codeine Other (See Comments)    Patient states "she did not like the way codeine made her feel"  Other Reaction(s): makes her feel bad    Assessment/Plan:  1. Hyperlipidemia -  Problem  Hyperlipidemia   Hyperlipidemia Assessment:  LDL goal: <70 mg/dl last LDLc 75 mg/dl (04/6577)IONGE on Repatha  Intolerance to statin- CK elevation  Currently in Repatha 140 mg  Q14d tolerates it well  Discussed next potential options (ezetimibe,  bempedoic acid ); cost, dosing efficacy, side effects  Exercise 3-4 days per week but diet needs improvement   Plan: Continue taking current medications -  Repatha 140 mg every 14 days   Start taking ezetimibe 10 mg daily  Will repeat lab in 8-12 weeks    Thank you,  Carmela Hurt, Pharm.D [facility] of [facility] [address] [place], [place] [address]  Phone: [phone]; Fax: [phone]

## 2023-11-12 NOTE — Patient Instructions (Addendum)
 Your Results:             Your most recent labs Goal  Total Cholesterol 167 < 200  Triglycerides 106 < 150  HDL (happy/good cholesterol) 73 > 40  LDL (lousy/bad cholesterol 75 < 70   Medication changes: Continue taking Repatha and start taking ezetimibe 10 mg daily    Lab orders: We want to repeat labs after 2-3 months.  We will send you a lab order to remind you once we get closer to that time.

## 2023-11-12 NOTE — Assessment & Plan Note (Signed)
 Assessment:  LDL goal: <70 mg/dl last LDLc 75 mg/dl (16/1096)EAVWU on Repatha  Intolerance to statin- CK elevation  Currently in Repatha 140 mg Q14d tolerates it well  Discussed next potential options (ezetimibe,  bempedoic acid ); cost, dosing efficacy, side effects  Exercise 3-4 days per week but diet needs improvement   Plan: Continue taking current medications - Repatha 140 mg every 14 days   Start taking ezetimibe 10 mg daily  Will repeat lab in 8-12 weeks

## 2023-11-21 DIAGNOSIS — G894 Chronic pain syndrome: Secondary | ICD-10-CM | POA: Diagnosis not present

## 2023-11-21 DIAGNOSIS — M25571 Pain in right ankle and joints of right foot: Secondary | ICD-10-CM | POA: Diagnosis not present

## 2023-11-21 DIAGNOSIS — M25562 Pain in left knee: Secondary | ICD-10-CM | POA: Diagnosis not present

## 2023-11-21 DIAGNOSIS — Z79891 Long term (current) use of opiate analgesic: Secondary | ICD-10-CM | POA: Diagnosis not present

## 2023-11-22 ENCOUNTER — Other Ambulatory Visit: Payer: Self-pay

## 2023-11-22 DIAGNOSIS — I480 Paroxysmal atrial fibrillation: Secondary | ICD-10-CM

## 2023-11-22 MED ORDER — APIXABAN 5 MG PO TABS: 5.0000 mg | ORAL_TABLET | Freq: Two times a day (BID) | ORAL | 1 refills | Status: DC

## 2023-11-22 NOTE — Telephone Encounter (Signed)
 Prescription refill request for Eliquis received. Indication: Afib  Last office visit: 06/27/23 Alben Spittle)  Scr: 0.88 (08/16/23)  Age: 68 Weight: 96.2kg  Appropriate dose. Refill sent.

## 2023-11-27 ENCOUNTER — Telehealth: Payer: Self-pay | Admitting: Podiatry

## 2023-11-27 DIAGNOSIS — Z0271 Encounter for disability determination: Secondary | ICD-10-CM

## 2023-11-27 NOTE — Telephone Encounter (Signed)
 Patient left mess about extending her RTW date from 12/18/23 to 01/21/24. I called her back at [phone] and advised I would revise the date and resend to [place] her RTW date is now 01/20/22. I faxed [phone] updated date

## 2023-11-27 NOTE — Telephone Encounter (Signed)
 Faxed [facility] physical stmt/notes to [phone]

## 2023-11-27 NOTE — Telephone Encounter (Signed)
 See prev notes

## 2023-11-28 ENCOUNTER — Telehealth: Payer: Self-pay | Admitting: Pharmacist

## 2023-11-28 ENCOUNTER — Other Ambulatory Visit (HOSPITAL_COMMUNITY): Payer: Self-pay

## 2023-11-28 ENCOUNTER — Telehealth: Payer: Self-pay | Admitting: Pharmacy Technician

## 2023-11-28 NOTE — Telephone Encounter (Signed)
 Ran test claim for xarelto. For a 30 day supply and the co-pay is 47.00 . PA is not needed at this time. Nothing saying this is a transition fill. This test claim was processed through [facility]- copay amounts may vary at other pharmacies due to pharmacy/plan contracts, or as the patient moves through the different stages of their insurance plan.     I tried pradaxa and it said: ELIQUIS;XARELTO TABS PREF'D   Tylene Fantasia, [facility]   Can you assess coverage for Xarelto and Pradaxa.Eliquis is cost prohibitive. Hse said she has medicare from health team advantage and commercial insurance through [employer]

## 2023-11-29 NOTE — Telephone Encounter (Signed)
 Spoke to patient, she was able to use co-pay card with her commercial insurance and she got 90 days supply filled for $30 yesterday.   Patient was appreciative.

## 2023-12-08 ENCOUNTER — Other Ambulatory Visit: Payer: Self-pay | Admitting: Internal Medicine

## 2023-12-08 DIAGNOSIS — R058 Other specified cough: Secondary | ICD-10-CM

## 2023-12-20 ENCOUNTER — Ambulatory Visit (INDEPENDENT_AMBULATORY_CARE_PROVIDER_SITE_OTHER): Admitting: Podiatry

## 2023-12-20 DIAGNOSIS — M19071 Primary osteoarthritis, right ankle and foot: Secondary | ICD-10-CM | POA: Diagnosis not present

## 2023-12-20 DIAGNOSIS — M19079 Primary osteoarthritis, unspecified ankle and foot: Secondary | ICD-10-CM

## 2023-12-20 NOTE — Progress Notes (Signed)
 Subjective:  Patient ID: Whitney Daniel, female    DOB: [birth year],  MRN: [id number]  Chief Complaint  Patient presents with   Arthritis    Pt stated that she is here to get an injection in her ankle     68 y.o. female presents with the above complaint.  Patient presents for follow-up of right talonavicular joint pain.  She states she would like to do an injection as that has helped a lot.  Denies any other acute complaints  Review of Systems: Negative except as noted in the HPI. Denies N/V/F/Ch.  Past Medical History:  Diagnosis Date   A-fib (HCC)    Anemia    CAD (coronary artery disease) 02/26/2023   Moderate Nonobstructive; LHC 04/16/18: oRCA 40-50, apical LAD 70, patent LCx, EF > 50 >> Med Rx // CCTA 02/07/21: CAC score 4.96, 69th percentile; dist to apical LAD 50-69 (small 1.3 cm in this location), pD1 0-24, LCx patent, oRCA 0-24  //  Lexiscan Myoview 03/05/2023: EF 65, normal perfusion, low risk   Cancer (HCC)    Fibromyalgia    Hypertension    Hypothyroidism    Osteoarthritis    SVD (spontaneous vaginal delivery)    x 5    Current Outpatient Medications:    albuterol (VENTOLIN HFA) 108 (90 Base) MCG/ACT inhaler, Inhale 2 puffs into the lungs every 4 (four) hours as needed for wheezing or shortness of breath., Disp: , Rfl:    amLODipine (NORVASC) 5 MG tablet, Take 1 tablet (5 mg total) by mouth daily., Disp: 90 tablet, Rfl: 2   apixaban (ELIQUIS) 5 MG TABS tablet, Take 1 tablet (5 mg total) by mouth 2 (two) times daily., Disp: 180 tablet, Rfl: 1   APPLE CIDER VINEGAR PO, Take 1 tablet by mouth daily. Gummies, Disp: , Rfl:    Cholecalciferol (VITAMIN D3 PO), Take 1 tablet by mouth daily. Vit k2, Disp: , Rfl:    diclofenac Sodium (VOLTAREN) 1 % GEL, Apply 2 g topically daily as needed (pain)., Disp: , Rfl:    Evolocumab (REPATHA SURECLICK) 140 MG/ML SOAJ, Inject 140 mg into the skin every 14 (fourteen) days., Disp: 6 mL, Rfl: 3   ezetimibe (ZETIA) 10 MG tablet, Take 1 tablet (10  mg total) by mouth daily., Disp: 90 tablet, Rfl: 3   famotidine (PEPCID) 20 MG tablet, One after supper, Disp: 30 tablet, Rfl: 11   furosemide (LASIX) 20 MG tablet, TAKE 1 TABLET BY MOUTH DAILY ONLY AS NEEDED FOR EDEMA, Disp: 90 tablet, Rfl: 3   hydrOXYzine (ATARAX/VISTARIL) 25 MG tablet, Take 25 mg by mouth daily as needed for itching., Disp: , Rfl:    levothyroxine (SYNTHROID, LEVOTHROID) 75 MCG tablet, Take 75 mcg by mouth daily before breakfast. , Disp: , Rfl:    LINZESS 145 MCG CAPS capsule, Take 145 mcg by mouth daily as needed (Constipation)., Disp: , Rfl:    losartan-hydrochlorothiazide (HYZAAR) 50-12.5 MG tablet, Take 1 tablet by mouth daily., Disp: , Rfl:    Multiple Vitamin (MULTIVITAMIN WITH MINERALS) TABS, Take 1 tablet by mouth daily., Disp: , Rfl:    nitroGLYCERIN (NITROSTAT) 0.4 MG SL tablet, TAKE 1 TABLET BY MOUTH EVERY 5 MINUTES AS NEEDED FOR CHEST PAIN, Disp: 25 tablet, Rfl: 5   pantoprazole (PROTONIX) 40 MG tablet, TAKE 1 TABLET (40 MG TOTAL) BY MOUTH DAILY. TAKE 30-60 MIN BEFORE FIRST MEAL OF THE DAY, Disp: 90 tablet, Rfl: 1   pregabalin (LYRICA) 100 MG capsule, Take 100 mg by mouth as needed (  for pain)., Disp: , Rfl:    pregabalin (LYRICA) 75 MG capsule, Take 75 mg by mouth 3 (three) times daily as needed (Nerve pain)., Disp: , Rfl:    traMADol (ULTRAM) 50 MG tablet, Take 1 tablet (50 mg total) by mouth 2 (two) times daily as needed for moderate pain., Disp: 60 tablet, Rfl: 5 No current facility-administered medications for this visit.  Facility-Administered Medications Ordered in Other Visits:    regadenoson (LEXISCAN) injection SOLN 0.4 mg, 0.4 mg, Intravenous, Once, Pricilla Riffle, MD   technetium tetrofosmin (TC-MYOVIEW) injection 32.4 millicurie, 32.4 millicurie, Intravenous, Once PRN, Pricilla Riffle, MD  Social History   Tobacco Use  Smoking Status Never  Smokeless Tobacco Never    Allergies  Allergen Reactions   Codeine Other (See Comments)    Patient states  "she did not like the way codeine made her feel"  Other Reaction(s): makes her feel bad   Objective:   There were no vitals filed for this visit.  There is no height or weight on file to calculate BMI. Constitutional Well developed. Well nourished.  Vascular Dorsalis pedis pulses palpable bilaterally. Posterior tibial pulses palpable bilaterally. Capillary refill normal to all digits.  No cyanosis or clubbing noted. Pedal hair growth normal.  Neurologic Normal speech. Oriented to person, place, and time. Epicritic sensation to light touch grossly present bilaterally.  Dermatologic Nails well groomed and normal in appearance. No open wounds. No skin lesions.  Orthopedic: Negative anterior drawer test or talar tilt test noted.  Limited range of motion noted at the ankle joint no deep intra-articular ankle pain noted.  Pain with dorsiflexion of the ankle joint likely due to the impingement of the anterior ankle.  Pain at the talonavicular joint.  Clinically able to appreciate osteophytes at the TN joint   Radiographs: IMPRESSION: 1. Nonvisualization of the anterior talofibular ligament with edema about the expected location suggesting tear, likely a chronic process. 2. Edema about the spring ligament suggesting chronic ligamentous sprain. 3. Osteochondral injury about the medial aspect of the talar dome. 4. Moderate osteoarthritis of the talonavicular joint with subchondral cystic changes and marrow edema. 5. Mild subchondral edema of the cuboid suggesting mild arthritis. 6. Subcutaneous soft tissue edema about the ankle and dorsum of the foot without fluid collection or hematoma.   Assessment:   No diagnosis found.   Plan:  Patient was evaluated and treated and all questions answered.  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  Continue using cam boot but would like for  transition to Tri-Lock ankle brace. -MRI was reviewed with the patient in extensive detail.  At this time clinically correlating her pain most of her pain appears to be at the talonavicular joint with moderate osteoarthritis present.  The osteochondral injury is not bothering her at the ankle joint as her pain is more at the anterior dorsal foot near the ankle. -A steroid injection was performed at right talonavicular joint using 1% plain Lidocaine and 10 mg of Kenalog. This was well tolerated. -Patient will think about PRP versus surgical fusion. -She would like some time to think over from surgical fusion -Will extend her disability indefinitely until clinical resolved    No follow-ups on file.

## 2023-12-22 NOTE — Progress Notes (Signed)
 Cardiology Office Note:   Date:  12/24/2023  ID:  Whitney Daniel, Whitney Daniel 1956-05-17, MRN [id number] PCP:  Ronna Coho, MD  [facility] Providers Cardiologist:  Alyssa Backbone, MD Referring MD: Ronna Coho, MD  Chief Complaint/Reason for Referral: Follow-up for coronary artery disease ASSESSMENT:    1. Coronary artery disease involving native coronary artery of native heart without angina pectoris   2. Hyperlipidemia LDL goal <70   3. Aortic atherosclerosis (HCC)   4. Essential hypertension   5. PAF (paroxysmal atrial fibrillation) (HCC)   6. Secondary hypercoagulable state (HCC)     PLAN:   In order of problems listed above: CAD: Continue Eliquis  5 mg twice daily, Repatha  140 mg every 2 weeks, Zetia  10 mg daily, as needed nitroglycerin . Hyperlipidemia: Continue Repatha  140 mg every 2 weeks and Zetia  10 mg daily.  Check lipid panel and LP(a) today. Aortic atherosclerosis: Continue Eliquis  5 mg twice daily, Repatha  140 mg every 2 weeks, and Zetia  10 mg daily. Hypertension: Continue amlodipine  5 mg daily and losartan/hydrochlorothiazide  50 x 12.5 mg daily.  Blood pressure is well-controlled today. Paroxysmal atrial fibrillation: Continue Eliquis  5 mg twice daily. Secondary hypercoagulable state: Continue Eliquis  5 mg twice daily.            Dispo:  Return in about 9 months (around 09/24/2024).      Medication Adjustments/Labs and Tests Ordered: Current medicines are reviewed at length with the patient today.  Concerns regarding medicines are outlined above.  The following changes have been made:  no change   Labs/tests ordered: Orders Placed This Encounter  Procedures   Lipid panel   Lipoprotein A (LPA)    Medication Changes: No orders of the defined types were placed in this encounter.   Current medicines are reviewed at length with the patient today.  The patient does not have concerns regarding medicines.  I spent 35 minutes reviewing all clinical data during  and prior to this visit including all relevant imaging studies, laboratories, clinical information from other health systems and prior notes from both Cardiology and other specialties, interviewing the patient, conducting a complete physical examination, and coordinating care in order to formulate a comprehensive and personalized evaluation and treatment plan.   History of Present Illness:      FOCUSED PROBLEM LIST:   Coronary artery disease  Moderate Nonobstructive; LHC 04/16/18: oRCA 40-50, apical LAD 70, patent LCx, EF > 50 >> Med Rx CCTA 02/07/21: CAC score 4.96, 69th percentile; dist to apical LAD 50-69 (small 1.3 cm in this location), pD1 0-24, LCx patent, oRCA 0-24 Myoview  03/05/23: EF 65, normal perfusion, low risk Paroxysmal atrial fibrillation  CV 2 score 4 >> apixiban TTE 11/03/2018: EF 55, NL wall motion, mild BAE, interatrial septum aneurysmal, mild TR Event monitor 11/28/20: NSR, Avg HR 65, no AFib, rare PVCs, 4 beat NSVT Hypertension  Hyperlipidemia  On Repatha  Intolerant of statins  Aortic atherosclerosis Chest CT 2019 Hypothyroidism   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 Patient is a 68 year old female with above listed medical problems here for routine cardiology follow-up.  The patient previously been cared for by Dr. Felipe Horton.  Patient last saw cardiology in October 2024.  She reported chest pain that seemed atypical.  She was doing well at that point in time.  No changes were made to her medical regimen.  Her LDL was 75 and she was referred to pharmacy.  She  was started on Zetia  in addition to her chronic Repatha .  She experiences easy bruising, which she attributes to the medication, but has no significant bleeding issues. She experiences palpitations a few times a week, which are brief and not particularly bothersome.  She experiences chest discomfort described as a 'squeezing' sensation when  upset, but no chest pain during exertion.  She exercises regularly, attending the gym three times a week, focusing on weight training and walking to manage her osteopenia.         Current Medications: Current Meds  Medication Sig   albuterol  (VENTOLIN  HFA) 108 (90 Base) MCG/ACT inhaler Inhale 2 puffs into the lungs every 4 (four) hours as needed for wheezing or shortness of breath.   amLODipine  (NORVASC ) 5 MG tablet Take 1 tablet (5 mg total) by mouth daily.   apixaban  (ELIQUIS ) 5 MG TABS tablet Take 1 tablet (5 mg total) by mouth 2 (two) times daily.   APPLE CIDER VINEGAR PO Take 1 tablet by mouth daily. Gummies   Cholecalciferol  (VITAMIN D3 PO) Take 1 tablet by mouth daily. Vit k2   diclofenac Sodium (VOLTAREN) 1 % GEL Apply 2 g topically daily as needed (pain).   Evolocumab  (REPATHA  SURECLICK) 140 MG/ML SOAJ Inject 140 mg into the skin every 14 (fourteen) days.   famotidine  (PEPCID ) 20 MG tablet One after supper   furosemide  (LASIX ) 20 MG tablet TAKE 1 TABLET BY MOUTH DAILY ONLY AS NEEDED FOR EDEMA   hydrOXYzine  (ATARAX /VISTARIL ) 25 MG tablet Take 25 mg by mouth daily as needed for itching.   ibuprofen  (ADVIL ) 800 MG tablet Take 800 mg by mouth 3 (three) times daily as needed.   levothyroxine  (SYNTHROID , LEVOTHROID) 75 MCG tablet Take 75 mcg by mouth daily before breakfast.    LINZESS 145 MCG CAPS capsule Take 145 mcg by mouth daily as needed (Constipation).   losartan-hydrochlorothiazide  (HYZAAR) 50-12.5 MG tablet Take 1 tablet by mouth daily.   Multiple Vitamin (MULTIVITAMIN WITH MINERALS) TABS Take 1 tablet by mouth daily.   nitroGLYCERIN  (NITROSTAT ) 0.4 MG SL tablet TAKE 1 TABLET BY MOUTH EVERY 5 MINUTES AS NEEDED FOR CHEST PAIN   pregabalin  (LYRICA ) 100 MG capsule Take 100 mg by mouth as needed (for pain).   pregabalin  (LYRICA ) 75 MG capsule Take 75 mg by mouth 3 (three) times daily as needed (Nerve pain).   traMADol  (ULTRAM ) 50 MG tablet Take 1 tablet (50 mg total) by mouth 2 (two)  times daily as needed for moderate pain.     Review of Systems:   Please see the history of present illness.    All other systems reviewed and are negative.     EKGs/Labs/Other Test Reviewed:   EKG: EKG from December 2024 demonstrates sinus rhythm with left anterior fascicular block  EKG Interpretation Date/Time:    Ventricular Rate:    PR Interval:    QRS Duration:    QT Interval:    QTC Calculation:   R Axis:      Text Interpretation:           Risk Assessment/Calculations:    CHA2DS2-VASc Score = 4   This indicates a 4.8% annual risk of stroke. The patient's score is based upon: CHF History: 0 HTN History: 1 Diabetes History: 0 Stroke History: 0 Vascular Disease History: 1 Age Score: 1 Gender Score: 1         Physical Exam:   VS:  BP 114/66   Pulse 74   Ht 5' 8.5" (1.74 m)  Wt 210 lb 12.8 oz (95.6 kg)   SpO2 97%   BMI 31.59 kg/m        Wt Readings from Last 3 Encounters:  12/24/23 210 lb 12.8 oz (95.6 kg)  09/27/23 212 lb (96.2 kg)  09/13/23 202 lb (91.6 kg)      GENERAL:  No apparent distress, AOx3 HEENT:  No carotid bruits, +2 carotid impulses, no scleral icterus CAR: RRR no murmurs, gallops, rubs, or thrills RES:  Clear to auscultation bilaterally ABD:  Soft, nontender, nondistended, positive bowel sounds x 4 VASC:  +2 radial pulses, +2 carotid pulses NEURO:  CN 2-12 grossly intact; motor and sensory grossly intact PSYCH:  No active depression or anxiety EXT:  No edema, ecchymosis, or cyanosis  Signed, Chiron Campione K Lillieanna Tuohy, MD  12/24/2023 8:52 AM    [facility] [address] [place], [place], [place]  [address] Phone: [phone]; Fax: [phone]   Note:  This document was prepared using Dragon voice recognition software and may include unintentional dictation errors.

## 2023-12-24 ENCOUNTER — Encounter: Payer: Self-pay | Admitting: Internal Medicine

## 2023-12-24 ENCOUNTER — Ambulatory Visit: Payer: BC Managed Care – PPO | Attending: Internal Medicine | Admitting: Internal Medicine

## 2023-12-24 VITALS — BP 114/66 | HR 74 | Ht 68.5 in | Wt 210.8 lb

## 2023-12-24 DIAGNOSIS — I10 Essential (primary) hypertension: Secondary | ICD-10-CM | POA: Diagnosis not present

## 2023-12-24 DIAGNOSIS — I700 Atherosclerosis of aorta: Secondary | ICD-10-CM | POA: Diagnosis not present

## 2023-12-24 DIAGNOSIS — D6869 Other thrombophilia: Secondary | ICD-10-CM

## 2023-12-24 DIAGNOSIS — I480 Paroxysmal atrial fibrillation: Secondary | ICD-10-CM

## 2023-12-24 DIAGNOSIS — E785 Hyperlipidemia, unspecified: Secondary | ICD-10-CM

## 2023-12-24 DIAGNOSIS — I2510 Atherosclerotic heart disease of native coronary artery without angina pectoris: Secondary | ICD-10-CM

## 2023-12-24 NOTE — Patient Instructions (Signed)
 Medication Instructions:  Your physician recommends that you continue on your current medications as directed. Please refer to the Current Medication list given to you today.  *If you need a refill on your cardiac medications before your next appointment, please call your pharmacy*  Lab Work: FLP and Lpa If you have labs (blood work) drawn today and your tests are completely normal, you will receive your results only by: MyChart Message (if you have MyChart) OR A paper copy in the mail If you have any lab test that is abnormal or we need to change your treatment, we will call you to review the results.  Testing/Procedures: NONE  Follow-Up: At [facility], you and your health needs are our priority.  As part of our continuing mission to provide you with exceptional heart care, our providers are all part of one team.  This team includes your primary Cardiologist (physician) and Advanced Practice Providers or APPs (Physician Assistants and Nurse Practitioners) who all work together to provide you with the care you need, when you need it.  Your next appointment:   9 month(s)  Provider:   Marlyse Single, PA-C  We recommend signing up for the patient portal called "MyChart".  Sign up information is provided on this After Visit Summary.  MyChart is used to connect with patients for Virtual Visits (Telemedicine).  Patients are able to view lab/test results, encounter notes, upcoming appointments, etc.  Non-urgent messages can be sent to your provider as well.   To learn more about what you can do with MyChart, go to [email or web address].   Other Instructions       1st Floor: - Lobby - Registration  - Pharmacy  - Lab - Cafe  2nd Floor: - PV Lab - Diagnostic Testing (echo, CT, nuclear med)  3rd Floor: - Vacant  4th Floor: - TCTS (cardiothoracic surgery) - AFib Clinic - Structural Heart Clinic - Vascular Surgery  - Vascular Ultrasound  5th Floor: - HeartCare  Cardiology (general and EP) - Clinical Pharmacy for coumadin, hypertension, lipid, weight-loss medications, and med management appointments    Valet parking services will be available as well.

## 2023-12-25 ENCOUNTER — Encounter: Payer: Self-pay | Admitting: Internal Medicine

## 2023-12-25 LAB — LIPID PANEL
Chol/HDL Ratio: 2.5 ratio (ref 0.0–4.4)
Cholesterol, Total: 164 mg/dL (ref 100–199)
HDL: 65 mg/dL (ref >39)
LDL Chol Calc (NIH): 79 mg/dL (ref 0–99)
Triglycerides: 116 mg/dL (ref 0–149)
VLDL Cholesterol Cal: 20 mg/dL (ref 5–40)

## 2023-12-25 LAB — LIPOPROTEIN A (LPA): Lipoprotein (a): 14.4 nmol/L (ref <75.0)

## 2023-12-26 DIAGNOSIS — G894 Chronic pain syndrome: Secondary | ICD-10-CM | POA: Diagnosis not present

## 2023-12-26 DIAGNOSIS — M25561 Pain in right knee: Secondary | ICD-10-CM | POA: Diagnosis not present

## 2023-12-26 DIAGNOSIS — M25562 Pain in left knee: Secondary | ICD-10-CM | POA: Diagnosis not present

## 2023-12-26 DIAGNOSIS — Z79891 Long term (current) use of opiate analgesic: Secondary | ICD-10-CM | POA: Diagnosis not present

## 2023-12-26 DIAGNOSIS — M170 Bilateral primary osteoarthritis of knee: Secondary | ICD-10-CM | POA: Diagnosis not present

## 2023-12-31 DIAGNOSIS — H1789 Other corneal scars and opacities: Secondary | ICD-10-CM | POA: Diagnosis not present

## 2023-12-31 DIAGNOSIS — H52213 Irregular astigmatism, bilateral: Secondary | ICD-10-CM | POA: Diagnosis not present

## 2023-12-31 DIAGNOSIS — H5213 Myopia, bilateral: Secondary | ICD-10-CM | POA: Diagnosis not present

## 2023-12-31 DIAGNOSIS — H524 Presbyopia: Secondary | ICD-10-CM | POA: Diagnosis not present

## 2023-12-31 DIAGNOSIS — H40013 Open angle with borderline findings, low risk, bilateral: Secondary | ICD-10-CM | POA: Diagnosis not present

## 2023-12-31 DIAGNOSIS — H35033 Hypertensive retinopathy, bilateral: Secondary | ICD-10-CM | POA: Diagnosis not present

## 2023-12-31 DIAGNOSIS — H25813 Combined forms of age-related cataract, bilateral: Secondary | ICD-10-CM | POA: Diagnosis not present

## 2024-01-02 DIAGNOSIS — M25562 Pain in left knee: Secondary | ICD-10-CM | POA: Diagnosis not present

## 2024-01-02 DIAGNOSIS — M25561 Pain in right knee: Secondary | ICD-10-CM | POA: Diagnosis not present

## 2024-01-07 ENCOUNTER — Encounter: Payer: Self-pay | Admitting: Podiatry

## 2024-01-07 NOTE — Telephone Encounter (Signed)
 pt left mess on my vmail wanting extension. called her back and lft mess on her vmail I will fax Alyce Baba revised RTW date 03/03/24. I faxed [phone]

## 2024-01-08 DIAGNOSIS — Z0271 Encounter for disability determination: Secondary | ICD-10-CM

## 2024-01-08 NOTE — Telephone Encounter (Signed)
 Faxed [place] [employer] forms/notes [phone]. RTW date was extended 03/03/24

## 2024-01-10 NOTE — Telephone Encounter (Signed)
 pt lft mess Alyce Baba needed why RTW was extended. I called and adv her notes were sent and Dr Lydia Sams has in his notes as to why.

## 2024-01-31 DIAGNOSIS — M25562 Pain in left knee: Secondary | ICD-10-CM | POA: Diagnosis not present

## 2024-01-31 DIAGNOSIS — M25561 Pain in right knee: Secondary | ICD-10-CM | POA: Diagnosis not present

## 2024-01-31 DIAGNOSIS — Z79891 Long term (current) use of opiate analgesic: Secondary | ICD-10-CM | POA: Diagnosis not present

## 2024-01-31 DIAGNOSIS — M170 Bilateral primary osteoarthritis of knee: Secondary | ICD-10-CM | POA: Diagnosis not present

## 2024-01-31 DIAGNOSIS — G894 Chronic pain syndrome: Secondary | ICD-10-CM | POA: Diagnosis not present

## 2024-02-04 ENCOUNTER — Ambulatory Visit (INDEPENDENT_AMBULATORY_CARE_PROVIDER_SITE_OTHER): Admitting: Internal Medicine

## 2024-02-04 ENCOUNTER — Encounter: Payer: Self-pay | Admitting: Internal Medicine

## 2024-02-04 VITALS — BP 110/70 | HR 80 | Temp 99.0°F | Ht 68.5 in | Wt 209.8 lb

## 2024-02-04 DIAGNOSIS — Z85831 Personal history of malignant neoplasm of soft tissue: Secondary | ICD-10-CM | POA: Diagnosis not present

## 2024-02-04 DIAGNOSIS — E039 Hypothyroidism, unspecified: Secondary | ICD-10-CM

## 2024-02-04 DIAGNOSIS — E782 Mixed hyperlipidemia: Secondary | ICD-10-CM | POA: Diagnosis not present

## 2024-02-04 DIAGNOSIS — I480 Paroxysmal atrial fibrillation: Secondary | ICD-10-CM | POA: Diagnosis not present

## 2024-02-04 DIAGNOSIS — I10 Essential (primary) hypertension: Secondary | ICD-10-CM

## 2024-02-04 DIAGNOSIS — I2510 Atherosclerotic heart disease of native coronary artery without angina pectoris: Secondary | ICD-10-CM | POA: Diagnosis not present

## 2024-02-04 DIAGNOSIS — C492 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, including hip: Secondary | ICD-10-CM

## 2024-02-04 NOTE — Progress Notes (Signed)
 New Patient Office Visit     CC/Reason for Visit: Establish care, discuss chronic medical conditions Previous PCP: Ronna Coho, MD Last Visit: Unknown  HPI: Whitney Daniel is a 68 y.o. female who is coming in today for the above mentioned reasons. 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  She has no acute concerns or complaints today.   Past Medical/Surgical History: Past Medical History:  Diagnosis Date   A-fib (HCC)    Anemia    CAD (coronary artery disease) 02/26/2023   Moderate Nonobstructive; LHC 04/16/18: oRCA 40-50, apical LAD 70, patent LCx, EF > 50 >> Med Rx // CCTA 02/07/21: CAC score 4.96, 69th percentile; dist to apical LAD 50-69 (small 1.3 cm in this location), pD1 0-24, LCx patent, oRCA 0-24  //  Lexiscan  Myoview  03/05/2023: EF 65, normal perfusion, low risk   Cancer (HCC)    Fibromyalgia    Hypertension    Hypothyroidism    Osteoarthritis    SVD (spontaneous vaginal delivery)    x 5    Past Surgical History:  Procedure Laterality Date   COLONOSCOPY     COLONOSCOPY WITH PROPOFOL  N/A 10/13/2022   Procedure: COLONOSCOPY WITH PROPOFOL ;  Surgeon: Alvis Jourdain, MD;  Location: [facility];  Service: Gastroenterology;  Laterality: N/A;   DILATATION & CURRETTAGE/HYSTEROSCOPY WITH RESECTOCOPE N/A 07/15/2014   Procedure: DILATATION & CURETTAGE/HYSTEROSCOPY WITH RESECTOCOPE;  Surgeon: Kandra Orn, MD;  Location: [facility];  Service: Gynecology;  Laterality: N/A;   DILATION AND CURETTAGE OF UTERUS     polyps   LEFT HEART CATH AND CORONARY ANGIOGRAPHY N/A 04/16/2018   Procedure: LEFT HEART CATH AND CORONARY ANGIOGRAPHY;  Surgeon: Arty Binning, MD;  Location: [facility];  Service: Cardiovascular;  Laterality: N/A;   right thigh surgery     cancer    WISDOM TOOTH EXTRACTION      Social History:  reports that she has never smoked. She has never used smokeless tobacco. She reports current alcohol  use. She reports that she does not use drugs.  Allergies: Allergies  Allergen Reactions   Codeine Other (See Comments)    Patient states "she did not like the way codeine made her feel"  Other Reaction(s): makes her feel bad    Family History:  Family History  Problem Relation Age of Onset   Hypertension Mother    CVA Mother    Cancer Father    Diabetes Father    Diabetes Sister    Heart attack Sister      Current Outpatient Medications:    albuterol  (VENTOLIN  HFA) 108 (90 Base) MCG/ACT inhaler, Inhale 2 puffs into the lungs every 4 (four) hours as needed for wheezing or shortness of breath., Disp: , Rfl:    amLODipine  (NORVASC ) 5 MG tablet, Take 1 tablet (5 mg total) by mouth daily., Disp: 90 tablet, Rfl: 2   apixaban  (ELIQUIS ) 5 MG TABS tablet, Take 1 tablet (5 mg total) by mouth 2 (two) times daily., Disp: 180 tablet, Rfl: 1   APPLE CIDER VINEGAR PO, Take 1 tablet by mouth daily. Gummies, Disp: , Rfl:    Cholecalciferol  (VITAMIN D3 PO), Take 1 tablet by mouth daily. Vit k2, Disp: , Rfl:    diclofenac Sodium (VOLTAREN) 1 % GEL, Apply 2 g topically daily as needed (pain)., Disp: , Rfl:    Evolocumab  (  REPATHA  SURECLICK) 140 MG/ML SOAJ, Inject 140 mg into the skin every 14 (fourteen) days., Disp: 6 mL, Rfl: 3   famotidine  (PEPCID ) 20 MG tablet, One after supper, Disp: 30 tablet, Rfl: 11   furosemide  (LASIX ) 20 MG tablet, TAKE 1 TABLET BY MOUTH DAILY ONLY AS NEEDED FOR EDEMA, Disp: 90 tablet, Rfl: 3   hydrOXYzine  (ATARAX /VISTARIL ) 25 MG tablet, Take 25 mg by mouth daily as needed for itching., Disp: , Rfl:    ibuprofen  (ADVIL ) 800 MG tablet, Take 800 mg by mouth 3 (three) times daily as needed., Disp: , Rfl:    levothyroxine  (SYNTHROID , LEVOTHROID) 75 MCG tablet, Take 75 mcg by mouth daily before breakfast. , Disp: , Rfl:     LINZESS 145 MCG CAPS capsule, Take 145 mcg by mouth daily as needed (Constipation)., Disp: , Rfl:    losartan-hydrochlorothiazide  (HYZAAR) 50-12.5 MG tablet, Take 1 tablet by mouth daily., Disp: , Rfl:    Multiple Vitamin (MULTIVITAMIN WITH MINERALS) TABS, Take 1 tablet by mouth daily., Disp: , Rfl:    nitroGLYCERIN  (NITROSTAT ) 0.4 MG SL tablet, TAKE 1 TABLET BY MOUTH EVERY 5 MINUTES AS NEEDED FOR CHEST PAIN, Disp: 25 tablet, Rfl: 5   pantoprazole  (PROTONIX ) 40 MG tablet, TAKE 1 TABLET (40 MG TOTAL) BY MOUTH DAILY. TAKE 30-60 MIN BEFORE FIRST MEAL OF THE DAY, Disp: 90 tablet, Rfl: 1   pregabalin  (LYRICA ) 100 MG capsule, Take 100 mg by mouth as needed (for pain)., Disp: , Rfl:    pregabalin  (LYRICA ) 75 MG capsule, Take 75 mg by mouth 3 (three) times daily as needed (Nerve pain)., Disp: , Rfl:    traMADol  (ULTRAM ) 50 MG tablet, Take 1 tablet (50 mg total) by mouth 2 (two) times daily as needed for moderate pain., Disp: 60 tablet, Rfl: 5   ezetimibe  (ZETIA ) 10 MG tablet, Take 1 tablet (10 mg total) by mouth daily. (Patient not taking: Reported on 02/04/2024), Disp: 90 tablet, Rfl: 3 No current facility-administered medications for this visit.  Facility-Administered Medications Ordered in Other Visits:    regadenoson  (LEXISCAN ) injection SOLN 0.4 mg, 0.4 mg, Intravenous, Once, Elmyra Haggard, MD   technetium tetrofosmin  (TC-MYOVIEW ) injection 32.4 millicurie, 32.4 millicurie, Intravenous, Once PRN, Elmyra Haggard, MD  Review of Systems:  Negative except as indicated in HPI.   Physical Exam: Vitals:   02/04/24 1252  BP: 110/70  Pulse: 80  Temp: 99 F (37.2 C)  TempSrc: Oral  SpO2: 98%  Weight: 209 lb 12.8 oz (95.2 kg)  Height: 5' 8.5" (1.74 m)  PF: 132 L/min   Body mass index is 31.44 kg/m.  Physical Exam Vitals reviewed.  Constitutional:      Appearance: Normal appearance.  HENT:     Head: Normocephalic and atraumatic.  Eyes:     Conjunctiva/sclera: Conjunctivae normal.     Pupils:  Pupils are equal, round, and reactive to light.  Cardiovascular:     Rate and Rhythm: Normal rate and regular rhythm.  Pulmonary:     Effort: Pulmonary effort is normal.     Breath sounds: Normal breath sounds.  Skin:    General: Skin is warm and dry.  Neurological:     General: No focal deficit present.     Mental Status: She is alert and oriented to person, place, and time.  Psychiatric:        Mood and Affect: Mood normal.        Behavior: Behavior normal.        Thought Content: Thought  content normal.        Judgment: Judgment normal.       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 - Last LDL was 90, she is not on a statin, unsure why.  Will discuss at subsequent visit unless addressed by cardiology. - Her last TSH was within normal range, continue current levothyroxine  dosing. - A-fib appears to be rate controlled, she is on Eliquis .  Time spent: 47 minutes reviewing chart, interviewing and examining patient and formulating plan of care.        Marguerita Shih, MD [facility]

## 2024-02-12 ENCOUNTER — Telehealth: Payer: Self-pay | Admitting: Podiatry

## 2024-02-12 DIAGNOSIS — Z0271 Encounter for disability determination: Secondary | ICD-10-CM

## 2024-02-12 NOTE — Telephone Encounter (Signed)
 pt left mess on my vmail. S/w pt and she wants RTW 04/04/24. She is still in pain. Advised her forms recd and faxed back [phone]

## 2024-02-28 DIAGNOSIS — G894 Chronic pain syndrome: Secondary | ICD-10-CM | POA: Diagnosis not present

## 2024-02-28 DIAGNOSIS — M170 Bilateral primary osteoarthritis of knee: Secondary | ICD-10-CM | POA: Diagnosis not present

## 2024-02-28 DIAGNOSIS — M25562 Pain in left knee: Secondary | ICD-10-CM | POA: Diagnosis not present

## 2024-02-28 DIAGNOSIS — Z79891 Long term (current) use of opiate analgesic: Secondary | ICD-10-CM | POA: Diagnosis not present

## 2024-02-28 DIAGNOSIS — M25561 Pain in right knee: Secondary | ICD-10-CM | POA: Diagnosis not present

## 2024-03-27 ENCOUNTER — Telehealth: Payer: Self-pay | Admitting: Podiatry

## 2024-03-27 DIAGNOSIS — Z0271 Encounter for disability determination: Secondary | ICD-10-CM

## 2024-03-27 NOTE — Telephone Encounter (Signed)
 Recd [place] forms for pt. Faxed [phone] form/notes. RTW 04/04/24

## 2024-03-28 ENCOUNTER — Encounter: Payer: Self-pay | Admitting: Podiatry

## 2024-04-02 ENCOUNTER — Ambulatory Visit: Admitting: Podiatry

## 2024-04-02 ENCOUNTER — Telehealth: Payer: Self-pay | Admitting: Podiatry

## 2024-04-02 DIAGNOSIS — M25562 Pain in left knee: Secondary | ICD-10-CM | POA: Diagnosis not present

## 2024-04-02 DIAGNOSIS — M25561 Pain in right knee: Secondary | ICD-10-CM | POA: Diagnosis not present

## 2024-04-02 NOTE — Telephone Encounter (Signed)
 called Whitney Daniel. she said needed notes/form sent again to [facility]. I corrected RTW on forms from 7/24.25 and refaxed and sent Whitney copy via email.

## 2024-04-11 ENCOUNTER — Ambulatory Visit: Admitting: Podiatry

## 2024-04-15 ENCOUNTER — Telehealth: Payer: Self-pay | Admitting: Podiatry

## 2024-04-15 NOTE — Telephone Encounter (Signed)
 pt lft mess and I called her back. Her foot is still not getting better and feels she can't work. I emailed her my fax# so she can give for any PW we may have to complete for her.

## 2024-04-22 DIAGNOSIS — M25562 Pain in left knee: Secondary | ICD-10-CM | POA: Diagnosis not present

## 2024-04-22 DIAGNOSIS — G894 Chronic pain syndrome: Secondary | ICD-10-CM | POA: Diagnosis not present

## 2024-04-22 DIAGNOSIS — Z79891 Long term (current) use of opiate analgesic: Secondary | ICD-10-CM | POA: Diagnosis not present

## 2024-04-22 DIAGNOSIS — M170 Bilateral primary osteoarthritis of knee: Secondary | ICD-10-CM | POA: Diagnosis not present

## 2024-04-22 DIAGNOSIS — M25561 Pain in right knee: Secondary | ICD-10-CM | POA: Diagnosis not present

## 2024-04-23 ENCOUNTER — Ambulatory Visit: Admitting: Podiatry

## 2024-05-16 ENCOUNTER — Other Ambulatory Visit (HOSPITAL_COMMUNITY): Payer: Self-pay

## 2024-05-16 ENCOUNTER — Telehealth: Payer: Self-pay | Admitting: Pharmacy Technician

## 2024-05-16 ENCOUNTER — Other Ambulatory Visit: Payer: Self-pay | Admitting: Internal Medicine

## 2024-05-16 DIAGNOSIS — E785 Hyperlipidemia, unspecified: Secondary | ICD-10-CM

## 2024-05-16 DIAGNOSIS — I2510 Atherosclerotic heart disease of native coronary artery without angina pectoris: Secondary | ICD-10-CM

## 2024-05-16 NOTE — Telephone Encounter (Signed)
 Pharmacy Patient Advocate Encounter   Received notification from Fax that prior authorization for Repatha  is required/requested.   Insurance verification completed.   The patient is insured through [employer] .   Per test claim: PA required; PA submitted to above mentioned insurance via Latent Key/confirmation #/EOC [id number] Status is pending

## 2024-05-16 NOTE — Telephone Encounter (Signed)
 Pharmacy Patient Advocate Encounter  Received notification from [facility] that Prior Authorization for Repatha  has been APPROVED from 05/16/24 to 11/13/24. Ran test claim, Copay is $436.72- one month. This test claim was processed through [facility]- copay amounts may vary at other pharmacies due to pharmacy/plan contracts, or as the patient moves through the different stages of their insurance plan.   PA #/Case ID/Reference #: [id number]

## 2024-05-20 DIAGNOSIS — G894 Chronic pain syndrome: Secondary | ICD-10-CM | POA: Diagnosis not present

## 2024-05-20 DIAGNOSIS — M25562 Pain in left knee: Secondary | ICD-10-CM | POA: Diagnosis not present

## 2024-05-20 DIAGNOSIS — M170 Bilateral primary osteoarthritis of knee: Secondary | ICD-10-CM | POA: Diagnosis not present

## 2024-05-20 DIAGNOSIS — M25561 Pain in right knee: Secondary | ICD-10-CM | POA: Diagnosis not present

## 2024-05-20 DIAGNOSIS — Z79891 Long term (current) use of opiate analgesic: Secondary | ICD-10-CM | POA: Diagnosis not present

## 2024-06-18 ENCOUNTER — Ambulatory Visit: Admitting: Podiatry

## 2024-06-18 DIAGNOSIS — M19079 Primary osteoarthritis, unspecified ankle and foot: Secondary | ICD-10-CM

## 2024-06-18 DIAGNOSIS — M19071 Primary osteoarthritis, right ankle and foot: Secondary | ICD-10-CM | POA: Diagnosis not present

## 2024-06-18 NOTE — Progress Notes (Signed)
 Subjective:  Patient ID: Whitney Daniel, female    DOB: 28-Sep-1955,  MRN: [id number]  Chief Complaint  Patient presents with   Injections    Pt is wanting to get an injection     68 y.o. female presents with the above complaint.  Patient presents for follow-up of right talonavicular joint pain.  She states she would like to do an injection as that has helped a lot.  Denies any other acute complaints  Review of Systems: Negative except as noted in the HPI. Denies N/V/F/Ch.  Past Medical History:  Diagnosis Date   A-fib (HCC)    Anemia    CAD (coronary artery disease) 02/26/2023   Moderate Nonobstructive; LHC 04/16/18: oRCA 40-50, apical LAD 70, patent LCx, EF > 50 >> Med Rx // CCTA 02/07/21: CAC score 4.96, 69th percentile; dist to apical LAD 50-69 (small 1.3 cm in this location), pD1 0-24, LCx patent, oRCA 0-24  //  Lexiscan  Myoview  03/05/2023: EF 65, normal perfusion, low risk   Cancer (HCC)    Fibromyalgia    Hypertension    Hypothyroidism    Osteoarthritis    SVD (spontaneous vaginal delivery)    x 5    Current Outpatient Medications:    albuterol  (VENTOLIN  HFA) 108 (90 Base) MCG/ACT inhaler, Inhale 2 puffs into the lungs every 4 (four) hours as needed for wheezing or shortness of breath., Disp: , Rfl:    amLODipine  (NORVASC ) 5 MG tablet, Take 1 tablet (5 mg total) by mouth daily., Disp: 90 tablet, Rfl: 2   apixaban  (ELIQUIS ) 5 MG TABS tablet, Take 1 tablet (5 mg total) by mouth 2 (two) times daily., Disp: 180 tablet, Rfl: 1   APPLE CIDER VINEGAR PO, Take 1 tablet by mouth daily. Gummies, Disp: , Rfl:    Cholecalciferol  (VITAMIN D3 PO), Take 1 tablet by mouth daily. Vit k2, Disp: , Rfl:    diclofenac Sodium (VOLTAREN) 1 % GEL, Apply 2 g topically daily as needed (pain)., Disp: , Rfl:    Evolocumab  (REPATHA  SURECLICK) 140 MG/ML SOAJ, INJECT 140 MG INTO THE SKIN EVERY 14 (FOURTEEN) DAYS., Disp: 6 mL, Rfl: 1   ezetimibe  (ZETIA ) 10 MG tablet, Take 1 tablet (10 mg total) by mouth  daily. (Patient not taking: Reported on 02/04/2024), Disp: 90 tablet, Rfl: 3   famotidine  (PEPCID ) 20 MG tablet, One after supper, Disp: 30 tablet, Rfl: 11   furosemide  (LASIX ) 20 MG tablet, TAKE 1 TABLET BY MOUTH DAILY ONLY AS NEEDED FOR EDEMA, Disp: 90 tablet, Rfl: 3   hydrOXYzine  (ATARAX /VISTARIL ) 25 MG tablet, Take 25 mg by mouth daily as needed for itching., Disp: , Rfl:    ibuprofen  (ADVIL ) 800 MG tablet, Take 800 mg by mouth 3 (three) times daily as needed., Disp: , Rfl:    levothyroxine  (SYNTHROID , LEVOTHROID) 75 MCG tablet, Take 75 mcg by mouth daily before breakfast. , Disp: , Rfl:    LINZESS 145 MCG CAPS capsule, Take 145 mcg by mouth daily as needed (Constipation)., Disp: , Rfl:    losartan-hydrochlorothiazide  (HYZAAR) 50-12.5 MG tablet, Take 1 tablet by mouth daily., Disp: , Rfl:    Multiple Vitamin (MULTIVITAMIN WITH MINERALS) TABS, Take 1 tablet by mouth daily., Disp: , Rfl:    nitroGLYCERIN  (NITROSTAT ) 0.4 MG SL tablet, TAKE 1 TABLET BY MOUTH EVERY 5 MINUTES AS NEEDED FOR CHEST PAIN, Disp: 25 tablet, Rfl: 5   pantoprazole  (PROTONIX ) 40 MG tablet, TAKE 1 TABLET (40 MG TOTAL) BY MOUTH DAILY. TAKE 30-60 MIN BEFORE FIRST MEAL  OF THE DAY, Disp: 90 tablet, Rfl: 1   pregabalin  (LYRICA ) 100 MG capsule, Take 100 mg by mouth as needed (for pain)., Disp: , Rfl:    pregabalin  (LYRICA ) 75 MG capsule, Take 75 mg by mouth 3 (three) times daily as needed (Nerve pain)., Disp: , Rfl:    traMADol  (ULTRAM ) 50 MG tablet, Take 1 tablet (50 mg total) by mouth 2 (two) times daily as needed for moderate pain., Disp: 60 tablet, Rfl: 5 No current facility-administered medications for this visit.  Facility-Administered Medications Ordered in Other Visits:    regadenoson  (LEXISCAN ) injection SOLN 0.4 mg, 0.4 mg, Intravenous, Once, Okey Vina GAILS, MD   technetium tetrofosmin  (TC-MYOVIEW ) injection 32.4 millicurie, 32.4 millicurie, Intravenous, Once PRN, Okey Vina GAILS, MD  Social History   Tobacco Use  Smoking  Status Never  Smokeless Tobacco Never    Allergies  Allergen Reactions   Codeine Other (See Comments)    Patient states she did not like the way codeine made her feel  Other Reaction(s): makes her feel bad   Objective:   There were no vitals filed for this visit.  There is no height or weight on file to calculate BMI. Constitutional Well developed. Well nourished.  Vascular Dorsalis pedis pulses palpable bilaterally. Posterior tibial pulses palpable bilaterally. Capillary refill normal to all digits.  No cyanosis or clubbing noted. Pedal hair growth normal.  Neurologic Normal speech. Oriented to person, place, and time. Epicritic sensation to light touch grossly present bilaterally.  Dermatologic Nails well groomed and normal in appearance. No open wounds. No skin lesions.  Orthopedic: Negative anterior drawer test or talar tilt test noted.  Limited range of motion noted at the ankle joint no deep intra-articular ankle pain noted.  Pain with dorsiflexion of the ankle joint likely due to the impingement of the anterior ankle.  Pain at the talonavicular joint.  Clinically able to appreciate osteophytes at the TN joint   Radiographs: IMPRESSION: 1. Nonvisualization of the anterior talofibular ligament with edema about the expected location suggesting tear, likely a chronic process. 2. Edema about the spring ligament suggesting chronic ligamentous sprain. 3. Osteochondral injury about the medial aspect of the talar dome. 4. Moderate osteoarthritis of the talonavicular joint with subchondral cystic changes and marrow edema. 5. Mild subchondral edema of the cuboid suggesting mild arthritis. 6. Subcutaneous soft tissue edema about the ankle and dorsum of the foot without fluid collection or hematoma.   Assessment:   No diagnosis found.   Plan:  Patient was evaluated and treated and all questions answered.  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  Continue using cam boot but would like for transition to Tri-Lock ankle brace. -MRI was reviewed with the patient in extensive detail.  At this time clinically correlating her pain most of her pain appears to be at the talonavicular joint with moderate osteoarthritis present.  The osteochondral injury is not bothering her at the ankle joint as her pain is more at the anterior dorsal foot near the ankle. -another steroid injection was performed at right talonavicular joint using 1% plain Lidocaine  and 10 mg of Kenalog . This was well tolerated. -Patient will think about PRP versus surgical fusion. -She would like some time to think over from surgical fusion -Will extend her disability indefinitely until clinical resolved    No follow-ups on file.

## 2024-06-19 DIAGNOSIS — M25562 Pain in left knee: Secondary | ICD-10-CM | POA: Diagnosis not present

## 2024-06-19 DIAGNOSIS — M25561 Pain in right knee: Secondary | ICD-10-CM | POA: Diagnosis not present

## 2024-07-09 NOTE — Progress Notes (Addendum)
 Whitney Daniel                                          MRN: [id number]   08/19/2024   The [facility]BCI Quality Team Specialist reviewed this patient medical record for the purposes of chart review for care gap closure. The following were reviewed: chart review for care gap closure-glycemic status assessment and kidney health evaluation for diabetes:eGFR  and uACR.    VBCI Quality Team

## 2024-08-28 ENCOUNTER — Other Ambulatory Visit: Payer: Self-pay | Admitting: Internal Medicine

## 2024-08-28 DIAGNOSIS — I480 Paroxysmal atrial fibrillation: Secondary | ICD-10-CM

## 2024-08-29 ENCOUNTER — Telehealth: Payer: Self-pay | Admitting: Internal Medicine

## 2024-08-29 DIAGNOSIS — I2510 Atherosclerotic heart disease of native coronary artery without angina pectoris: Secondary | ICD-10-CM

## 2024-08-29 DIAGNOSIS — E785 Hyperlipidemia, unspecified: Secondary | ICD-10-CM

## 2024-08-29 MED ORDER — REPATHA SURECLICK 140 MG/ML ~~LOC~~ SOAJ: 140.0000 mg | SUBCUTANEOUS | 1 refills | Status: AC

## 2024-08-29 NOTE — Telephone Encounter (Signed)
 Scr 0.91 on 10/12/23 in [facility] tab. Rx refilled

## 2024-08-29 NOTE — Telephone Encounter (Signed)
 Prescription refill request for Eliquis  received. Indication: A-Fib Last office visit: 12/24/23 Scr: 0.88 08/16/23 Care Everywhere Age: 68 Weight: 95.2 KG Pt has passed all Parameters as of last BMP done in 2024.

## 2024-08-29 NOTE — Telephone Encounter (Signed)
" °*  STAT* If patient is at the pharmacy, call can be transferred to refill team.   1. Which medications need to be refilled? (please list name of each medication and dose if known)   Evolocumab  (REPATHA  SURECLICK) 140 MG/ML SOAJ   2. Would you like to learn more about the convenience, safety, & potential cost savings by using the [facility]?   3. Are you open to using the [facility] (Type [facility]. ).  4. Which pharmacy/location (including street and city if local pharmacy) is medication to be sent to?  [facility] [place], [place] - [address]   5. Do they need a 30 day or 90 day supply?  30 day  Patient stated she is completely out of this medication.  Patient has appointment with Dr. Wendel on 3/10.    "

## 2024-09-10 ENCOUNTER — Ambulatory Visit: Admitting: Podiatry

## 2024-11-11 ENCOUNTER — Ambulatory Visit: Admitting: Internal Medicine

## 2024-11-12 ENCOUNTER — Encounter: Admitting: Internal Medicine
